# Patient Record
Sex: Male | Born: 1955
Health system: Southern US, Community
[De-identification: ages and names within clinical notes are randomized; demographics above are authoritative.]

## PROBLEM LIST (undated history)

## (undated) DIAGNOSIS — D649 Anemia, unspecified: Secondary | ICD-10-CM

## (undated) DIAGNOSIS — I2510 Atherosclerotic heart disease of native coronary artery without angina pectoris: Secondary | ICD-10-CM

## (undated) DIAGNOSIS — I4901 Ventricular fibrillation: Secondary | ICD-10-CM

## (undated) DIAGNOSIS — H9190 Unspecified hearing loss, unspecified ear: Secondary | ICD-10-CM

## (undated) DIAGNOSIS — I10 Essential (primary) hypertension: Secondary | ICD-10-CM

## (undated) DIAGNOSIS — Z8614 Personal history of Methicillin resistant Staphylococcus aureus infection: Secondary | ICD-10-CM

## (undated) DIAGNOSIS — I319 Disease of pericardium, unspecified: Secondary | ICD-10-CM

## (undated) DIAGNOSIS — E785 Hyperlipidemia, unspecified: Secondary | ICD-10-CM

## (undated) DIAGNOSIS — I255 Ischemic cardiomyopathy: Secondary | ICD-10-CM

## (undated) DIAGNOSIS — E278 Other specified disorders of adrenal gland: Secondary | ICD-10-CM

## (undated) DIAGNOSIS — Z951 Presence of aortocoronary bypass graft: Secondary | ICD-10-CM

## (undated) HISTORY — DX: Ventricular fibrillation: I49.01

## (undated) HISTORY — DX: Ischemic cardiomyopathy: I25.5

## (undated) HISTORY — DX: Personal history of Methicillin resistant Staphylococcus aureus infection: Z86.14

## (undated) HISTORY — DX: Other specified disorders of adrenal gland: E27.8

## (undated) HISTORY — DX: Essential (primary) hypertension: I10

## (undated) HISTORY — DX: Anemia, unspecified: D64.9

## (undated) HISTORY — DX: Atherosclerotic heart disease of native coronary artery without angina pectoris: I25.10

## (undated) HISTORY — PX: ANKLE SURGERY: SHX546

## (undated) HISTORY — DX: Disease of pericardium, unspecified: I31.9

## (undated) HISTORY — DX: Presence of aortocoronary bypass graft: Z95.1

## (undated) HISTORY — DX: Hyperlipidemia, unspecified: E78.5

## (undated) HISTORY — DX: Unspecified hearing loss, unspecified ear: H91.90

---

## 1998-08-14 ENCOUNTER — Encounter: Payer: Self-pay | Admitting: Orthopedic Surgery

## 1998-08-14 ENCOUNTER — Ambulatory Visit (HOSPITAL_COMMUNITY): Admission: RE | Admit: 1998-08-14 | Discharge: 1998-08-14 | Payer: Self-pay | Admitting: Orthopedic Surgery

## 2004-10-19 ENCOUNTER — Ambulatory Visit (HOSPITAL_COMMUNITY): Admission: RE | Admit: 2004-10-19 | Discharge: 2004-10-19 | Payer: Self-pay | Admitting: Family Medicine

## 2004-12-13 ENCOUNTER — Ambulatory Visit: Payer: Self-pay | Admitting: Orthopedic Surgery

## 2005-01-28 ENCOUNTER — Ambulatory Visit: Payer: Self-pay | Admitting: Orthopedic Surgery

## 2005-04-24 ENCOUNTER — Ambulatory Visit: Payer: Self-pay | Admitting: Orthopedic Surgery

## 2006-04-04 ENCOUNTER — Emergency Department (HOSPITAL_COMMUNITY): Admission: EM | Admit: 2006-04-04 | Discharge: 2006-04-04 | Payer: Self-pay | Admitting: Emergency Medicine

## 2008-08-26 ENCOUNTER — Encounter: Payer: Self-pay | Admitting: Emergency Medicine

## 2008-08-26 ENCOUNTER — Inpatient Hospital Stay (HOSPITAL_COMMUNITY): Admission: EM | Admit: 2008-08-26 | Discharge: 2008-08-30 | Payer: Self-pay | Admitting: Cardiology

## 2008-08-26 HISTORY — PX: CARDIAC CATHETERIZATION: SHX172

## 2008-08-29 ENCOUNTER — Encounter: Payer: Self-pay | Admitting: Cardiology

## 2008-09-02 ENCOUNTER — Ambulatory Visit: Payer: Self-pay | Admitting: Internal Medicine

## 2008-09-02 ENCOUNTER — Other Ambulatory Visit: Payer: Self-pay | Admitting: Emergency Medicine

## 2008-09-02 ENCOUNTER — Inpatient Hospital Stay (HOSPITAL_COMMUNITY): Admission: EM | Admit: 2008-09-02 | Discharge: 2008-09-09 | Payer: Self-pay | Admitting: Cardiology

## 2008-09-02 DIAGNOSIS — I4901 Ventricular fibrillation: Secondary | ICD-10-CM

## 2008-09-02 HISTORY — DX: Ventricular fibrillation: I49.01

## 2008-09-15 ENCOUNTER — Encounter: Admission: RE | Admit: 2008-09-15 | Discharge: 2008-09-15 | Payer: Self-pay | Admitting: Cardiology

## 2008-09-26 ENCOUNTER — Encounter (INDEPENDENT_AMBULATORY_CARE_PROVIDER_SITE_OTHER): Payer: Self-pay | Admitting: *Deleted

## 2008-09-26 ENCOUNTER — Ambulatory Visit: Payer: Self-pay

## 2008-12-14 DIAGNOSIS — I472 Ventricular tachycardia: Secondary | ICD-10-CM | POA: Insufficient documentation

## 2008-12-14 DIAGNOSIS — G931 Anoxic brain damage, not elsewhere classified: Secondary | ICD-10-CM | POA: Insufficient documentation

## 2008-12-14 DIAGNOSIS — I469 Cardiac arrest, cause unspecified: Secondary | ICD-10-CM | POA: Insufficient documentation

## 2008-12-14 DIAGNOSIS — E876 Hypokalemia: Secondary | ICD-10-CM

## 2008-12-14 DIAGNOSIS — I10 Essential (primary) hypertension: Secondary | ICD-10-CM | POA: Insufficient documentation

## 2008-12-14 DIAGNOSIS — I2510 Atherosclerotic heart disease of native coronary artery without angina pectoris: Secondary | ICD-10-CM | POA: Insufficient documentation

## 2008-12-14 DIAGNOSIS — E7800 Pure hypercholesterolemia, unspecified: Secondary | ICD-10-CM

## 2008-12-14 DIAGNOSIS — I509 Heart failure, unspecified: Secondary | ICD-10-CM | POA: Insufficient documentation

## 2008-12-14 DIAGNOSIS — I5022 Chronic systolic (congestive) heart failure: Secondary | ICD-10-CM

## 2008-12-14 DIAGNOSIS — F17200 Nicotine dependence, unspecified, uncomplicated: Secondary | ICD-10-CM

## 2008-12-16 ENCOUNTER — Ambulatory Visit: Payer: Self-pay | Admitting: Internal Medicine

## 2008-12-16 ENCOUNTER — Encounter: Payer: Self-pay | Admitting: Internal Medicine

## 2009-01-16 ENCOUNTER — Encounter: Payer: Self-pay | Admitting: Internal Medicine

## 2009-02-19 ENCOUNTER — Emergency Department (HOSPITAL_COMMUNITY): Admission: EM | Admit: 2009-02-19 | Discharge: 2009-02-19 | Payer: Self-pay | Admitting: Emergency Medicine

## 2009-02-20 ENCOUNTER — Inpatient Hospital Stay (HOSPITAL_COMMUNITY): Admission: EM | Admit: 2009-02-20 | Discharge: 2009-03-02 | Payer: Self-pay | Admitting: Emergency Medicine

## 2009-02-21 ENCOUNTER — Ambulatory Visit: Payer: Self-pay | Admitting: Internal Medicine

## 2009-02-21 ENCOUNTER — Ambulatory Visit: Payer: Self-pay | Admitting: Cardiology

## 2009-02-21 ENCOUNTER — Encounter (INDEPENDENT_AMBULATORY_CARE_PROVIDER_SITE_OTHER): Payer: Self-pay | Admitting: Internal Medicine

## 2009-02-22 ENCOUNTER — Encounter: Payer: Self-pay | Admitting: Internal Medicine

## 2009-02-22 ENCOUNTER — Ambulatory Visit: Payer: Self-pay | Admitting: Oncology

## 2009-02-22 ENCOUNTER — Ambulatory Visit: Payer: Self-pay | Admitting: Infectious Disease

## 2009-02-23 HISTORY — PX: OTHER SURGICAL HISTORY: SHX169

## 2009-03-11 ENCOUNTER — Emergency Department (HOSPITAL_COMMUNITY): Admission: EM | Admit: 2009-03-11 | Discharge: 2009-03-11 | Payer: Self-pay | Admitting: Emergency Medicine

## 2009-03-13 ENCOUNTER — Encounter: Payer: Self-pay | Admitting: Infectious Disease

## 2009-03-16 ENCOUNTER — Inpatient Hospital Stay (HOSPITAL_COMMUNITY): Admission: EM | Admit: 2009-03-16 | Discharge: 2009-03-19 | Payer: Self-pay | Admitting: Cardiology

## 2009-03-16 ENCOUNTER — Encounter (INDEPENDENT_AMBULATORY_CARE_PROVIDER_SITE_OTHER): Payer: Self-pay | Admitting: Cardiology

## 2009-03-16 ENCOUNTER — Encounter: Payer: Self-pay | Admitting: Emergency Medicine

## 2009-03-23 ENCOUNTER — Encounter: Payer: Self-pay | Admitting: Internal Medicine

## 2009-03-28 ENCOUNTER — Encounter: Payer: Self-pay | Admitting: Infectious Disease

## 2009-03-29 ENCOUNTER — Telehealth: Payer: Self-pay | Admitting: Infectious Disease

## 2009-04-03 ENCOUNTER — Encounter: Payer: Self-pay | Admitting: Infectious Disease

## 2009-04-04 ENCOUNTER — Telehealth: Payer: Self-pay | Admitting: Infectious Disease

## 2009-04-05 ENCOUNTER — Encounter: Payer: Self-pay | Admitting: Infectious Disease

## 2009-04-05 DIAGNOSIS — R7881 Bacteremia: Secondary | ICD-10-CM

## 2009-04-05 DIAGNOSIS — I309 Acute pericarditis, unspecified: Secondary | ICD-10-CM

## 2009-04-06 ENCOUNTER — Encounter: Payer: Self-pay | Admitting: Infectious Disease

## 2009-04-17 ENCOUNTER — Ambulatory Visit: Payer: Self-pay | Admitting: Internal Medicine

## 2009-04-17 LAB — CONVERTED CEMR LAB
Eosinophils Absolute: 0.2 10*3/uL (ref 0.0–0.7)
Eosinophils Relative: 1.6 % (ref 0.0–5.0)
HCT: 37.6 % — ABNORMAL LOW (ref 39.0–52.0)
Lymphs Abs: 2.1 10*3/uL (ref 0.7–4.0)
MCHC: 33.5 g/dL (ref 30.0–36.0)
MCV: 94.5 fL (ref 78.0–100.0)
Monocytes Absolute: 0.4 10*3/uL (ref 0.1–1.0)
Platelets: 408 10*3/uL — ABNORMAL HIGH (ref 150.0–400.0)
WBC: 10.6 10*3/uL — ABNORMAL HIGH (ref 4.5–10.5)

## 2009-04-18 ENCOUNTER — Encounter: Payer: Self-pay | Admitting: Internal Medicine

## 2009-04-25 ENCOUNTER — Encounter: Payer: Self-pay | Admitting: Internal Medicine

## 2009-04-25 ENCOUNTER — Encounter: Payer: Self-pay | Admitting: Infectious Disease

## 2009-04-27 ENCOUNTER — Ambulatory Visit: Payer: Self-pay | Admitting: Infectious Disease

## 2009-04-27 DIAGNOSIS — A4101 Sepsis due to Methicillin susceptible Staphylococcus aureus: Secondary | ICD-10-CM

## 2009-04-27 DIAGNOSIS — T827XXA Infection and inflammatory reaction due to other cardiac and vascular devices, implants and grafts, initial encounter: Secondary | ICD-10-CM

## 2009-05-05 ENCOUNTER — Ambulatory Visit: Payer: Self-pay | Admitting: Internal Medicine

## 2009-05-05 LAB — CONVERTED CEMR LAB
BUN: 10 mg/dL (ref 6–23)
Calcium: 9.3 mg/dL (ref 8.4–10.5)
GFR calc non Af Amer: 108.09 mL/min (ref >60)
Glucose, Bld: 83 mg/dL (ref 70–99)
HCT: 39.4 % (ref 39.0–52.0)
INR: 1.1 — ABNORMAL HIGH (ref 0.8–1.0)
MCV: 93.3 fL (ref 78.0–100.0)
Platelets: 321 10*3/uL (ref 150.0–400.0)
RDW: 14.4 % (ref 11.5–14.6)
Sodium: 140 meq/L (ref 135–145)
aPTT: 32.2 s — ABNORMAL HIGH (ref 21.7–28.8)

## 2009-05-09 ENCOUNTER — Ambulatory Visit: Payer: Self-pay | Admitting: Internal Medicine

## 2009-05-09 ENCOUNTER — Inpatient Hospital Stay (HOSPITAL_COMMUNITY): Admission: RE | Admit: 2009-05-09 | Discharge: 2009-05-10 | Payer: Self-pay | Admitting: Internal Medicine

## 2009-05-09 ENCOUNTER — Encounter: Payer: Self-pay | Admitting: Infectious Disease

## 2009-05-09 HISTORY — PX: OTHER SURGICAL HISTORY: SHX169

## 2009-05-10 ENCOUNTER — Encounter: Payer: Self-pay | Admitting: Internal Medicine

## 2009-05-24 ENCOUNTER — Encounter: Payer: Self-pay | Admitting: Internal Medicine

## 2009-05-24 ENCOUNTER — Ambulatory Visit: Payer: Self-pay

## 2009-07-27 ENCOUNTER — Encounter: Payer: Self-pay | Admitting: Internal Medicine

## 2009-07-28 ENCOUNTER — Encounter: Payer: Self-pay | Admitting: Internal Medicine

## 2009-08-09 ENCOUNTER — Ambulatory Visit: Payer: Self-pay | Admitting: Internal Medicine

## 2009-11-17 ENCOUNTER — Encounter (INDEPENDENT_AMBULATORY_CARE_PROVIDER_SITE_OTHER): Payer: Self-pay | Admitting: *Deleted

## 2010-02-28 IMAGING — CR DG CHEST 1V PORT
1 series · 1 of 1 positions shown · non-contrast
Comparison: Radiograph 09/02/2008

CLINICAL DATA: Cardiac arrest

PORTABLE CHEST - 1 VIEW

[view not recorded]
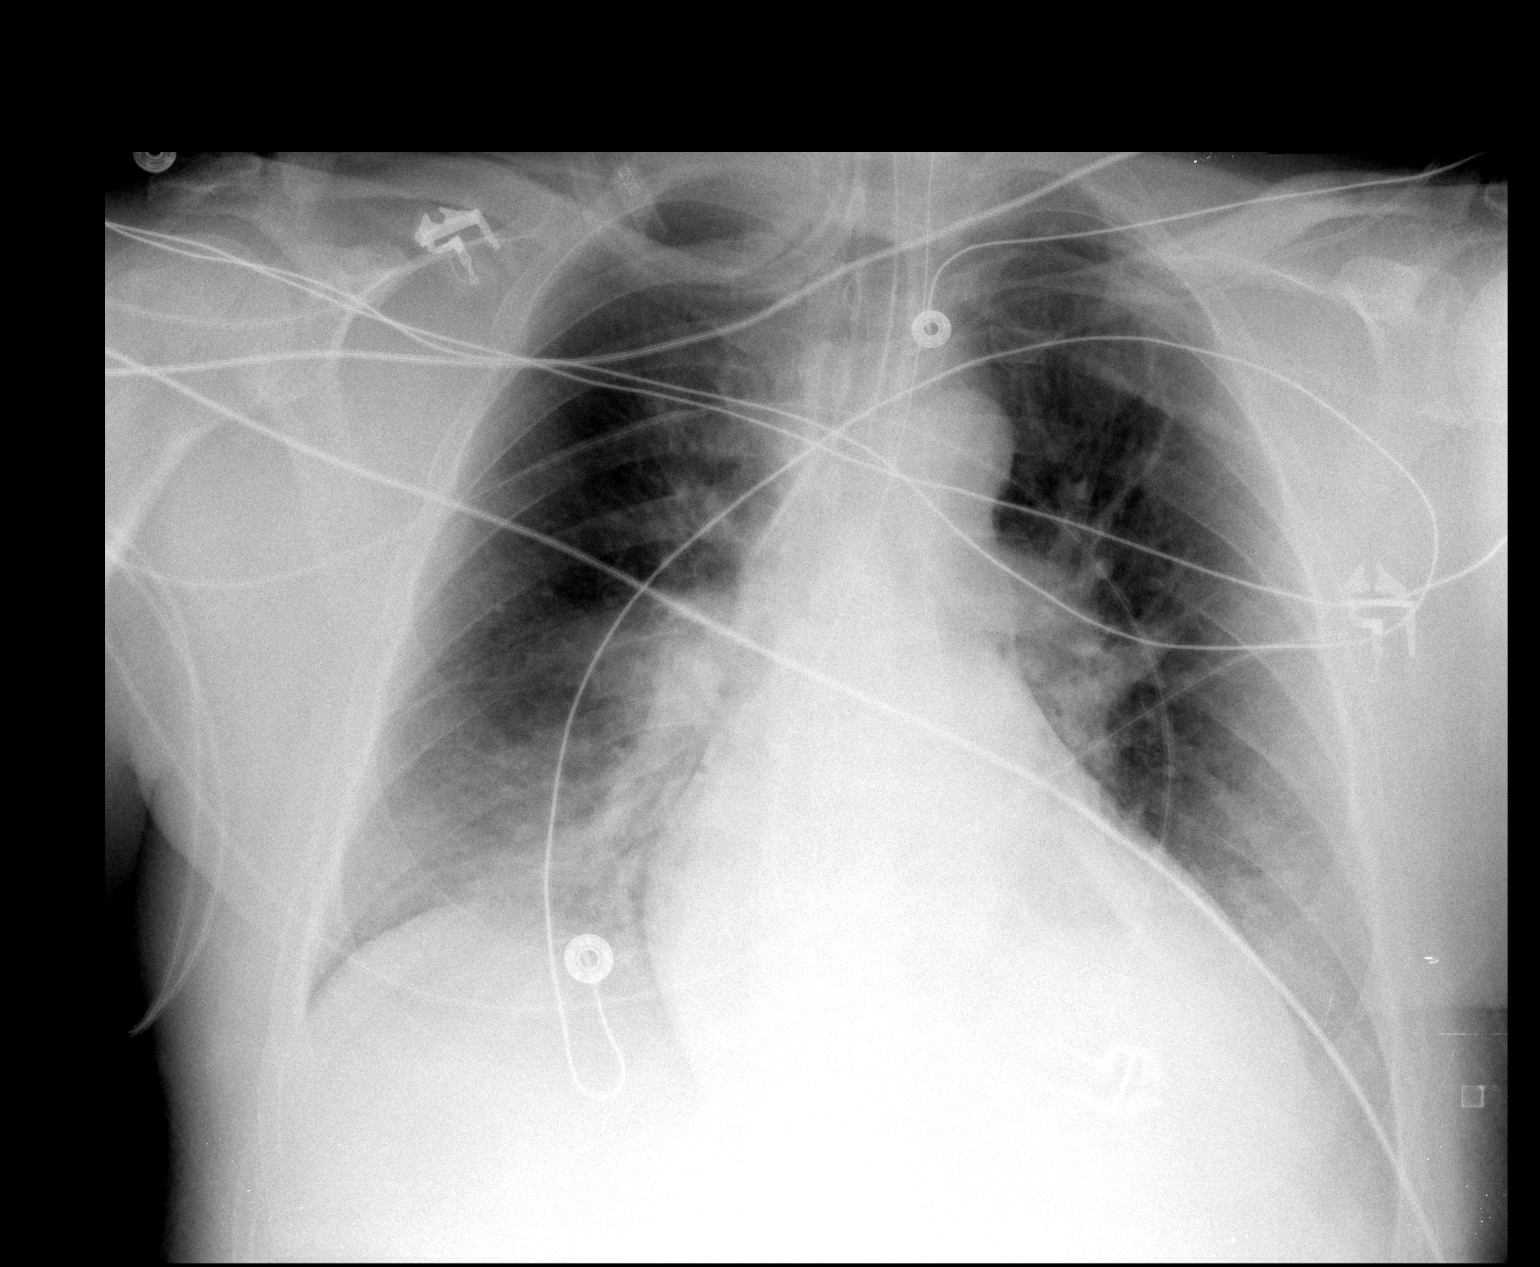

[1 of 1 positions shown; findings below may reference images not displayed]

FINDINGS: Endotracheal tube 2.7 cm from carina in good position.
No change in left central venous line.  Stable mildly enlarged
cardiac silhouette.  There is increase in left lower lobe
atelectasis compared to prior.  There is bilateral lower lobe air
space disease which is increased from prior.  Central venous
congestion appears similar.
IMPRESSION: 1.  Stable support apparatus.
2.  Increase in left lower lobe atelectasis and left effusion.
3.  Bibasilar air space disease likely representing pulmonary edema
has increased.

## 2010-05-25 ENCOUNTER — Encounter (INDEPENDENT_AMBULATORY_CARE_PROVIDER_SITE_OTHER): Payer: Self-pay | Admitting: *Deleted

## 2010-06-14 ENCOUNTER — Encounter: Payer: Self-pay | Admitting: Internal Medicine

## 2010-08-19 IMAGING — CR DG CHEST 2V
2 series · 2 of 2 positions shown · non-contrast
Comparison: Portable chest 02/19/2009 and two-view chest 09/09/2008

CLINICAL DATA: Chest pain/fever

CHEST - 2 VIEW

[w chest pa]
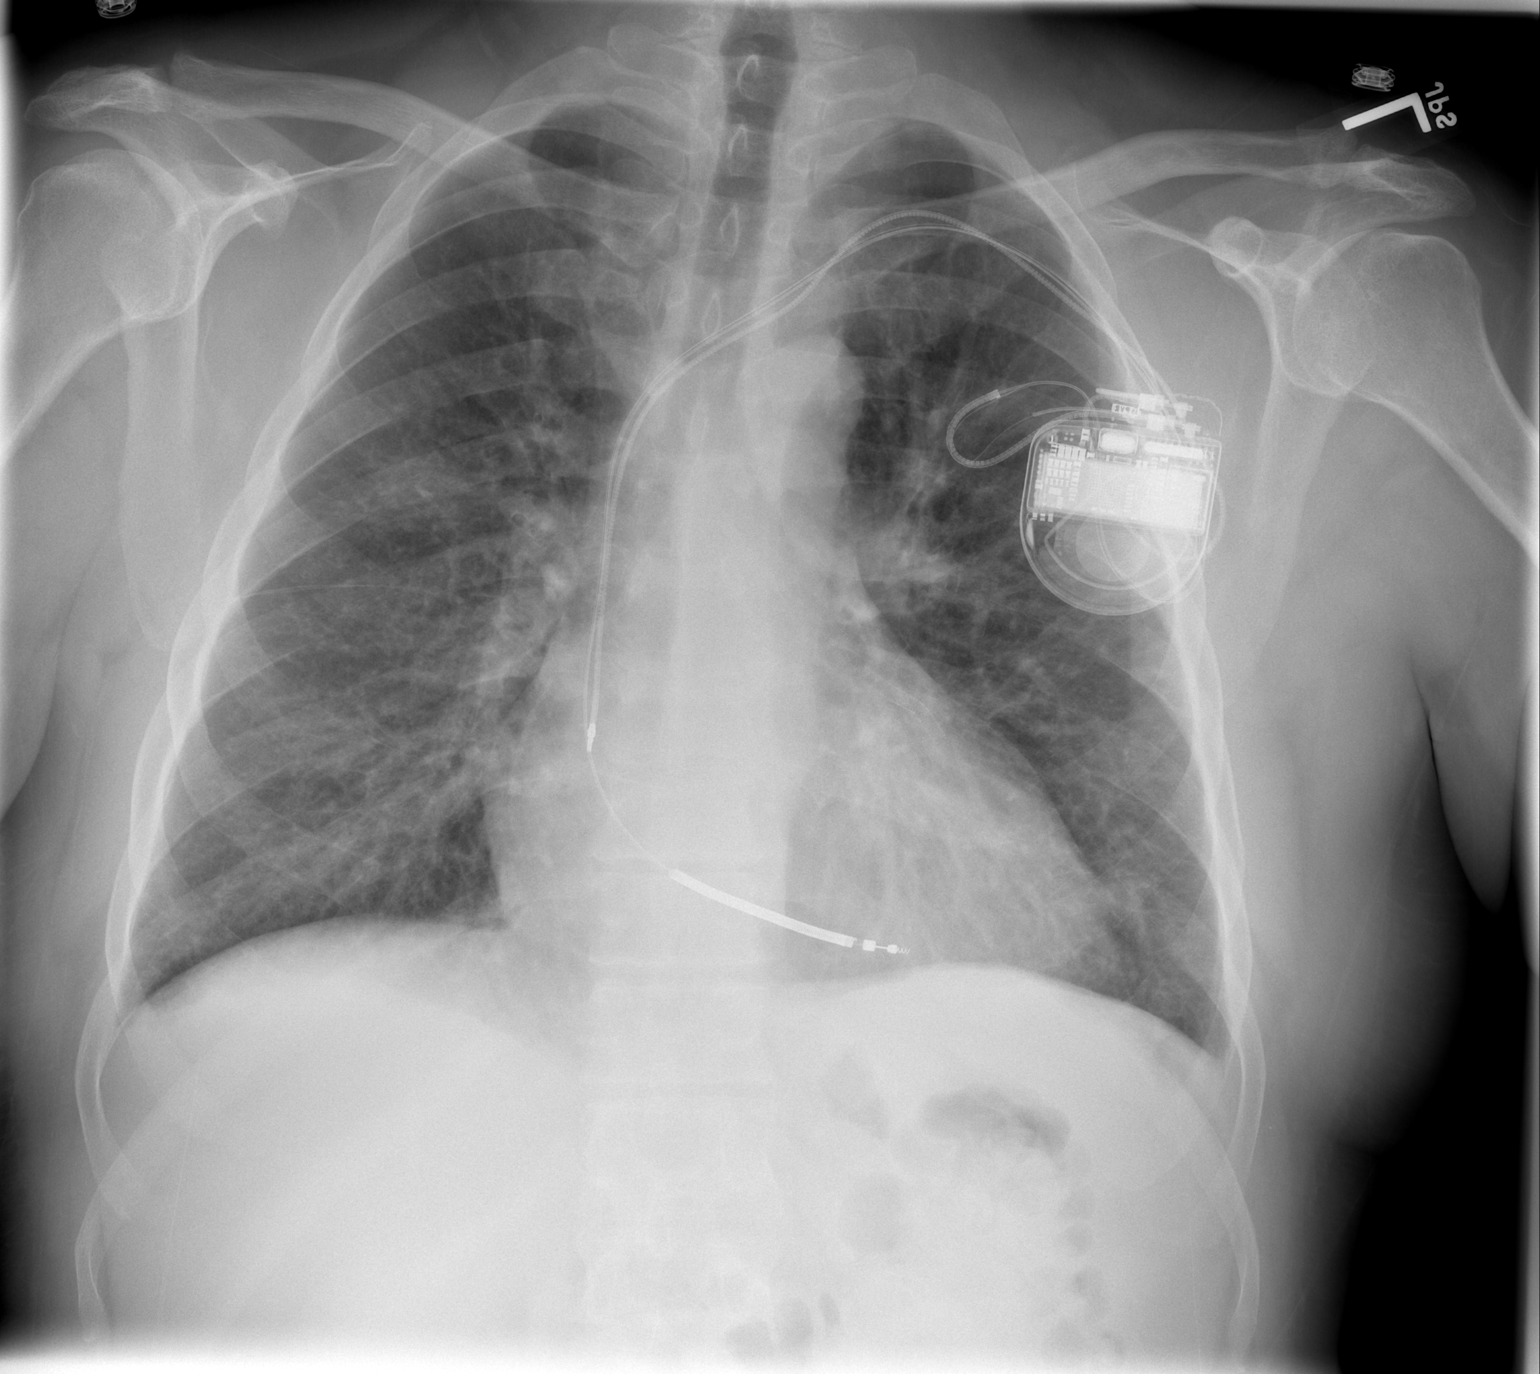

[w chest lat]
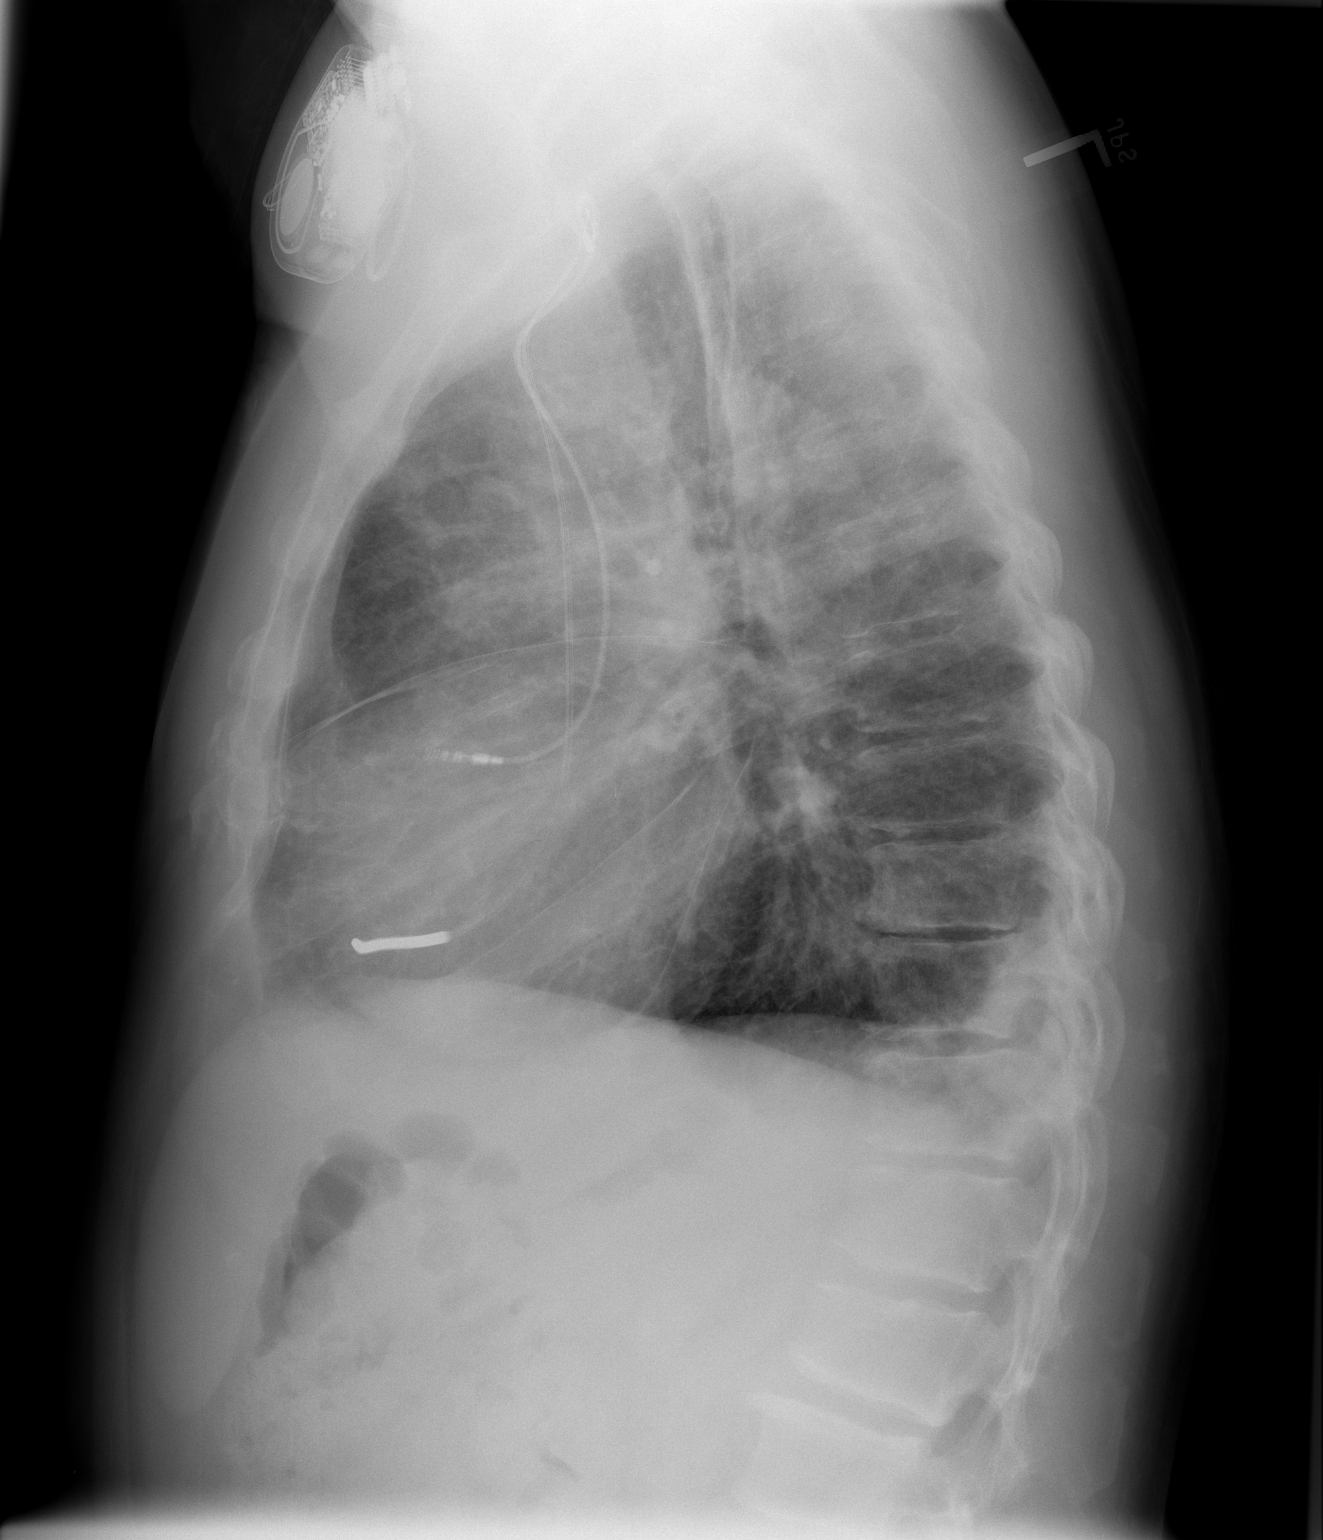

[2 of 2 positions shown; findings below may reference images not displayed]

FINDINGS: Heart mildly enlarged.  Dual lead permanent cardiac pacer
unchanged.  There is no congestive heart failure.  There is
peribronchial thickening.  There is a question of a subtle right
upper lobe density noted on the PA view.  This may be visible in
the anterior aspect of the right upper lobe on the lateral view.
This appears to represent a significant interval change compared to
09/09/2008.  I would regard this as suspicious for early pneumonia.
IMPRESSION: 1.  Mild cardiomegaly and dual lead permanent cardiac pacer.  No
definite heart failure.
2.  Bronchitic changes.
3.  Question of subtle right upper lobe airspace infiltrate - see
above.

## 2010-09-25 NOTE — Letter (Signed)
Summary: Appointment - Missed  [facility], Main Office  [address]   [place], [place] [address]   Phone: [phone]  Fax: [phone]     November 17, 2009 MRN: [id number]   JARIOUS LYON [address] [place], [place]  [address]   Dear Mr. ROSAS,  Our records indicate you missed your appointment on 11/06/09 with [facility]. It is very important that we reach you to reschedule this appointment. We look forward to participating in your health care needs. Please contact us at the number listed above at your earliest convenience to reschedule this appointment.     Sincerely,   Ruel Favors Scheduling Team

## 2010-09-25 NOTE — Letter (Signed)
Summary: Device-Delinquent Check  [facility], Main Office  [address]   [place], [place] [address]   Phone: [phone]  Fax: [phone]     May 25, 2010 MRN: [id number]   MAAN ZARCONE [address] [place], [place]  [address]   Dear Mr. POOLER,  According to our records, you have not had your implanted device checked in the recommended period of time.  We are unable to determine appropriate device function without checking your device on a regular basis.  Please call our office to schedule an appointment  with Dr Johney Frame , as soon as possible.  If you are having your device checked by another physician, please call us so that we may update our records.  Thank you,  Letta Moynahan, EMT  May 25, 2010 3:03 PM  [facility]  certified

## 2010-09-25 NOTE — Consult Note (Signed)
Summary: [facility]  [facility]   Imported By: Marylou Mccoy 09/05/2009 13:45:59  _____________________________________________________________________  External Attachment:    Type:   Image     Comment:   External Document

## 2010-09-25 NOTE — Cardiovascular Report (Signed)
Summary: Certified Letter Signed - Patient (not doing f/u)  Certified Letter Signed - Patient (not doing f/u)   Imported By: Debby Freiberg 06/29/2010 11:58:15  _____________________________________________________________________  External Attachment:    Type:   Image     Comment:   External Document

## 2010-12-02 LAB — BASIC METABOLIC PANEL
BUN: 15 mg/dL (ref 6–23)
BUN: 11 mg/dL (ref 6–23)
BUN: 7 mg/dL (ref 6–23)
CO2: 26 mEq/L (ref 19–32)
CO2: 26 mEq/L (ref 19–32)
Calcium: 8.3 mg/dL — ABNORMAL LOW (ref 8.4–10.5)
Chloride: 104 mEq/L (ref 96–112)
Chloride: 103 mEq/L (ref 96–112)
Chloride: 98 mEq/L (ref 96–112)
Creatinine, Ser: 0.96 mg/dL (ref 0.4–1.5)
Creatinine, Ser: 0.73 mg/dL (ref 0.4–1.5)
GFR calc Af Amer: >60 mL/min (ref >60)
GFR calc Af Amer: >60 mL/min (ref >60)
GFR calc non Af Amer: >60 mL/min (ref >60)
GFR calc non Af Amer: >60 mL/min (ref >60)
Glucose, Bld: 97 mg/dL (ref 70–99)
Glucose, Bld: 105 mg/dL — ABNORMAL HIGH (ref 70–99)
Potassium: 4.3 mEq/L (ref 3.5–5.1)
Potassium: 3.3 mEq/L — ABNORMAL LOW (ref 3.5–5.1)
Potassium: 4.6 mEq/L (ref 3.5–5.1)
Sodium: 136 mEq/L (ref 135–145)
Sodium: 135 mEq/L (ref 135–145)

## 2010-12-02 LAB — POCT CARDIAC MARKERS
CKMB, poc: 1.6 ng/mL (ref 1.0–8.0)
Myoglobin, poc: 18.9 ng/mL (ref 12–200)

## 2010-12-02 LAB — POCT I-STAT, CHEM 8
HCT: 32 % — ABNORMAL LOW (ref 39.0–52.0)
Hemoglobin: 10.9 g/dL — ABNORMAL LOW (ref 13.0–17.0)
Potassium: 4.1 mEq/L (ref 3.5–5.1)
Sodium: 137 mEq/L (ref 135–145)
TCO2: 23 mmol/L (ref 0–100)

## 2010-12-02 LAB — CBC
HCT: 30.8 % — ABNORMAL LOW (ref 39.0–52.0)
HCT: 33.7 % — ABNORMAL LOW (ref 39.0–52.0)
HCT: 31.8 % — ABNORMAL LOW (ref 39.0–52.0)
HCT: 32.3 % — ABNORMAL LOW (ref 39.0–52.0)
HCT: 32.8 % — ABNORMAL LOW (ref 39.0–52.0)
HCT: 34 % — ABNORMAL LOW (ref 39.0–52.0)
HCT: 34.4 % — ABNORMAL LOW (ref 39.0–52.0)
Hemoglobin: 10.9 g/dL — ABNORMAL LOW (ref 13.0–17.0)
Hemoglobin: 11.1 g/dL — ABNORMAL LOW (ref 13.0–17.0)
Hemoglobin: 11.7 g/dL — ABNORMAL LOW (ref 13.0–17.0)
Hemoglobin: 10.4 g/dL — ABNORMAL LOW (ref 13.0–17.0)
Hemoglobin: 11.2 g/dL — ABNORMAL LOW (ref 13.0–17.0)
Hemoglobin: 11.3 g/dL — ABNORMAL LOW (ref 13.0–17.0)
Hemoglobin: 11.9 g/dL — ABNORMAL LOW (ref 13.0–17.0)
MCHC: 34.6 g/dL (ref 30.0–36.0)
MCHC: 34.5 g/dL (ref 30.0–36.0)
MCHC: 34.7 g/dL (ref 30.0–36.0)
MCHC: 34.9 g/dL (ref 30.0–36.0)
MCHC: 35.3 g/dL (ref 30.0–36.0)
MCV: 94.9 fL (ref 78.0–100.0)
MCV: 95.5 fL (ref 78.0–100.0)
MCV: 92.8 fL (ref 78.0–100.0)
MCV: 93.1 fL (ref 78.0–100.0)
MCV: 93.5 fL (ref 78.0–100.0)
MCV: 94.3 fL (ref 78.0–100.0)
MCV: 94.5 fL (ref 78.0–100.0)
Platelets: 359 10*3/uL (ref 150–400)
Platelets: 102 10*3/uL — ABNORMAL LOW (ref 150–400)
Platelets: 19 10*3/uL — CL (ref 150–400)
Platelets: 299 10*3/uL (ref 150–400)
Platelets: 40 10*3/uL — CL (ref 150–400)
Platelets: 401 10*3/uL — ABNORMAL HIGH (ref 150–400)
RBC: 3.25 MIL/uL — ABNORMAL LOW (ref 4.22–5.81)
RBC: 3.43 MIL/uL — ABNORMAL LOW (ref 4.22–5.81)
RBC: 3.53 MIL/uL — ABNORMAL LOW (ref 4.22–5.81)
RBC: 3.18 MIL/uL — ABNORMAL LOW (ref 4.22–5.81)
RBC: 3.26 MIL/uL — ABNORMAL LOW (ref 4.22–5.81)
RBC: 3.37 MIL/uL — ABNORMAL LOW (ref 4.22–5.81)
RBC: 3.49 MIL/uL — ABNORMAL LOW (ref 4.22–5.81)
RBC: 3.64 MIL/uL — ABNORMAL LOW (ref 4.22–5.81)
RDW: 14.8 % (ref 11.5–15.5)
RDW: 14.8 % (ref 11.5–15.5)
RDW: 14.6 % (ref 11.5–15.5)
RDW: 14.9 % (ref 11.5–15.5)
WBC: 11.2 10*3/uL — ABNORMAL HIGH (ref 4.0–10.5)
WBC: 11.8 10*3/uL — ABNORMAL HIGH (ref 4.0–10.5)
WBC: 12 10*3/uL — ABNORMAL HIGH (ref 4.0–10.5)
WBC: 11.1 10*3/uL — ABNORMAL HIGH (ref 4.0–10.5)
WBC: 13.6 10*3/uL — ABNORMAL HIGH (ref 4.0–10.5)
WBC: 13.9 10*3/uL — ABNORMAL HIGH (ref 4.0–10.5)
WBC: 17.8 10*3/uL — ABNORMAL HIGH (ref 4.0–10.5)
WBC: 18.6 10*3/uL — ABNORMAL HIGH (ref 4.0–10.5)

## 2010-12-02 LAB — CARDIAC PANEL(CRET KIN+CKTOT+MB+TROPI)
CK, MB: 0.8 ng/mL (ref 0.3–4.0)
CK, MB: 1 ng/mL (ref 0.3–4.0)
CK, MB: 1.3 ng/mL (ref 0.3–4.0)
Relative Index: INVALID (ref 0.0–2.5)
Total CK: 22 U/L (ref 7–232)
Total CK: 25 U/L (ref 7–232)
Troponin I: 0.02 ng/mL (ref 0.00–0.06)
Troponin I: 0.06 ng/mL (ref 0.00–0.06)

## 2010-12-02 LAB — APTT: aPTT: 44 seconds — ABNORMAL HIGH (ref 24–37)

## 2010-12-02 LAB — COMPREHENSIVE METABOLIC PANEL
ALT: 10 U/L (ref 0–53)
ALT: 18 U/L (ref 0–53)
AST: 20 U/L (ref 0–37)
AST: 50 U/L — ABNORMAL HIGH (ref 0–37)
Albumin: 2 g/dL — ABNORMAL LOW (ref 3.5–5.2)
Albumin: 2.1 g/dL — ABNORMAL LOW (ref 3.5–5.2)
Alkaline Phosphatase: 140 U/L — ABNORMAL HIGH (ref 39–117)
Alkaline Phosphatase: 110 U/L (ref 39–117)
BUN: 10 mg/dL (ref 6–23)
BUN: 9 mg/dL (ref 6–23)
CO2: 22 mEq/L (ref 19–32)
CO2: 26 mEq/L (ref 19–32)
CO2: 29 mEq/L (ref 19–32)
Calcium: 8.5 mg/dL (ref 8.4–10.5)
Calcium: 8.2 mg/dL — ABNORMAL LOW (ref 8.4–10.5)
Chloride: 101 mEq/L (ref 96–112)
Chloride: 101 mEq/L (ref 96–112)
Creatinine, Ser: 0.75 mg/dL (ref 0.4–1.5)
Creatinine, Ser: 0.91 mg/dL (ref 0.4–1.5)
GFR calc Af Amer: >60 mL/min (ref >60)
GFR calc non Af Amer: >60 mL/min (ref >60)
Glucose, Bld: 123 mg/dL — ABNORMAL HIGH (ref 70–99)
Glucose, Bld: 108 mg/dL — ABNORMAL HIGH (ref 70–99)
Glucose, Bld: 127 mg/dL — ABNORMAL HIGH (ref 70–99)
Potassium: 4.5 mEq/L (ref 3.5–5.1)
Potassium: 3.6 mEq/L (ref 3.5–5.1)
Potassium: 3.6 mEq/L (ref 3.5–5.1)
Sodium: 134 mEq/L — ABNORMAL LOW (ref 135–145)
Total Bilirubin: 1.7 mg/dL — ABNORMAL HIGH (ref 0.3–1.2)
Total Bilirubin: 3.7 mg/dL — ABNORMAL HIGH (ref 0.3–1.2)
Total Protein: 7.8 g/dL (ref 6.0–8.3)

## 2010-12-02 LAB — URINE CULTURE: Culture: NO GROWTH

## 2010-12-02 LAB — HEMOGLOBIN A1C
Hgb A1c MFr Bld: 5.2 % (ref 4.6–6.1)
Mean Plasma Glucose: 103 mg/dL

## 2010-12-02 LAB — DIFFERENTIAL
Basophils Absolute: 0.1 10*3/uL (ref 0.0–0.1)
Basophils Absolute: 0 10*3/uL (ref 0.0–0.1)
Basophils Absolute: 0 10*3/uL (ref 0.0–0.1)
Basophils Relative: 0 % (ref 0–1)
Eosinophils Absolute: 0.1 10*3/uL (ref 0.0–0.7)
Eosinophils Absolute: 0.4 10*3/uL (ref 0.0–0.7)
Eosinophils Relative: 1 % (ref 0–5)
Eosinophils Relative: 1 % (ref 0–5)
Eosinophils Relative: 3 % (ref 0–5)
Lymphocytes Relative: 13 % (ref 12–46)
Lymphocytes Relative: 17 % (ref 12–46)
Lymphocytes Relative: 10 % — ABNORMAL LOW (ref 12–46)
Lymphocytes Relative: 10 % — ABNORMAL LOW (ref 12–46)
Lymphs Abs: 2.2 10*3/uL (ref 0.7–4.0)
Lymphs Abs: 1.4 10*3/uL (ref 0.7–4.0)
Lymphs Abs: 1.9 10*3/uL (ref 0.7–4.0)
Monocytes Absolute: 0.9 10*3/uL (ref 0.1–1.0)
Monocytes Absolute: 0.5 10*3/uL (ref 0.1–1.0)
Monocytes Absolute: 0.6 10*3/uL (ref 0.1–1.0)
Monocytes Absolute: 0.9 10*3/uL (ref 0.1–1.0)
Monocytes Absolute: 1.3 10*3/uL — ABNORMAL HIGH (ref 0.1–1.0)
Monocytes Relative: 5 % (ref 3–12)
Monocytes Relative: 5 % (ref 3–12)
Monocytes Relative: 6 % (ref 3–12)
Monocytes Relative: 7 % (ref 3–12)
Neutro Abs: 13.2 10*3/uL — ABNORMAL HIGH (ref 1.7–7.7)
Neutro Abs: 8.6 10*3/uL — ABNORMAL HIGH (ref 1.7–7.7)
Neutro Abs: 15 10*3/uL — ABNORMAL HIGH (ref 1.7–7.7)
Neutro Abs: 9.3 10*3/uL — ABNORMAL HIGH (ref 1.7–7.7)
Neutrophils Relative %: 79 % — ABNORMAL HIGH (ref 43–77)

## 2010-12-02 LAB — WOUND CULTURE

## 2010-12-02 LAB — CULTURE, BLOOD (ROUTINE X 2): Culture: NO GROWTH

## 2010-12-02 LAB — URINALYSIS, MICROSCOPIC ONLY
Glucose, UA: NEGATIVE mg/dL
Hgb urine dipstick: NEGATIVE
Ketones, ur: NEGATIVE mg/dL
Leukocytes, UA: NEGATIVE
pH: 6 (ref 5.0–8.0)

## 2010-12-02 LAB — POCT I-STAT 3, ART BLOOD GAS (G3+)
Acid-base deficit: 1 mmol/L (ref 0.0–2.0)
O2 Saturation: 100 %
pCO2 arterial: 40.5 mmHg (ref 35.0–45.0)
pO2, Arterial: 321 mmHg — ABNORMAL HIGH (ref 80.0–100.0)

## 2010-12-02 LAB — SEDIMENTATION RATE: Sed Rate: 121 mm/hr — ABNORMAL HIGH (ref 0–16)

## 2010-12-02 LAB — URINALYSIS, ROUTINE W REFLEX MICROSCOPIC
Glucose, UA: NEGATIVE mg/dL
Ketones, ur: NEGATIVE mg/dL
pH: 6.5 (ref 5.0–8.0)

## 2010-12-02 LAB — PROTIME-INR: Prothrombin Time: 15.2 seconds (ref 11.6–15.2)

## 2010-12-02 LAB — URINE MICROSCOPIC-ADD ON

## 2010-12-03 LAB — BASIC METABOLIC PANEL
BUN: 16 mg/dL (ref 6–23)
CO2: 22 mEq/L (ref 19–32)
Chloride: 99 mEq/L (ref 96–112)
Chloride: 99 mEq/L (ref 96–112)
Creatinine, Ser: 1.07 mg/dL (ref 0.4–1.5)
Creatinine, Ser: 1.09 mg/dL (ref 0.4–1.5)
GFR calc Af Amer: >60 mL/min (ref >60)
Potassium: 3.3 mEq/L — ABNORMAL LOW (ref 3.5–5.1)
Sodium: 130 mEq/L — ABNORMAL LOW (ref 135–145)

## 2010-12-03 LAB — TYPE AND SCREEN

## 2010-12-03 LAB — DIFFERENTIAL
Basophils Absolute: 0 10*3/uL (ref 0.0–0.1)
Basophils Relative: 0 % (ref 0–1)
Eosinophils Absolute: 0 10*3/uL (ref 0.0–0.7)
Eosinophils Absolute: 0 10*3/uL (ref 0.0–0.7)
Lymphocytes Relative: 3 % — ABNORMAL LOW (ref 12–46)
Lymphs Abs: 0.2 10*3/uL — ABNORMAL LOW (ref 0.7–4.0)
Lymphs Abs: 0.3 10*3/uL — ABNORMAL LOW (ref 0.7–4.0)
Monocytes Relative: 1 % — ABNORMAL LOW (ref 3–12)
Neutro Abs: 7.4 10*3/uL (ref 1.7–7.7)
Neutrophils Relative %: 97 % — ABNORMAL HIGH (ref 43–77)

## 2010-12-03 LAB — COMPREHENSIVE METABOLIC PANEL
ALT: 40 U/L (ref 0–53)
AST: 53 U/L — ABNORMAL HIGH (ref 0–37)
Albumin: 2.3 g/dL — ABNORMAL LOW (ref 3.5–5.2)
Albumin: 2.4 g/dL — ABNORMAL LOW (ref 3.5–5.2)
Alkaline Phosphatase: 125 U/L — ABNORMAL HIGH (ref 39–117)
BUN: 11 mg/dL (ref 6–23)
BUN: 12 mg/dL (ref 6–23)
Calcium: 8.1 mg/dL — ABNORMAL LOW (ref 8.4–10.5)
GFR calc Af Amer: >60 mL/min (ref >60)
Glucose, Bld: 110 mg/dL — ABNORMAL HIGH (ref 70–99)
Potassium: 3.6 mEq/L (ref 3.5–5.1)
Sodium: 129 mEq/L — ABNORMAL LOW (ref 135–145)
Sodium: 133 mEq/L — ABNORMAL LOW (ref 135–145)
Total Protein: 5.6 g/dL — ABNORMAL LOW (ref 6.0–8.3)
Total Protein: 5.9 g/dL — ABNORMAL LOW (ref 6.0–8.3)

## 2010-12-03 LAB — CARDIAC PANEL(CRET KIN+CKTOT+MB+TROPI)
CK, MB: 1.2 ng/mL (ref 0.3–4.0)
CK, MB: 1.6 ng/mL (ref 0.3–4.0)
Relative Index: INVALID (ref 0.0–2.5)
Total CK: 29 U/L (ref 7–232)
Troponin I: 0.06 ng/mL (ref 0.00–0.06)
Troponin I: 0.08 ng/mL — ABNORMAL HIGH (ref 0.00–0.06)

## 2010-12-03 LAB — ABO/RH: ABO/RH(D): A POS

## 2010-12-03 LAB — CBC
Hemoglobin: 12.6 g/dL — ABNORMAL LOW (ref 13.0–17.0)
Hemoglobin: 13.4 g/dL (ref 13.0–17.0)
Hemoglobin: 13.8 g/dL (ref 13.0–17.0)
MCHC: 35.2 g/dL (ref 30.0–36.0)
MCV: 95 fL (ref 78.0–100.0)
Platelets: 22 10*3/uL — CL (ref 150–400)
Platelets: 32 10*3/uL — CL (ref 150–400)
RBC: 3.99 MIL/uL — ABNORMAL LOW (ref 4.22–5.81)
RBC: 4.2 MIL/uL — ABNORMAL LOW (ref 4.22–5.81)
RDW: 13.9 % (ref 11.5–15.5)
RDW: 14.6 % (ref 11.5–15.5)
WBC: 15.4 10*3/uL — ABNORMAL HIGH (ref 4.0–10.5)

## 2010-12-03 LAB — CK TOTAL AND CKMB (NOT AT ARMC)
CK, MB: 1.5 ng/mL (ref 0.3–4.0)
Total CK: 156 U/L (ref 7–232)

## 2010-12-03 LAB — POCT CARDIAC MARKERS: Troponin i, poc: <0.05 ng/mL (ref 0.00–0.09)

## 2010-12-03 LAB — TROPONIN I: Troponin I: 0.04 ng/mL (ref 0.00–0.06)

## 2010-12-03 LAB — HEPATITIS PANEL, ACUTE
Hep A IgM: NEGATIVE
Hep B C IgM: NEGATIVE
Hepatitis B Surface Ag: NEGATIVE

## 2010-12-03 LAB — HEPATIC FUNCTION PANEL
Indirect Bilirubin: 0.9 mg/dL (ref 0.3–0.9)
Total Protein: 6.3 g/dL (ref 6.0–8.3)

## 2010-12-03 LAB — CULTURE, BLOOD (ROUTINE X 2)

## 2010-12-03 LAB — DIC (DISSEMINATED INTRAVASCULAR COAGULATION)PANEL
Smear Review: NONE SEEN
aPTT: 43 seconds — ABNORMAL HIGH (ref 24–37)

## 2010-12-03 LAB — SAVE SMEAR

## 2010-12-03 LAB — PREPARE PLATELETS

## 2010-12-03 LAB — HAPTOGLOBIN: Haptoglobin: 272 mg/dL — ABNORMAL HIGH (ref 16–200)

## 2010-12-03 LAB — LIPASE, BLOOD
Lipase: 12 U/L (ref 11–59)
Lipase: 68 U/L — ABNORMAL HIGH (ref 11–59)

## 2010-12-10 LAB — GLUCOSE, CAPILLARY
Glucose-Capillary: 100 mg/dL — ABNORMAL HIGH (ref 70–99)
Glucose-Capillary: 101 mg/dL — ABNORMAL HIGH (ref 70–99)
Glucose-Capillary: 104 mg/dL — ABNORMAL HIGH (ref 70–99)
Glucose-Capillary: 112 mg/dL — ABNORMAL HIGH (ref 70–99)
Glucose-Capillary: 114 mg/dL — ABNORMAL HIGH (ref 70–99)
Glucose-Capillary: 115 mg/dL — ABNORMAL HIGH (ref 70–99)
Glucose-Capillary: 116 mg/dL — ABNORMAL HIGH (ref 70–99)
Glucose-Capillary: 131 mg/dL — ABNORMAL HIGH (ref 70–99)
Glucose-Capillary: 140 mg/dL — ABNORMAL HIGH (ref 70–99)
Glucose-Capillary: 153 mg/dL — ABNORMAL HIGH (ref 70–99)
Glucose-Capillary: 154 mg/dL — ABNORMAL HIGH (ref 70–99)
Glucose-Capillary: 69 mg/dL — ABNORMAL LOW (ref 70–99)
Glucose-Capillary: 78 mg/dL (ref 70–99)
Glucose-Capillary: 90 mg/dL (ref 70–99)
Glucose-Capillary: 91 mg/dL (ref 70–99)
Glucose-Capillary: 95 mg/dL (ref 70–99)
Glucose-Capillary: 95 mg/dL (ref 70–99)
Glucose-Capillary: 98 mg/dL (ref 70–99)
Glucose-Capillary: 98 mg/dL (ref 70–99)

## 2010-12-10 LAB — BASIC METABOLIC PANEL
BUN: 10 mg/dL (ref 6–23)
BUN: 12 mg/dL (ref 6–23)
BUN: 12 mg/dL (ref 6–23)
BUN: 13 mg/dL (ref 6–23)
BUN: 13 mg/dL (ref 6–23)
BUN: 13 mg/dL (ref 6–23)
BUN: 16 mg/dL (ref 6–23)
BUN: 7 mg/dL (ref 6–23)
BUN: 9 mg/dL (ref 6–23)
CO2: 18 mEq/L — ABNORMAL LOW (ref 19–32)
CO2: 19 mEq/L (ref 19–32)
CO2: 19 mEq/L (ref 19–32)
CO2: 21 mEq/L (ref 19–32)
CO2: 21 mEq/L (ref 19–32)
CO2: 22 mEq/L (ref 19–32)
CO2: 22 mEq/L (ref 19–32)
CO2: 22 mEq/L (ref 19–32)
CO2: 25 mEq/L (ref 19–32)
CO2: 26 mEq/L (ref 19–32)
Calcium: 7.6 mg/dL — ABNORMAL LOW (ref 8.4–10.5)
Calcium: 7.7 mg/dL — ABNORMAL LOW (ref 8.4–10.5)
Calcium: 7.7 mg/dL — ABNORMAL LOW (ref 8.4–10.5)
Calcium: 7.9 mg/dL — ABNORMAL LOW (ref 8.4–10.5)
Calcium: 7.9 mg/dL — ABNORMAL LOW (ref 8.4–10.5)
Calcium: 8.1 mg/dL — ABNORMAL LOW (ref 8.4–10.5)
Calcium: 8.2 mg/dL — ABNORMAL LOW (ref 8.4–10.5)
Calcium: 8.2 mg/dL — ABNORMAL LOW (ref 8.4–10.5)
Calcium: 8.8 mg/dL (ref 8.4–10.5)
Calcium: 9.2 mg/dL (ref 8.4–10.5)
Chloride: 103 mEq/L (ref 96–112)
Chloride: 107 mEq/L (ref 96–112)
Chloride: 108 mEq/L (ref 96–112)
Chloride: 108 mEq/L (ref 96–112)
Chloride: 109 mEq/L (ref 96–112)
Chloride: 109 mEq/L (ref 96–112)
Chloride: 110 mEq/L (ref 96–112)
Chloride: 110 mEq/L (ref 96–112)
Chloride: 111 mEq/L (ref 96–112)
Chloride: 112 mEq/L (ref 96–112)
Creatinine, Ser: 0.6 mg/dL (ref 0.4–1.5)
Creatinine, Ser: 0.6 mg/dL (ref 0.4–1.5)
Creatinine, Ser: 0.6 mg/dL (ref 0.4–1.5)
Creatinine, Ser: 0.69 mg/dL (ref 0.4–1.5)
Creatinine, Ser: 0.72 mg/dL (ref 0.4–1.5)
Creatinine, Ser: 1.03 mg/dL (ref 0.4–1.5)
Creatinine, Ser: 1.03 mg/dL (ref 0.4–1.5)
Creatinine, Ser: 1.06 mg/dL (ref 0.4–1.5)
GFR calc Af Amer: >60 mL/min (ref >60)
GFR calc Af Amer: >60 mL/min (ref >60)
GFR calc Af Amer: >60 mL/min (ref >60)
GFR calc Af Amer: >60 mL/min (ref >60)
GFR calc Af Amer: >60 mL/min (ref >60)
GFR calc Af Amer: >60 mL/min (ref >60)
GFR calc Af Amer: >60 mL/min (ref >60)
GFR calc Af Amer: >60 mL/min (ref >60)
GFR calc Af Amer: >60 mL/min (ref >60)
GFR calc Af Amer: >60 mL/min (ref >60)
GFR calc non Af Amer: >60 mL/min (ref >60)
GFR calc non Af Amer: >60 mL/min (ref >60)
GFR calc non Af Amer: >60 mL/min (ref >60)
GFR calc non Af Amer: >60 mL/min (ref >60)
GFR calc non Af Amer: >60 mL/min (ref >60)
GFR calc non Af Amer: >60 mL/min (ref >60)
GFR calc non Af Amer: >60 mL/min (ref >60)
GFR calc non Af Amer: >60 mL/min (ref >60)
Glucose, Bld: 103 mg/dL — ABNORMAL HIGH (ref 70–99)
Glucose, Bld: 131 mg/dL — ABNORMAL HIGH (ref 70–99)
Glucose, Bld: 149 mg/dL — ABNORMAL HIGH (ref 70–99)
Glucose, Bld: 155 mg/dL — ABNORMAL HIGH (ref 70–99)
Glucose, Bld: 162 mg/dL — ABNORMAL HIGH (ref 70–99)
Glucose, Bld: 86 mg/dL (ref 70–99)
Glucose, Bld: 95 mg/dL (ref 70–99)
Potassium: 3.5 mEq/L (ref 3.5–5.1)
Potassium: 3.8 mEq/L (ref 3.5–5.1)
Potassium: 3.8 mEq/L (ref 3.5–5.1)
Potassium: 4.1 mEq/L (ref 3.5–5.1)
Potassium: 4.2 mEq/L (ref 3.5–5.1)
Potassium: 4.3 mEq/L (ref 3.5–5.1)
Sodium: 134 mEq/L — ABNORMAL LOW (ref 135–145)
Sodium: 134 mEq/L — ABNORMAL LOW (ref 135–145)
Sodium: 135 mEq/L (ref 135–145)
Sodium: 136 mEq/L (ref 135–145)
Sodium: 136 mEq/L (ref 135–145)
Sodium: 137 mEq/L (ref 135–145)
Sodium: 138 mEq/L (ref 135–145)
Sodium: 138 mEq/L (ref 135–145)
Sodium: 139 mEq/L (ref 135–145)

## 2010-12-10 LAB — DIFFERENTIAL
Basophils Absolute: 0 10*3/uL (ref 0.0–0.1)
Basophils Relative: 0 % (ref 0–1)
Basophils Relative: 1 % (ref 0–1)
Eosinophils Absolute: 0.1 10*3/uL (ref 0.0–0.7)
Eosinophils Absolute: 0.2 10*3/uL (ref 0.0–0.7)
Eosinophils Absolute: 0.5 10*3/uL (ref 0.0–0.7)
Eosinophils Relative: 0 % (ref 0–5)
Lymphocytes Relative: 9 % — ABNORMAL LOW (ref 12–46)
Lymphs Abs: 1.6 10*3/uL (ref 0.7–4.0)
Monocytes Absolute: 0.7 10*3/uL (ref 0.1–1.0)
Monocytes Absolute: 1.3 10*3/uL — ABNORMAL HIGH (ref 0.1–1.0)
Monocytes Relative: 2 % — ABNORMAL LOW (ref 3–12)
Monocytes Relative: 6 % (ref 3–12)
Neutro Abs: 6.6 10*3/uL (ref 1.7–7.7)
Neutrophils Relative %: 63 % (ref 43–77)
Neutrophils Relative %: 88 % — ABNORMAL HIGH (ref 43–77)

## 2010-12-10 LAB — POCT I-STAT, CHEM 8
Chloride: 94 mEq/L — ABNORMAL LOW (ref 96–112)
Creatinine, Ser: 0.9 mg/dL (ref 0.4–1.5)
Glucose, Bld: 100 mg/dL — ABNORMAL HIGH (ref 70–99)
Glucose, Bld: 122 mg/dL — ABNORMAL HIGH (ref 70–99)
HCT: 46 % (ref 39.0–52.0)
Hemoglobin: 15.6 g/dL (ref 13.0–17.0)
Hemoglobin: 16.7 g/dL (ref 13.0–17.0)
Potassium: 3 mEq/L — ABNORMAL LOW (ref 3.5–5.1)
Potassium: 4.8 mEq/L (ref 3.5–5.1)
Sodium: 126 mEq/L — ABNORMAL LOW (ref 135–145)
TCO2: 23 mmol/L (ref 0–100)

## 2010-12-10 LAB — CBC
HCT: 44 % (ref 39.0–52.0)
HCT: 45.7 % (ref 39.0–52.0)
Hemoglobin: 13.2 g/dL (ref 13.0–17.0)
Hemoglobin: 14.9 g/dL (ref 13.0–17.0)
Hemoglobin: 15.2 g/dL (ref 13.0–17.0)
Hemoglobin: 16.5 g/dL (ref 13.0–17.0)
MCHC: 33.2 g/dL (ref 30.0–36.0)
MCHC: 33.3 g/dL (ref 30.0–36.0)
MCHC: 33.6 g/dL (ref 30.0–36.0)
MCHC: 33.8 g/dL (ref 30.0–36.0)
MCHC: 34.3 g/dL (ref 30.0–36.0)
MCV: 94 fL (ref 78.0–100.0)
MCV: 95.8 fL (ref 78.0–100.0)
Platelets: 276 10*3/uL (ref 150–400)
Platelets: 286 10*3/uL (ref 150–400)
Platelets: 304 10*3/uL (ref 150–400)
Platelets: 306 10*3/uL (ref 150–400)
Platelets: 320 10*3/uL (ref 150–400)
Platelets: 377 10*3/uL (ref 150–400)
RBC: 4.15 MIL/uL — ABNORMAL LOW (ref 4.22–5.81)
RBC: 4.15 MIL/uL — ABNORMAL LOW (ref 4.22–5.81)
RBC: 4.28 MIL/uL (ref 4.22–5.81)
RBC: 4.68 MIL/uL (ref 4.22–5.81)
RBC: 4.7 MIL/uL (ref 4.22–5.81)
RBC: 4.77 MIL/uL (ref 4.22–5.81)
RBC: 5.2 MIL/uL (ref 4.22–5.81)
RDW: 12.7 % (ref 11.5–15.5)
RDW: 12.9 % (ref 11.5–15.5)
RDW: 13 % (ref 11.5–15.5)
WBC: 11.8 10*3/uL — ABNORMAL HIGH (ref 4.0–10.5)
WBC: 13.4 10*3/uL — ABNORMAL HIGH (ref 4.0–10.5)
WBC: 15.4 10*3/uL — ABNORMAL HIGH (ref 4.0–10.5)
WBC: 15.6 10*3/uL — ABNORMAL HIGH (ref 4.0–10.5)
WBC: 17.2 10*3/uL — ABNORMAL HIGH (ref 4.0–10.5)
WBC: 22.7 10*3/uL — ABNORMAL HIGH (ref 4.0–10.5)

## 2010-12-10 LAB — MAGNESIUM
Magnesium: 1.8 mg/dL (ref 1.5–2.5)
Magnesium: 1.9 mg/dL (ref 1.5–2.5)
Magnesium: 2 mg/dL (ref 1.5–2.5)
Magnesium: 2 mg/dL (ref 1.5–2.5)
Magnesium: 2 mg/dL (ref 1.5–2.5)
Magnesium: 2 mg/dL (ref 1.5–2.5)
Magnesium: 2 mg/dL (ref 1.5–2.5)
Magnesium: 2.1 mg/dL (ref 1.5–2.5)
Magnesium: 2.1 mg/dL (ref 1.5–2.5)

## 2010-12-10 LAB — PROTIME-INR
INR: 1 (ref 0.00–1.49)
INR: 1.8 — ABNORMAL HIGH (ref 0.00–1.49)
Prothrombin Time: 13.6 seconds (ref 11.6–15.2)
Prothrombin Time: 21.5 seconds — ABNORMAL HIGH (ref 11.6–15.2)

## 2010-12-10 LAB — POCT CARDIAC MARKERS
CKMB, poc: 1.1 ng/mL (ref 1.0–8.0)
CKMB, poc: 1.9 ng/mL (ref 1.0–8.0)
Myoglobin, poc: 64.2 ng/mL (ref 12–200)

## 2010-12-10 LAB — BRAIN NATRIURETIC PEPTIDE
Pro B Natriuretic peptide (BNP): 1380 pg/mL — ABNORMAL HIGH (ref 0.0–100.0)
Pro B Natriuretic peptide (BNP): 345 pg/mL — ABNORMAL HIGH (ref 0.0–100.0)
Pro B Natriuretic peptide (BNP): 452 pg/mL — ABNORMAL HIGH (ref 0.0–100.0)
Pro B Natriuretic peptide (BNP): 574 pg/mL — ABNORMAL HIGH (ref 0.0–100.0)

## 2010-12-10 LAB — BLOOD GAS, ARTERIAL
Acid-base deficit: 4.5 mmol/L — ABNORMAL HIGH (ref 0.0–2.0)
Acid-base deficit: 4.8 mmol/L — ABNORMAL HIGH (ref 0.0–2.0)
Acid-base deficit: 5 mmol/L — ABNORMAL HIGH (ref 0.0–2.0)
Acid-base deficit: 5.1 mmol/L — ABNORMAL HIGH (ref 0.0–2.0)
Acid-base deficit: 6.5 mmol/L — ABNORMAL HIGH (ref 0.0–2.0)
Bicarbonate: 19.5 mEq/L — ABNORMAL LOW (ref 20.0–24.0)
Drawn by: 29757
FIO2: 0.5 %
FIO2: 0.8 %
FIO2: 1 %
MECHVT: 1000 mL
MECHVT: 580 mL
MECHVT: 580 mL
MECHVT: 580 mL
O2 Saturation: 91.9 %
O2 Saturation: 99.3 %
O2 Saturation: 99.4 %
O2 Saturation: 99.7 %
PEEP: 5 cmH2O
PEEP: 5 cmH2O
Patient temperature: 91.4
Patient temperature: 91.4
Patient temperature: 98.6
Patient temperature: 98.6
RATE: 16 resp/min
RATE: 16 resp/min
TCO2: 20.2 mmol/L (ref 0–100)
TCO2: 20.4 mmol/L (ref 0–100)
TCO2: 20.6 mmol/L (ref 0–100)
pCO2 arterial: 33.2 mmHg — ABNORMAL LOW (ref 35.0–45.0)
pH, Arterial: 7.274 — ABNORMAL LOW (ref 7.350–7.450)
pH, Arterial: 7.392 (ref 7.350–7.450)
pO2, Arterial: 310 mmHg — ABNORMAL HIGH (ref 80.0–100.0)
pO2, Arterial: 50.6 mmHg — ABNORMAL LOW (ref 80.0–100.0)

## 2010-12-10 LAB — COMPREHENSIVE METABOLIC PANEL
ALT: 17 U/L (ref 0–53)
ALT: 36 U/L (ref 0–53)
AST: 47 U/L — ABNORMAL HIGH (ref 0–37)
Albumin: 3.2 g/dL — ABNORMAL LOW (ref 3.5–5.2)
Alkaline Phosphatase: 103 U/L (ref 39–117)
Alkaline Phosphatase: 105 U/L (ref 39–117)
BUN: 7 mg/dL (ref 6–23)
CO2: 21 mEq/L (ref 19–32)
CO2: 24 mEq/L (ref 19–32)
Calcium: 8.5 mg/dL (ref 8.4–10.5)
Chloride: 102 mEq/L (ref 96–112)
Chloride: 103 mEq/L (ref 96–112)
Creatinine, Ser: 0.85 mg/dL (ref 0.4–1.5)
GFR calc non Af Amer: >60 mL/min (ref >60)
GFR calc non Af Amer: >60 mL/min (ref >60)
Glucose, Bld: 118 mg/dL — ABNORMAL HIGH (ref 70–99)
Glucose, Bld: 143 mg/dL — ABNORMAL HIGH (ref 70–99)
Potassium: 3.3 mEq/L — ABNORMAL LOW (ref 3.5–5.1)
Potassium: 4.8 mEq/L (ref 3.5–5.1)
Sodium: 135 mEq/L (ref 135–145)
Sodium: 141 mEq/L (ref 135–145)
Total Bilirubin: 0.9 mg/dL (ref 0.3–1.2)
Total Protein: 6.2 g/dL (ref 6.0–8.3)

## 2010-12-10 LAB — CARDIAC PANEL(CRET KIN+CKTOT+MB+TROPI)
CK, MB: 107.1 ng/mL — ABNORMAL HIGH (ref 0.3–4.0)
CK, MB: 2.2 ng/mL (ref 0.3–4.0)
CK, MB: 21 ng/mL — ABNORMAL HIGH (ref 0.3–4.0)
CK, MB: 4.7 ng/mL — ABNORMAL HIGH (ref 0.3–4.0)
CK, MB: 581.2 ng/mL — ABNORMAL HIGH (ref 0.3–4.0)
Relative Index: 0.8 (ref 0.0–2.5)
Relative Index: 3.3 — ABNORMAL HIGH (ref 0.0–2.5)
Relative Index: 4.4 — ABNORMAL HIGH (ref 0.0–2.5)
Relative Index: INVALID (ref 0.0–2.5)
Total CK: 2320 U/L — ABNORMAL HIGH (ref 7–232)
Total CK: 2506 U/L — ABNORMAL HIGH (ref 7–232)
Total CK: 2746 U/L — ABNORMAL HIGH (ref 7–232)
Total CK: 5025 U/L — ABNORMAL HIGH (ref 7–232)
Total CK: 95 U/L (ref 7–232)
Troponin I: 2.14 ng/mL (ref 0.00–0.06)
Troponin I: 3.02 ng/mL (ref 0.00–0.06)
Troponin I: >100 ng/mL (ref 0.00–0.06)

## 2010-12-10 LAB — CULTURE, BLOOD (ROUTINE X 2): Culture: NO GROWTH

## 2010-12-10 LAB — LIPID PANEL
Cholesterol: 178 mg/dL (ref 0–200)
HDL: 24 mg/dL — ABNORMAL LOW (ref >39)

## 2010-12-10 LAB — URINE DRUGS OF ABUSE SCREEN W ALC, ROUTINE (REF LAB)
Barbiturate Quant, Ur: NEGATIVE
Benzodiazepines.: POSITIVE — AB
Creatinine,U: 85.8 mg/dL
Methadone: NEGATIVE

## 2010-12-10 LAB — PHOSPHORUS
Phosphorus: 2.1 mg/dL — ABNORMAL LOW (ref 2.3–4.6)
Phosphorus: 2.3 mg/dL (ref 2.3–4.6)
Phosphorus: 2.5 mg/dL (ref 2.3–4.6)
Phosphorus: 2.5 mg/dL (ref 2.3–4.6)
Phosphorus: 2.5 mg/dL (ref 2.3–4.6)
Phosphorus: 2.8 mg/dL (ref 2.3–4.6)

## 2010-12-10 LAB — CULTURE, BAL-QUANTITATIVE W GRAM STAIN: Colony Count: >=100000

## 2010-12-10 LAB — APTT
aPTT: 126 seconds — ABNORMAL HIGH (ref 24–37)
aPTT: 29 seconds (ref 24–37)

## 2010-12-10 LAB — HEMOGLOBIN A1C
Hgb A1c MFr Bld: 5.7 % (ref 4.6–6.1)
Mean Plasma Glucose: 117 mg/dL

## 2010-12-10 LAB — CHOLESTEROL, TOTAL: Cholesterol: 162 mg/dL (ref 0–200)

## 2011-01-08 NOTE — H&P (Signed)
NAME:  [name], [name] NO.:  [id number]   MEDICAL RECORD NO.:  [id number]          PATIENT TYPE:  INP   LOCATION:  2902                         FACILITY:  [facility]   PHYSICIAN:  [name], [name].  DATE OF BIRTH:  Jul 15, 1956   DATE OF ADMISSION:  09/02/2008  DATE OF DISCHARGE:                              HISTORY & PHYSICAL   HISTORY OF PRESENT ILLNESS:  [name] is a 55 year old white male who  presents today after a witnessed cardiac arrest at home.  The patient  had recently been admitted on August 26, 2008, for an acute anterior ST-  elevation myocardial infarction.  At that time, he had an occluded mid  LAD.  He had moderate disease in the left circumflex and PDA  distribution.  He underwent emergent stenting of the entire mid LAD at  that time.  His post MI course was complicated by some episodes of  nonsustained ventricular tachycardia early in his hospital course that  subsequently resolved.  He had no evidence of congestive heart failure  or recurrent angina.  He was discharged home on August 30, 2008.  His  wife reports he was doing very well and he was taking his medicines, and  he quit smoking.  She states early this morning at 2 a.m., she had  talked to him and noted that it was snowing outside.  At 4:00 a.m., she  noticed that he was breathing very hard and loud and was not responsive.  She called her daughter and then CPR was initiated.  EMS was called.  The first responders were the fire department who placed an AED and the  patient was delivered a shock.  When EMS arrived, the patient was found  to be in sinus rhythm with some nonsustained ventricular tachycardia.  He remained unresponsive, was intubated.  He was transferred initially  to [facility] and subsequently referred here for further  evaluation.  The patient apparently did not receive any vasoactive drugs  during this time.  The entire length of his unresponsiveness prior to  EMS, was unclear.  He did arrive at [facility] approximately at  6:00 a.m. and was transferred subsequently here.  The patient does have  a history of hypertension and hypercholesterolemia.  Prior to his recent  heart attack, he had not had any regular medical followup.  It is  noteworthy that his ECG after his heart attack still showed persistent  ST elevation across the anterior precordium and even in the inferior  leads with Q waves diffusely in the anterior precordial leads.   PAST MEDICAL HISTORY:  1. Recent anterior ST-elevation myocardial infarction.  2. Hypercholesterolemia.  3. Hypertension.  4. History of tobacco abuse.   CURRENT MEDICATIONS:  1. Enteric-coated aspirin 325 mg per day.  2. Plavix 75 mg daily.  3. Pravastatin 40 mg daily.  4. Ramipril 5 mg daily.  5. Metoprolol 50 mg twice a day.  6. Nitroglycerin 0.4 mg p.r.n.   SOCIAL HISTORY AND FAMILY HISTORY:  Reviewed in his recent H&P and are  unchanged.   REVIEW OF SYSTEMS:  Unobtainable since he is unresponsive on the  ventilator.  No family was initially available for a review.   PHYSICAL EXAMINATION:  GENERAL:  The patient is a middle-aged white  male, unresponsive, on the ventilator.  VITAL SIGNS:  His blood pressure was 190/110.  His pulse is 115 and  regular, in sinus rhythm.  He was ventilated.  His sats were 100%.  HEENT:  Normocephalic, atraumatic.  His pupils were equal, round, and  reactive.  His oropharynx was orally intubated.  NECK:  Without JVD, adenopathy, thyromegaly, or bruits.  LUNGS:  Clear anteriorly.  CARDIAC:  Regular rate and rhythm without gallop, murmur, rub, or click.  ABDOMEN:  Soft, nontender, and nondistended.  Bowel sounds are positive.  EXTREMITIES:  No edema or cyanosis.  His pedal pulses are excellent.  His right groin has healed well with minimal bruising from prior cardiac  catheterization.  NEUROLOGIC:  He is unresponsive.  He has decerebrate posturing with his   upper extremities.  He is shivering.  His pupils are midline and  reactive.   LABORATORY DATA:  His ECG again shows Q waves across the anterior  precordium with persistent ST elevation in V2 through V6.  This is  really unchanged from his pre discharge EKG.   IMPRESSION:  1. Acute cardiac arrest:  Based on his recent history, I feel this is      most likely due to a rhythm disturbance.  I need to rule out      subacute stent thrombosis.  2. Recent acute anterior ST-elevation myocardial infarction.  3. Hypertension.  4. Hypercholesterolemia.  5. Tobacco abuse.   PLAN:  The patient is taken to the cardiac catheterization laboratory  for acute cardiac catheterization.  He will need aggressive supportive  measures including ventilator support, central line, and will initiate  cooling protocol with the [facility].  Critical Care Medicine will be  consulted.  He will need aggressive blood pressure management as well.           ______________________________  [name], [name].     PMJ/MEDQ  D:  09/02/2008  T:  09/03/2008  Job:  [id number]

## 2011-01-08 NOTE — Consult Note (Signed)
NAME:  [name], [name] NO.:  [id number]   MEDICAL RECORD NO.:  [id number]          PATIENT TYPE:  INP   LOCATION:  3704                         FACILITY:  [facility]   PHYSICIAN:  Hillis Range, MD       DATE OF BIRTH:  [birth year]   DATE OF CONSULTATION:  DATE OF DISCHARGE:                                 CONSULTATION   REQUESTING PHYSICIAN:  Acey Lav, MD   REASON FOR CONSULTATION:  Staph aureus bacteremia.   HISTORY OF PRESENT ILLNESS:  Mr. Juenger is a very pleasant 55 year old  gentleman with a history of coronary artery disease, status post prior  myocardial infarction on August 26, 2008, at which time, he presented  with an occlusion of his mid LAD, requiring emergent PCI.  He did well  for several days and discharged.  He subsequently returned with a  ventricular fibrillation arrest.  Cath at that time revealed that he has  50% narrowing within his stent, but this was felt to not be the cause  for his ventricular fibrillation arrest.  He therefore underwent ICD  implantation for secondary prevention of sudden cardiac death by me on  09/11/08 after a prolonged intubation and cooling within the  ICU.  He has done very well since that time without any further  arrhythmias.  He denies any recent episodes of chest pain, shortness of  breath, palpitations, fevers, or chills.  Most recently on February 18, 2009, he did develop significant nausea and vomiting and chills.  He  presented to [facility] and was initiated on oral antibiotics.  He continued to have worsening fevers and chills as well as a left  pleuritic chest pain.  He was felt to have bronchitis and was initiated  on antibiotics.  When his fever persisted, he returned to [facility] where he was found to have staph aureus bacteremia.  He has had  multiple positive blood cultures.  He has also had a significant  leukocytosis as well as progressive thrombocytopenia.  No obvious  source  for his infection has been determined.  The patient reports chronic and  frequent skin nicking and breakdown, but denies any significant  infections of the skin.  He has chronic pain within his right ankle and  notes that his right ankle has been more warm and painful recently with  very significant difficulties in walking.  He has had no pain over his  defibrillator site and has noted no erythema, drainage or fluctuance.  He has been hospitalized and initiated on intravenous antibiotics, but  continues to be intermittently febrile and ill.  He has had significant  decline in his platelet count and evaluation by Hematology suggest  infection and sepsis as the cause.  The patient has also been evaluated  by Infectious Disease who recommends defibrillator extraction.  A  transthoracic echocardiogram was performed on September 23, 2008 and this  revealed an ejection fraction of 45% as well as a small mural thrombus  without mobility; however, no significant evidence of vegetations was  observed.   PAST MEDICAL  HISTORY:  1. Coronary artery disease, status post acute anterior infarction,      August 26, 2008 with PCI of the mid LAD at that time.  2. Ventricular fibrillation arrest, September 02, 1998, requiring      prolonged intubation and cooling.  3. Status post implantation of a Medtronic Virtuoso DR dual-chamber      ICD system, September 08, 2008, had inducible ventricular tachycardia      while on amiodarone therapy at time of implants.  4. Ischemic cardiomyopathy (ejection fraction 45%).  5. Prior right ankle surgery.  6. History of tobacco use.   CURRENT MEDICATIONS:  1. Pantoprazole 40 mg IV.  2. Lopressor 5 mg IV q.6 h.  3. Vancomycin 1 g IV q.12 h.  4. Normal saline.  5. Levofloxacin 500 mg IV daily.  6. Zofran IV p.r.n.   ALLERGIES:  No known drug allergies.   HOME MEDICINES:  1. Ramipril 5 mg daily.  2. Plavix 75 mg daily.  3. Aspirin 325 mg daily.  4. Crestor 20  mg daily.  5. Metoprolol 75 mg b.i.d.  6. Magnesium oxide 400 mg b.i.d.   SOCIAL HISTORY:  The patient lives in [place] with his family.  He  continues to smoke occasionally and denies alcohol or drug use.   FAMILY HISTORY:  Notable for coronary artery disease, stroke, and  hypertension.   REVIEW OF SYSTEMS:  All systems are reviewed and negative except as  outlined above.   PHYSICAL EXAMINATION:  Telemetry reveals sinus rhythm.  VITAL SIGNS:  Blood pressure 162/80, heart rate 90, respirations 20,  sats 94% on room air, temperature 100.4, T-max 104.  GENERAL:  The patient is an ill-appearing gentleman in no acute  distress.  He is alert and oriented x3.  HEENT:  Normocephalic and atraumatic.  Sclerae clear.  Conjunctivae  pink.  Oropharynx clear.  NECK:  Supple.  No thyromegaly, JVD, lymphadenopathy, or bruits.  LUNGS:  Clear with scattered rales.  HEART:  Regular rate and rhythm.  No murmurs, rubs, or gallops.  GI:  Soft, nontender, nondistended.  Positive bowel sounds.  EXTREMITIES:  No clubbing, cyanosis, or edema.  NEUROLOGIC:  Cranial nerves II through XII are intact.  Strength and  sensation are intact.  SKIN:  The patient is diffusely warm.  There appears to be slightly more  warmth within the right ankle.  MUSCULOSKELETAL:  No deformity or atrophy.  PSYCHIATRY:  Euthymic mood.  Full affect.  CHEST:  The patient's ICD pocket is well healed with no evidence of  active infection.  There was no drainage, fluctuance, erythema,  tenderness and only minimal warmth above the remainder of his body.   LABS:  White blood cell count 10.4, hematocrit 38, platelets 22.  LDH  264, creatinine 0.9.  sodium 133, potassium 3.6, cardiac markers  negative.  Lactic acid 2.5.  blood cultures from two site are growing  Staphylococcus aureus.   Chest x-ray:  Reviewed the patient's chest x-ray which reveals perhaps  mild infiltration of the right mid lung, otherwise unremarkable and   abdominal ultrasound reveals mild intrahepatic biliary dilatation,  otherwise unremarkable.   EKG reveals sinus rhythm at 80 beats per minute with a QT interval of  366 milliseconds, otherwise unremarkable.   IMPRESSION:  Mr. Chiarelli is a very pleasant 55 year old gentleman with  an ischemic cardiomyopathy and prior ventricular fibrillation arrest,  status post implantation of a defibrillator for secondary prevention of  ventricular fibrillation.  He now presents with Staphylococcus  aureus  bacteremia and sepsis of an unclear cause.  I am concerned that his  defibrillator has become infected and will need to be extracted before  his bacteremia can be adequately cleared.  The patient has been  evaluated by Infectious Disease who also suggest extraction of his  defibrillator.  The patient has been evaluated by Hematology for his  acute thrombocytopenia and is felt to be infectious in etiology.  I have  discussed the patient's thrombocytopenia with Dr. Darrold Span this evening  who recommends transfusion of platelets immediately prior to extraction  of his device.  We will therefore plan to extract the patient's  defibrillator in the morning or certainly at the next available time.  I  think that extraction is expedient due to the patient's illness and I am  concerned that his infection will not clear otherwise.  The patient will  continue antibiotics in the interim.  Once his system has been  extracted, we will consider placement of a LifeVest for 6 weeks while he  receives further antibiotics.  We would defer reimplantation of his ICD  until all evidence of infection has been resolved.  I had a long  discussion with the patient and his spouse regarding the risks of  extraction of his device.  They understand that the risk include  bleeding, cardiac perforation, vascular damage with perforation,  tamponade, renal failure, emboli to the lung or other vessels, stroke,  MI, and death.  They  understand that there is a risk requiring emergent  surgical repair should bleeding occur.  They recognized that his risk  for bleeding has increased with thrombocytopenia.  The patient wishes to  proceed.      Hillis Range, MD  Electronically Signed     JA/MEDQ  D:  02/22/2009  T:  02/22/2009  Job:  [id number]   cc:   Acey Lav, MD  Hospitalist Team E  [name], [name].

## 2011-01-08 NOTE — Op Note (Signed)
NAME:  [name], [name] NO.:  [id number]   MEDICAL RECORD NO.:  [id number]          PATIENT TYPE:  INP   LOCATION:  2020                         FACILITY:  [facility]   PHYSICIAN:  Hillis Range, MD       DATE OF BIRTH:  1956/07/01   DATE OF PROCEDURE:  09/08/2008  DATE OF DISCHARGE:  09/09/2008                               OPERATIVE REPORT   PREPROCEDURE DIAGNOSES:  1. Ventricular fibrillation arrest.  2. Ischemic cardiomyopathy.  3. Coronary artery disease.   POSTPROCEDURE DIAGNOSES:  1. Ventricular fibrillation arrest.  2. Ischemic cardiomyopathy.  3. Coronary artery disease.   PROCEDURES:  1. Implantable cardioverter-defibrillator implantation.  2. Defibrillation threshold testing.  3. Programmed extrastimulus testing from the right ventricular apex.  4. Left upper extremity venogram.   INTRODUCTION:  [name] is a pleasant 55 year old gentleman who was  successfully resuscitated following a ventricular fibrillation arrest  who presents for an ICD implantation.  The patient presented to [facility] on September 02, 2008, with incessant ventricular  fibrillation requiring multiple ICD shocks.  He has a known ischemic  cardiomyopathy with coronary artery disease.  A left heart  catheterization was performed which revealed no lesions amenable to  further revascularization.  He therefore presents for ICD implantation  for secondary prevention of sudden cardiac death.   DESCRIPTION OF PROCEDURE:  Informed written consent was obtained, and  the patient was brought to the electrophysiology lab in a fasting state.  He was adequately sedated with intravenous medications as outlined in  the nursing report.  The patient's left chest was prepped and draped in  the usual sterile fashion by the EP lab staff.  The skin overlying the  left deltopectoral region was infiltrated with lidocaine for local  analgesia.  A 5-cm incision was then made over the left  deltopectoral  region.  A subcutaneous ICD pocket was fashioned using a combination of  sharp and blunt dissection.  A venogram of the left upper extremity was  performed which revealed a patent left axillary and left subclavian  venous system.  The left axillary vein was accessed and through this  vein, a Medtronic model [id number] (serial [id number]) right atrial lead  and a Medtronic Sprint Quattro Secure S model [id number] (serial  [id number]) right ventricular lead were advanced with fluoroscopic  visualization into the right atrial appendage and right ventricular apex  positions respectively.  Initial lead measurements revealed an atrial  lead P-wave of 2.8 mV with an impedance of 736 ohms and a threshold of 1  volt at 0.5 milliseconds.  The right ventricular lead R-wave measured  14.4 mV with an impedance of 725 ohms and a threshold of 0.7 volts at  0.5 milliseconds.  The leads were then actively fixed to the myocardium  and secured to the pectoralis fascia using #2 silk suture over the  suture sleeves.  Electrocautery was used to assure hemostasis.  The ICD  pocket was irrigated with copious gentamicin solution.  The leads were  then connected to a Medtronic Virtuoso DR model [id number] (serial  #[id number]) dual-chamber ICD.  The  device was placed into the pocket,  and the ICD pocket was closed in 2 layers with 2.0 Vicryl suture for the  subcutaneous and subcuticular layers.  Steri-Strips and a sterile  bandage were then applied.  Ventricular fibrillation was then induced  with a T shock and adequate sensing of ventricular fibrillation was  observed with a programmed sensitivity of 1.2 mV.  The patient was  successfully defibrillated with a 15-joule shock delivered through the  device with a duration of 6 seconds and an impedance of 55 ohms.  Programmed extrastimulus testing was performed from the right  ventricular apex through the device with a basic cycle length of 500  milliseconds  with S1, S2, S3, S4 down to refractoriness with no  inducible ventricular tachycardias.  The procedure was therefore  considered completed.  There were no early apparent complications.   CONCLUSIONS:  1. Successful implantation of a Medtronic Virtuoso DR dual-chamber ICD      as described above.  2. Defibrillation threshold less than or equal to 15 joules.  3. No inducible ventricular tachycardia while on amiodarone therapy.  4. No early apparent complications.      Hillis Range, MD  Electronically Signed     JA/MEDQ  D:  10/10/2008  T:  10/10/2008  Job:  [id number]   cc:   [name], [name].

## 2011-01-08 NOTE — Consult Note (Signed)
NAME:  [name], [name] NO.:  [id number]   MEDICAL RECORD NO.:  [id number]          PATIENT TYPE:  INP   LOCATION:  2020                         FACILITY:  [facility]   PHYSICIAN:  Hillis Range, MD       DATE OF BIRTH:  07-03-1956   DATE OF CONSULTATION:  DATE OF DISCHARGE:                                 CONSULTATION   REASON FOR CONSULTATION:  Polymorphic ventricular tachycardia.   HISTORY OF PRESENT ILLNESS:  [name] is a pleasant 55 year old  gentleman with coronary artery disease, ischemic cardiomyopathy, and  recent sudden cardiac arrest, who was admitted on September 02, 2008,  following cardiac arrest.  He initially presented with acute ST  elevation and was found to have an anterior myocardial infarction on  August 26, 2008.  He subsequently underwent Xience stent to the left  anterior descending artery.  He was noted to have significant  nonsustained ventricular tachycardia during his [name] stay.  He was  discharged with plans for medical therapy for his ischemic  cardiomyopathy.  Unfortunately, subsequently, he had a cardiac arrest on  September 02, 2008.  He was observed at home to collapse with agonal  respiration and unresponsiveness.  CPR was initiated.  He subsequently  received defibrillation from an AED by EMS.  He was brought to [name] for further evaluation.  Repeat left heart catheterization  revealed a 50% narrowing within the mid LAD stent, which was  successfully angioplastied.  No other new ischemic lesions were found  and this was felt to not be the cause for his cardiac arrest.  The  patient was noted to have severe left ventricular dysfunction.  During  the patient's [name] stay thereafter, he was intubated and received  the cooling protocol.  He subsequently had significant improvement in  mental status and has been extubated.  The patient's ejection fraction  is now felt to be 30-35% by left ventriculogram.  He had  transient QT  prolongation while intubated and receiving the cooling protocol with  polymorphic ventricular tachycardia observed, though he did not have a  short-long-short coupling sequence with his polymorphic ventricular  tachycardia.  He has also had several episodes of nonsustained  monomorphic ventricular tachycardia during his [name] stay.  The  polymorphic ventricular tachycardia did require external cardioversion.  The patient was initiated on amiodarone intravenously which has  subsequently been transitioned to oral amiodarone.  Presently, he is  doing well without complaint.   PAST MEDICAL HISTORY:  1. Coronary artery disease (as above).  2. Ischemic cardiomyopathy (as above).  3. Polymorphic ventricular tachycardia (as above).  4. Ejection fraction 30-35%.  5. Prior ankle surgery.  6. History of tobacco use.   ALLERGIES:  No known drug allergies.   CURRENT MEDICATIONS:  1. Simvastatin 40 mg nightly.  2. Heparin 5000 units subcu q.8 h.  3. Pyridium 200 mg t.i.d.  4. Insulin.  5. Protonix 40 mg daily.  6. Aspirin 325 mg daily.  7. Plavix 75 mg daily.  8. Metoprolol 50 mg b.i.d.  9. Ramipril 10 mg daily.  10.Lasix 40 mg  daily.  11.Intravenous Zosyn 3.375 mg IV q.8 h.   SOCIAL HISTORY:  The patient lives in [place] and works as a  Curator.  He has a longstanding history of tobacco.  He also smokes  marijuana.  He denies alcohol use or other drug use.   FAMILY HISTORY:  The patient is unaware of any significant family  history.   REVIEW OF SYSTEMS:  All systems are reviewed and negative except as  outlined in the HPI above.   PHYSICAL EXAMINATION:  VITAL SIGNS:  Blood pressure 147/89, heart rate  66, respirations 18, sats 96% on 2 liters, and afebrile.  GENERAL:  The patient is alert and oriented x3.  HEENT:  Normocephalic and atraumatic.  Sclerae are clear.  Conjunctivae  are pink.  Oropharynx is clear.  NECK:  Supple.  No JVD, lymphadenopathy,  thyromegaly, or bruits.  LUNGS:  Clear to auscultation bilaterally.  HEART:  Regular rate and rhythm.  No murmurs, rubs, or gallops.  GI:  Soft, nontender, and nondistended.  Positive bowel sounds.  EXTREMITIES:  No clubbing, cyanosis, or edema.  NEUROLOGIC:  Strength and sensation are intact.  SKIN:  No ecchymoses or lacerations.  The patient has a left-sided CVL  over the subclavian region  PSYCH:  Flat affect, euthymic mood.   LABORATORY DATA:  White blood cell count 15.6, hematocrit 35, and  platelets 304.  Sodium 134, potassium 3, creatinine 0.87, and magnesium  1.9.   EKG reveals sinus rhythm at 70 beats per minute with an inferior  infarction pattern and an anterior injury pattern.   IMPRESSION:  [name] is a pleasant 55 year old gentleman with a  recent myocardial infarction and ischemic cardiomyopathy, who was  admitted with a ventricular tachycardia/ventricular fibrillation arrest.  He has been resuscitated and now has significant return in his mental  status.  Throughout the patient's [name] stay, he has been documented  to have polymorphic ventricular tachycardia which has required  cardioversion as well as nonsustained ventricular tachycardia.  The  patient should receive an ICD for secondary prevention of sudden cardiac  death following his ventricular tachycardia/ventricular fibrillation  arrest.  As he continues to have ventricular arrhythmias, I agree that  we should continue him on his present dose of amiodarone.  He did have  transient QT prolongation during his [name] stay of unclear etiology.  Reviewing his medicines, it does not appear that he has been taking  medicines, which would likely prolong his QT interval except for  amiodarone.  I think that it would be unlikely for amiodarone to  transiently prolong his QT interval.  I would therefore prefer to  continue amiodarone.   PLAN:  Risks, benefits, and alternatives to ICD implantation were   discussed at length with the patient and his daughter.  The risks  include but are not limited to infection, bleeding, vascular damage,  pericardial effusion, perforation, pneumothorax, renal failure, stroke,  and death.  The patient understands these risks and wishes to proceed.  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  The  patient  understands that this will be an adjustment to his lifestyle, but does  agree to proceed with ICD implantation.  We will therefore schedule the  patient for ICD at the next available time.  I have made no medication  changes at this time.  Hillis Range, MD  Electronically Signed     JA/MEDQ  D:  09/06/2008  T:  09/07/2008  Job:  [id number]   cc:   [name], [name].

## 2011-01-08 NOTE — Op Note (Signed)
[name], [name] NO.:  [id number]   MEDICAL RECORD NO.:  [id number]          PATIENT TYPE:  INP   LOCATION:  2915                         FACILITY:  [facility]   PHYSICIAN:  Doylene Canning. Ladona Ridgel, MD    DATE OF BIRTH:  1956/05/28   DATE OF PROCEDURE:  02/23/2009  DATE OF DISCHARGE:                               OPERATIVE REPORT   PROCEDURE PERFORMED:  Removal by way of transvenous extraction of a dual-  chamber implantable cardioverter-defibrillator.   INDICATIONS:  Staph bacteremia.   INTRODUCTION:  The patient is a 55 year old man with a history of VF  arrest and myocardial infarction, who underwent ICD implantation back in  January.  The patient subsequently had been stable, but then developed  fevers and chills, was admitted to the hospital where he was found to  have staph bacteremia.  He remained febrile despite initiation of  antibiotics, and Infectious Disease consultants were notified and  recommended removal of his defibrillator system.   PROCEDURE:  After informed consent was obtained, the patient was taken  to the diagnostic EP lab in fasting state.  After usual preparation and  draping, intravenous fentanyl and midazolam was given for sedation.  Lidocaine 30 mL was infiltrated in the left infraclavicular region over  the old ICD insertion site.  A 7-cm incision was carried out over this  region.  Electrocautery was utilized to dissect down the fascial plane  and enter the pocket.  The generator was removed from the pocket.  The  electrocautery was then utilized to dissect down and free up the  defibrillator and atrial pacing leads.  With the fibrous adhesions of  these leads freed up, a 52-cm stylet was advanced into the right atrial  lead, and the turning tool was utilized to retract the helix back into  the body of the lead itself.  Utilizing counterclockwise torque and  gentle traction, the atrial lead was removed in total.  Next, attention  was then  turned to the defibrillation lead.  A 60-cm stylet was advanced  into the defibrillator lead, and the helix was again retracted back into  the body of the lead utilizing counterclockwise action on the turning  tool.  At this point, the lead was counterclockwise rotated and gentle  traction was applied to the lead.  The defibrillator had more fibrous  adhesions and was initially more difficult to remove.  However, after a  period of constant traction on the lead, the adhesions were bypassed,  and the lead was removed in total.  A pressure was then held.  Electrocautery was utilized to assure hemostasis, and the pocket was  irrigated with gentamicin irrigation, and the incision was closed with 2-  0 Vicryl and 3-0 Vicryl.  Benzoin was painted on the skin as pressure  dressing was placed, and the patient was returned to his room in  satisfactory condition.  It should be noted that there were no  hemodynamic instability associated with removal of the lead.   COMPLICATIONS:  There were no immediate procedure complications.   RESULTS:  Demonstrate successful ICD lead and atrial  lead and generator  removal/extraction in the patient with Staph bacteremia, who is 6 months  out from his defibrillator implant.      Doylene Canning. Ladona Ridgel, MD  Electronically Signed     GWT/MEDQ  D:  02/23/2009  T:  02/23/2009  Job:  [id number]   cc:   [name], [name].

## 2011-01-08 NOTE — Discharge Summary (Signed)
NAME:  ENRIGUE, HASHIMI NO.:  [id number]   MEDICAL RECORD NO.:  [id number]          PATIENT TYPE:  INP   LOCATION:  3711                         FACILITY:  [facility]   PHYSICIAN:  [name], [name].  DATE OF BIRTH:  [birth year]   DATE OF ADMISSION:  08/26/2008  DATE OF DISCHARGE:  08/30/2008                               DISCHARGE SUMMARY   HISTORY OF PRESENT ILLNESS:  Mr. [name] is a 55 year old white male who  presented with an acute onset of severe substernal chest pain while  chopping wood.  Pain radiating into his left arm associated with  shortness of breath.  He was taken to [facility] where ECG  showed marked hyperacute ST elevation of 4-5 mm in leads V3-V5 and 2 mm  of ST-segment elevation in II, III and aVF consistent with acute  anterior ST-elevation myocardial infarction.  He is started on IV  nitroglycerin, heparin, and aspirin without relief and was transferred  to our facility for emergent cardiac catheterization.  The patient did  have an episode of similar chest pain 2 nights prior that last  approximately 45 minutes and then abated that he felt was just  heartburn.  The patient has not seen medical doctor regularly and has  been on no medications.  He does have a history of chronic tobacco use.  For details of his past medical history, social history, family history,  and physical exam please see admission history and physical.   LABORATORY DATA:  Chest X-ray showed no active disease.  ECG was as  noted.  White count is 10,400, hemoglobin 16.5, and hematocrit 49.3.  Sodium 135, potassium 3.3, chloride 102, CO2 of 21, glucose 143, BUN 9,  and creatinine 1.01.  LFTs were all normal.  Protime was 21.5 with an  INR of 1.8 and PTT was greater than 200.  This was after having received  heparin and Angiomax.  Initial point-of-care cardiac enzyme markers were  negative with a CPK-MB of 1.9 and troponin of less than 0.05.  Subsequent CPK was 95  with 2.2 MB, then 5025 with 581.2 MB, then reduced  to 4235 with 482 MB.  Troponins went from less than 0.05 to 0.05 to  greater than 100 x2.  Magnesium level is 2.0.  Total cholesterol is 178  with triglycerides of 33, HDL 24 and LDL of 147.  BNP level was 345.  A1c was 5.7%.   HOSPITAL COURSE:  The patient was taken emergently to the cardiac  catheterization laboratory.  This demonstrated the LAD as a large vessel  that was occluded in the midvessel.  There is TIMI grade 0 flow.  The  left circumflex coronary had a 70% proximal stenosis followed by 70%  stenosis in the mid OM1, which is a small caliber vessel at this point.  There is diffuse distal disease in the circumflex up to 30%.  Right  coronary artery had 20-30% disease in the proximal mid vessel, and the  PDA was diffusely diseased up to 60-70%.  Left ventricular angiography  during the procedure demonstrated mid-to-distal anterior and distal  inferior akinesia with apical dyskinesia and overall ejection fraction  approximately 40-45%.  The patient underwent emergent stenting of the  mid LAD using 3 sequential stents 3.5 x 28 Xience stent followed by 3.5  x 12 Xience stent and a 3.5 x 15 Xience stent, these were all  postdilated up to 3.75 mm with excellent angiographic result 0%  residuals stenosis and TIMI grade 3 flow.  With restoration of flow, the  patient's chest pain resolved.  He had evolving changes of anterior  myocardial infarction on ECG with still persistent significant ST  elevation in the anterolateral and inferior leads that persisted for  several days.  He had Q-waves developed in anterior precordial leads as  well.  Cardiac enzymes increased as noted.  The patient had no  subsequent chest pain during his hospital stay.  He had no evidence of  congestive heart failure.  With reperfusion, he had lot of reperfusion  arrhythmias including nonsustained ventricular tachycardia and  accelerated lidoventricular  rhythms.  Within the first 24 hours, he had  repeated runs of nonsustained ventricular tachycardia one longest  episode lasting 59 beats.  These were treated with beta-blockade,  repletion of potassium and magnesium.  As his hospital course  progressed, he had some mild episodes of nonsustained ventricular  tachycardia lasting 8-9 beats, but in the 24 hours prior to discharge,  he had no arrhythmias.  An echocardiogram was obtained.  This  demonstrated akinesia of the mid and distal anterior septal wall,  akinesis of the apex, and distal inferior wall, left ventricle wall  thickness has mildly increased.  Ejection fraction was estimated at 40%.  There is no left ventricular thrombus noted.  The aortic valve was  mildly thickening calcified without significant stenosis.  Otherwise,  valvular appearance and structure were normal.  There was a very small  pericardial effusion anteriorly.  The patient was counseled on smoking  cessation.  He was progressively ambulated with cardiac rehab and did  very well.  It was felt that he was stable for discharge on August 30, 2008.  He was started on statin therapy for hypercholesterolemia.  He  was treated with beta-blocker therapy, ACE inhibitor, aspirin, and  Plavix.   DISCHARGE DIAGNOSES:  1. Acute anterior lateral ST-elevation myocardial infarction.  2. Nonsustained ventricular tachycardia.  3. Hypertension now controlled.  4. Hypercholesterolemia.  5. Tobacco abuse.   DISCHARGE MEDICATIONS:  1. Coated aspirin 325 mg per day.  2. Plavix 75 mg daily.  3. Pravastatin 40 mg daily.  4. Ramipril 5 mg daily.  5. Metoprolol 50 mg twice a day.  6. Nitroglycerin 0.4 mg sublingual p.r.n.   The patient is instructed on low-sodium heart-healthy diet and  instructed not to return to work for 2 weeks.  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  He is given smoking cessation counseling.  He will advance activity as  instructed by cardiac rehab.  The patient will  follow up with Dr. Swaziland  in 2 weeks.  Discharge status is improved.           ______________________________  [name], [name].     PMJ/MEDQ  D:  08/30/2008  T:  08/30/2008  Job:  [id number]

## 2011-01-08 NOTE — Discharge Summary (Signed)
NAME:  LEDGER, HEINDL NO.:  [id number]   MEDICAL RECORD NO.:  [id number]          PATIENT TYPE:  INP   LOCATION:  4702                         FACILITY:  [facility]   PHYSICIAN:  [name], [name].  DATE OF BIRTH:  04-Jun-1956   DATE OF ADMISSION:  03/16/2009  DATE OF DISCHARGE:  03/19/2009                               DISCHARGE SUMMARY   HISTORY OF PRESENT ILLNESS:  Mr. [name] is a 55 year old white male  with a complex history this year.  He suffered an acute anterior  myocardial infarction in January 2010, treated with direct stenting of  the LAD.  He did well following this and returned after discharge with  an out of hospital cardiac arrest.  He subsequently had a defibrillator  placed and was on amiodarone for a period of several months and then  this was discontinued.  In June, he was admitted with acute febrile  illness and had staph bacteremia.  His ICD was explanted.  He had a PICC  line placed and he has been on IV Ancef since that time with a plan to  complete 6-week course of antibiotics.  On the day of admission, he  awoke with acute substernal chest pain with a pleuritic component.  This  was associated with shortness of breath and diaphoresis.  ECG initially  at [facility] showed approximately 1 mm of ST elevation  inferiorly and biphasic anterior T-waves.  He was transferred emergently  as a code STEMI.  For details of his past medical history, social  history, family history, and physical exam, please see admission history  and physical.   LABORATORY DATA:  His chest x-ray showed cardiomegaly without CHF.  There was mild left basilar atelectasis.  His right-sided PICC line  terminated in the right atrium.  White count was elevated at 16,800,  hemoglobin 11.1, hematocrit 33.7, and platelets 480,000.  Cardiac  enzymes were negative x4.  Sodium is 136, potassium 4.0, chloride 102,  CO2 29, BUN 15, creatinine 0.96, glucose of 116, and  calcium was 9.3.  Liver function studies showed alkaline phosphatase of 140, albumin was  2.8, and all other studies were normal.  TSH was normal at 2.567.  A1c  was 5.2.  Sed rate was elevated at 123.   HOSPITAL COURSE:  The patient was taken directly to the Cardiac  Catheterization Laboratory by Dr. Amil Amen.  This demonstrated no  significant disease in the left main coronary.  There was a long stent  in the mid-LAD with only approximately 40% stenosis in the distal margin  of the stent.  This diagonal showed fairly extensive diffuse disease but  no high-grade stenoses.  There was a 50% stenosis in the mid circumflex.  Right coronary demonstrated diffuse luminal irregularities.  Left  ventricular angiography showed anterior and apical akinesia with an  overall ejection fraction estimated at 30%.  There was no culprit lesion  noted on this study.  He continued to have severe pleuritic-type  anterior chest pain.  He had an echocardiogram which demonstrated a mild-  to-moderate circumferential pericardial effusion.  There was  no definite  evidence of tamponade.  The left ventricle showed mid-to-distal anterior  akinesia with severe hypokinesia of the apex with overall ejection  fraction of 40-45%.  The left atrium was mildly dilated.  There were no  significant valvular abnormalities or vegetations.  Compared to a prior  study dated February 21, 2009, the pericardial effusion was new.  The  patient also had a CT of the chest.  This was performed with contrast.  There was no evidence of pulmonary embolus.  Again, a moderate  pericardial effusion was noted with a small left pleural effusion.  There was incidental finding of a 4.1 cm mass in the right adrenal  gland.  Based on these findings, his IV heparin was discontinued.  He  was started on nonsteroidal anti-inflammatory drugs with ibuprofen 400  mg t.i.d.  He had a very prompt response with early resolution of his  chest pain and shortness  of breath.  He developed no rub on exam.  His  white count declined to 11,200.  He had blood cultures x2 and urine  culture that were negative.  He was seen in consultation by Infectious  Disease who recommended continuing on his IV Ancef for a complete 6  weeks as previously outlined.  The patient was progressively ambulated.  As regards to his adrenal mass given his multiple comorbidities at this  time, the evaluation of this would be best postponed until after his  infection had completely been treated and cleared and his other cardiac  issues had resolved.  The patient was discharged home on March 19, 2009  in improved condition.   DISCHARGE DIAGNOSES:  1. Acute pericarditis.  2. Coronary disease status post anterior myocardial infarction in      January 2010.  3. History of out of hospital cardiac arrest, January 2010.  4. Acute staph bacteremia status post explantation of implantable      cardioverter defibrillator.  5. Hyperlipidemia.  6. Hypertension.  7. Ischemic cardiomyopathy.   DISCHARGE MEDICATIONS:  1. Amlodipine 10 mg per day.  2. Altace 10 mg daily.  3. Metoprolol 75 mg twice a day.  4. Crestor 20 mg daily.  5. Plavix 75 mg daily.  6. Aspirin 325 mg daily.  7. Skelaxin 800 mg t.i.d. as needed.  8. Prilosec 20 mg per day.  9. Ibuprofen 400 mg 3 times a day with food.  10.Ancef 2 grams IV every 8 hours.   FOLLOWUP:  The patient will follow up with Dr. Swaziland in his office this  week and we will repeat a CBC and sed rate.  We will also repeat a  limited echo exam to followup on his pericardial effusion.   DISCHARGE STATUS:  Improved.           ______________________________  [name], [name].     PMJ/MEDQ  D:  03/19/2009  T:  03/19/2009  Job:  [id number]   cc:   Hillis Range, MD  Fransisco Hertz, [name].

## 2011-01-08 NOTE — Cardiovascular Report (Signed)
[name], [name] NO.:  [id number]   MEDICAL RECORD NO.:  [id number]          PATIENT TYPE:  INP   LOCATION:  2901                         FACILITY:  [facility]   PHYSICIAN:  [name], [name].  DATE OF BIRTH:  [birth year]   DATE OF PROCEDURE:  03/16/2009  DATE OF DISCHARGE:                            CARDIAC CATHETERIZATION   PROCEDURES PERFORMED:  1. Left heart catheterization.  2. Coronary angiography.  3. Left ventriculogram.   INDICATION:  A 55 year old man with known CAD, status post LAD stenting,  January 2010, presents with acute chest pain and minimal ST-segment  elevation inferiorly.  Brought to the catheterization laboratory  emergently for coronary angiography and possible PCI.   PROCEDURE NOTE:  The patient was brought to the Cardiac Catheterization  Laboratory directly from [facility]ensive [facility].  He was placed  supine on the catheterization table and the right groin was prepped and  draped in the usual sterile fashion.  Local anesthesia was obtained with  infiltration of 1% lidocaine.  A 6-French catheter sheath was inserted  percutaneously into the right femoral artery utilizing an anterior  approach over guiding J-wire.  Left heart catheterization and coronary  angiography then proceed in a standard fashion using 6-French #4 left  and right Judkins catheters and a 110-cm pigtail catheter.  A 30-degree  RAO cine left ventriculogram was performed utilizing a power injector.  A 36 mL were injected at 13 mL per second.  At the completion of the  procedure, the catheter and catheter sheaths were removed.  Hemostasis  was achieved by direct pressure.  The patient was transported to the  recovery area in stable condition with an intact distal pulse.   HEMODYNAMICS:  Systemic arterial pressure was 110/70 with a mean of 72  mmHg.  There was no systolic gradient across the aortic valve.  The left  ventricular end-diastolic pressure was 18  mmHg, pre-ventriculogram.   Angiography:  Left ventriculogram demonstrated normal chamber size and  diminished left ventricular systolic function.  There was anterior  akinesis, apical, and inferoapical dyskinesis.  A visual estimated of  the ejection fraction was approximately 30%.  There was no mitral  regurgitation.  The aortic valve is trileaflet and opens normally during  systole.   There is a right-dominant coronary system present.  The main left  coronary artery shows no significant obstruction.   The left anterior descending artery and its branches are highly  diseased; there was an extensive stented segment within the midportion  covering approximately 35-40 mm of the artery.  In the more distal  segment of this, there is a focal in-stent restenosis of approximately  40-50%.  There was a large bifurcating proximal diagonal branch that is  diffusely diseased.  The inferior subbranch contains an 80% proximal  stenosis and a 90% midvessel stenosis.  It is overall small vessel.  The  ongoing anterior descending reaches and traverses the apex.  At the  apex, there is a tubular 75% stenosis.   The left circumflex coronary artery and its branches are highly  diseased; again the vessel was diffusely  diseased.  There are luminal  irregularities in the proximal segment.  In the midportion, there is a  tubular 50-60% narrowing.  The first large marginal then arises and is  diffusely diseased itself with multiple 70-80% stenosis.  The second  marginal branch is relatively small.  The third marginal which is on the  true obtuse margin is without significant obstruction.   The right coronary artery and its branches have diffuse disease and  luminal irregularities throughout, but no significant or fixed  significant stenosis.  It does give rise to moderate posterior  descending artery and a small posterolateral segment with two small left  ventricular branches.  In the proximal segment,  there is a completely  occluded RV branch that fills by late right-to-right collateral filling.   FINAL IMPRESSION:  1. Atherosclerotic coronary vascular disease, three-vessel.  2. Moderate-to-severe ischemic cardiomyopathy.  3. No clear evidence for an acute culprit lesion that would explain      the patient's chest pain.   PLAN:  1. The patient would be transported to the coronary care unit where      further evaluation for his symptoms will be undertaken by Dr.      Swaziland.  2. We will continue IV nitroglycerin, discontinue heparin, and      administer home medications as appropriate for his current blood      pressure.      [name], [name].  Electronically Signed     JHE/MEDQ  D:  03/16/2009  T:  03/16/2009  Job:  [id number]   cc:   [name], [name].

## 2011-01-08 NOTE — Consult Note (Signed)
NAME:  [name], [name] NO.:  [id number]   MEDICAL RECORD NO.:  [id number]          PATIENT TYPE:  INP   LOCATION:  3731                         FACILITY:  [facility]   PHYSICIAN:  Acey Lav, MD  DATE OF BIRTH:  05/05/1956   DATE OF CONSULTATION:  02/22/2009  DATE OF DISCHARGE:                                 CONSULTATION   REASON FOR INFECTIOUS DISEASE CONSULTATION:  Patient with Staphylococcus  aureus bacteremia and a defibrillator.   DISCUSSION:  For details, please see my resident's, Dr. Ozella Almond, note.  I have examined the patient, reviewed the medical record and agree with  the  assessment and plan as outlined in his note.  Briefly, this is a 55-year-  old Caucasian gentleman with a past medical history significant for  coronary artery disease status post percutaneous stent placement on  Plavix who had a ventricular fibrillation arrest and had an implantable  cardioverter-defibrillator implanted in January of 2010.  The patient  developed nausea, vomiting, fevers, rigors and chills this past Saturday  which persisted.  He was seen for evaluation in the ER on the 17th, and  had also by this time developed dyspnea and chest discomfort.  His labs  in the emergency department at that point in time were notable for white  blood cell count 15,000 and platelets of 129.  He was treated  presumptively for bronchitis with azithromycin and a bronchodilator.  The patient worsened, continued to have fevers, was readmitted to the  hospital on the following day on the 28th.  But this time, he was  continuing to have fevers.  His admission labs were notable for  worsening thrombocytopenia.  He had blood cultures drawn and was started  on levofloxacin, as well as vancomycin.  Since then, his blood cultures  have come back positive for Staphylococcus aureus with sensitivities  pending.   On exam, he is febrile, and his T maximum is 104 overnight.  His  pertinent exam  findings include a quite warm area over his ICD site.  To  my feeling, it is much hotter on this side compared to the other.  He  also has some tenderness in the axillary area just adjacent to this.  He  has had a 2/6 systolic murmur, as well.  His lungs have some crackles  posteriorly.  He has multiple excoriations on his fingers and hands, and  he has tattoos on his back.  The remainder of his exam was essentially  benign.   His labs are in Dr. Ozella Almond note and reviewed and pertinent now today  for a white count of 10.4, hemoglobin of 12.9 and platelets of 22, LDH  of 264.  Comprehensive metabolic panel showing creatinine of 0.191.  Slightly elevated transaminase, AST 53, alkaline phosphatase 125, lactic  acid 2.5.  He had a transthoracic echocardiogram which failed to show  any evidence of vegetations.   SUMMARY:  This is a 55 year old gentleman who has staph aureus  bacteremia with high suspicion in my mind for infected defibrillator,  given the warmth about the pocket and tenderness just adjacent to  this.  The patient is currently on vancomycin, and we are adding cefazolin.  We  are placing him on contact precautions until the sensitivities can be  obtained on the Staphylococcus aureus.  Certainly, I agree with getting  a transesophageal echo to better evaluate this, but given his  presentation and exam, I am very suspicious that he has an infection of  this pacer site.  I strongly recommend removal of the defibrillator  followed by 6 weeks of parenteral antibiotics, at which point in time  the defibrillator could be reimplanted.  As far as the patient's  thrombocytopenia, this antedates his  antibiotics, so I do not think the antibiotics are implicated in causing  this.  It is more likely due to his overall severe infection.   Thank you for this infectious disease consultation.  Will follow along  closely.      Acey Lav, MD  Electronically Signed     CV/MEDQ   D:  02/22/2009  T:  02/22/2009  Job:  [id number]   cc:   [name], [name].  Dr. Johney Frame, Electrophysiology

## 2011-01-08 NOTE — H&P (Signed)
NAMELENNOX, DOLBERRY NO.:  [id number]   MEDICAL RECORD NO.:  [id number]          PATIENT TYPE:  INP   LOCATION:  2901                         FACILITY:  [facility]   PHYSICIAN:  [name], [name].  DATE OF BIRTH:  25-Mar-1956   DATE OF ADMISSION:  03/16/2009  DATE OF DISCHARGE:                              HISTORY & PHYSICAL   REASON FOR ADMISSION:  Chest pain.   HISTORY OF PRESENT ILLNESS:  Mr. Rentz is a 55 year old man with an  extensive cardiac history dating to January 2010 when he experienced an  acute anterior wall myocardial infarction treated by direct stenting by  Dr. Swaziland.  He did reasonably well and was discharged within 1 week but  then returned 10 days later after out of hospital and observed at rest.  He was resuscitated and brought to [facility] where coronary  angiography revealed an early re-stenosis within the stented segment of  about 50-60%.  He received balloon angioplasty for this and subsequently  recovered fully.  I believe he had systemic cooling.  Then in June 2010,  he returned to the hospital with fever and chills and was found to have  staph bacteremia.  An ICD that had been placed in late January 2010 was  explanted and a PICC line placed.  He was sent home on IV antibiotics.   About 0500 this morning, he awoke with acute substernal anterior chest  pain that was crushing in nature and without radiation.  It was  associated with significant shortness of breath and diaphoresis.  He was  taken to [facility] Emergency Room where an electrocardiogram showed  perhaps 1-mm ST-segment elevation inferiorly and new biphasic anterior T-  waves.  Acute STEMI was activated and the patient was brought to the  Cardiac Catheterization Laboratory at [facility].  At the time of  arrival, his blood pressure was stable.  He was complaining of 8/10  chest pain and difficulty breathing, especially if he lays down.   PAST MEDICAL HISTORY:  As  outlined above with acute anterior MI in  January 2010, acute stenting, subsequent out of hospital arrest, and  PTCA of the LAD.  ICD placement with subsequent explant in June 2010 due  to staph bacteremia.  He also has hyperlipidemia, hypertension, and  known severe LV dysfunction/severe ischemic cardiomyopathy.   CURRENT MEDICATIONS:  1. Norvasc 10 mg p.o. daily.  2. Altace 10 mg p.o. daily.  3. Metoprolol 75 mg p.o. b.i.d.  4. Crestor 20 mg p.o. daily.  5. Plavix 75 mg p.o. daily.  6. Aspirin 325 mg p.o. daily.  7. Skelaxin 800 mg p.o. t.i.d.  8. Tramadol and acetaminophen 50/325 one p.o. b.i.d.   DRUG ALLERGIES:  None known.   FAMILY HISTORY:  There is a strong family history of coronary artery  disease at a young age.   SOCIAL HISTORY:  He works as a Curator.  He continues to smoke.  He is  married for the past 35 years.   REVIEW OF SYSTEMS:  Not obtainable.  The patient has been heavily  sedated.  PHYSICAL EXAMINATION:  VITAL SIGNS:  Blood pressure 130/70, pulse 78 and  regular, respiratory rate 18, and temperature afebrile.  GENERAL:  This is an alert, oriented 55 year old man, supine on the  catheterization table complaining of chest pain.  HEENT:  Unremarkable.  NECK:  Supple without thyromegaly or masses.  The carotid upstrokes are  normal without bruit.  CHEST:  Clear with adequate excursion.  HEART:  Regular rhythm.  Normal S1-S2 is heard.  No murmur, click, or  rub.  ABDOMEN:  Soft and nontender.  No midline pulsatile mass.  EXTERNAL GENITALIA:  Without lesions.  RECTAL:  Not performed.  EXTREMITIES:  Full range of motion.  No edema.  Intact distal pulses.  NEUROLOGIC:  Nonfocal.  SKIN:  Cool, dry, and without rash.   LABORATORY DATA:  Electrocardiogram as described above.  Only laboratory  available is CBC and is normal.  I-STAT creatinine is 0.9.   ASSESSMENT:  1. Acute chest pain, seems likely ischemic with suspicious but not      diagnostic acute  electrocardiographic changes.  2. Multiple risk factors for coronary disease.  3. Persistent tobacco abuse.  4. Ischemic cardiomyopathy.  5. Status post implantable cardioverter defibrillator explant.  6. History of staph bacteremia, on IV antibiotics, ? methicillin-      resistant Staphylococcus aureus.   PLAN:  The patient is to undergo urgent coronary angiography, possible  PCI.  Goals and risks were reviewed briefly.  Consent is emergent and  implied.  We will continue heparin, aspirin, Plavix, and outpatient meds  as appropriate.      [name], [name].  Electronically Signed     JHE/MEDQ  D:  03/16/2009  T:  03/16/2009  Job:  [id number]

## 2011-01-08 NOTE — Group Therapy Note (Signed)
[name], [name]              ACCOUNT NO.:  [id number]   MEDICAL RECORD NO.:  [id number]          PATIENT TYPE:  INP   LOCATION:  IC12                          FACILITY:  [facility]   PHYSICIAN:  Melissa L. Ladona Ridgel, MD  DATE OF BIRTH:  1956-06-23   DATE OF PROCEDURE:  DATE OF DISCHARGE:                                 PROGRESS NOTE   SUBJECTIVE:  The patient was admitted to the hospital with nausea,  vomiting, and fever up to 102.  Continued that vomiting throughout the  weekend.  Was seen in the emergency room, and diagnosed with bronchitis  and given a Z-pack and an inhaler, Vicodin.  The patient returned to the  hospital with left-sided pleuritic chest pain worsening with cough and  bouts of shortness of breath as well as continued fever.  The patient  has been found to have positive blood cultures for gram-positive cocci,  and recently had an AICD placed and then status post a home ventricular  tachycardia arrest.  The patient also found to be thrombocytopenic  suggesting an infection and/or a Plavix-induced TTP.  The patient was  admitted to the ICU on telemetry status.  He was started on vancomycin  and Levaquin.  On the day of transfer to [facility], he  remained febrile. Temperature of 101.5 this morning, blood pressure  127/65, pulse 91, respirations 24, saturations 97%.  He has been  discontinued on his aspirin at this time as well as his Plavix, but  these should be reevaluated in light of thrombocytopenia is possible  because of his recent drug-eluting stent placement.  At this time the  patient is awake, alert.  His only complaint is his fever, and he denies  nausea or abdominal pain or  shortness of breath at this moment.  Again  vital signs this morning 101.5, blood pressure 127/65, pulse 91,  respirations 24, saturation 97%.  Telemetry monitor reveals normal sinus  rhythm, rates in the 90s for most of the evening, no arrhythmias noted.   GENERAL:  This is a  well-developed, moderately obese white male  currently in mild distress just because of fever.  He is normocephalic, atraumatic.  Pupils equal, round, reactive to  light.  Extraocular muscles appear to be intact.  Mucous membranes are  moist.  He is awake, alert, and oriented.  NECK:  Supple.  I did not appreciate any JVD sitting on the bedside.  CHEST:  Decreased, but clear to auscultation.  There are no rhonchi,  rales or wheezes.  CARDIOVASCULAR:  Regular rate and rhythm, positive S1, S2, no S3, S4.  I  do not appreciate a murmur, rub or gallop.  He is quite distant with his  heart sounds, however.  ABDOMEN:  Soft.  I do not appreciate any tenderness.  No guarding.  EXTREMITIES:  There is no clubbing, cyanosis or edema.  NEUROLOGIC:  Again awake, alert, oriented.  Cranial nerves appear to be  intact.   PERTINENT LABORATORIES SINCE ADMISSION:  Blood cultures are growing gram-  positive cocci in clusters in both bottles.  It has been identified as  staph aureus and still being speciated.  The patient's lipase is 12  within normal limits.  His sodium is 129, potassium 3.8, chloride 100,  CO2 25, BUN 12, creatinine 0.8, glucose is 110.  Bilirubin 1.8, alk phos  108.  SGOT 56.  ALT 51.  Total protein is 5.6.  His albumin is 24.  His  calcium is 8.9.  His CBC shows a white count of 8.1 with a hemoglobin of  12.6, hematocrit 35.7, and platelet count of 32.  This is decreasing  since admission when platlet levels were 45.   At this time an EKG does not appear to be available to me, and will ask  for an EKG to be done when he arrives at [facility].  I have reviewed his  ultrasound for this morning which is really relatively suboptimal for  telling us anything about the pain he was having in the right upper  quadrant.  It does say that the gallbladder is normally distended  without stones, no wall thickening, poor visualization of the common  bile duct gives you no information.  Intrahepatic  ducts appeared to be  mildly dilated, and there is a question of mild hepatic enlargement.  There is no focal hepatic masses or nodularity.   Please note this patient had a 2D echo completed this morning.  The  reading of that is not done yet, and his chest x-ray from the 27th when  he was in the emergency room prior to this admission showed no active  cardiopulmonary disease.   ASSESSMENT AND PLAN:  Patient is a 55 year old Caucasian man who  evidently had an acute anterior ST elevation myocardial infarction on  January 1.  He had an occluded mid left anterior descending and moderate  disease of the left circumflex and peripheral arterial disease.  Underwent emergent stenting of the mid left anterior descending at the  time with a drug-eluting stent.  He has been on aspirin and Plavix since  then.  The patient's course was complicated by nonsustained ventricular  tachycardia, no evidence of congestive heart failure.  The patient went  home and on the morning of readmission in January the patient presented  after cardiopulmonary resuscitation was initiated at home.  It was felt  that he probably had a ventricular tachycardia arrest.  The patient  evidently had an automatic implantable cardioverter-defibrillator  placement on January 14, and was discharged to home on January 15.  The  patient presented to [facility] emergency room with complaints of  shortness of breath, fever, nausea and vomiting which has persisted, and  he is growing gram-positive cocci in his blood at this time.  Because of  the positive blood cultures and thrombocytopenia, it is felt that the  patient should be transferred to [facility] to be closer to  cardiology who may need to intervene with regard to his recent automatic  implantable cardioverter-defibrillator placement.  I would recommend  that  infectious disease be involved if appropriate.  Cardiology was  spoken with last evening, and I will call  them again today to alert them  of the transfer.  At this time the patient currently is on vancomycin  day #2 and Levaquin day #2.   1. Nausea, vomiting of unclear significance possibly secondary to      bacteremia.  Symptoms are currently controlled.  2. Gram-positive cocci bacteremia currently on vancomycin and      Levaquin.  The patient will need further workup for possible  endocarditis or vegetations on his automatic implantable      cardioverter-defibrillator.  3. Ischemic cardiomyopathy with a history of ventricular fibrillation      and arrest.  Currently vital signs are stable.  Will continue his      medications which consist of metoprolol 5 mg intravenous q.6.  At      this time he may need additional medications for blood pressure      control.  4. Thrombocytopenia.  May be secondary to aspirin and Plavix, and at      this time they have been discontinued.  Reevaluation for      continuation of those medications is going to  be imperative, as he      recently has Taxus stent.  5. Abnormal liver function tests again may be related to the aspirin      and Plavix.  At this time the ultrasound is not showing any active      gallbladder disease, but further evaluation may be necessary.   At this time I am preparing the patient to transfer to [facility], and have alerted the [facility] call pager and will  call Dr. Swaziland and alert him of the patient's transfer.  Total time on  this case was 35 minutes.      Melissa L. Ladona Ridgel, MD  Electronically Signed     MLT/MEDQ  D:  02/21/2009  T:  02/21/2009  Job:  [id number]

## 2011-01-08 NOTE — Discharge Summary (Signed)
NAME:  [name], [name] NO.:  [id number]   MEDICAL RECORD NO.:  [id number]          PATIENT TYPE:  INP   LOCATION:  2020                         FACILITY:  [facility]   PHYSICIAN:  [name], [name].  DATE OF BIRTH:  Mar 15, 1956   DATE OF ADMISSION:  09/02/2008  DATE OF DISCHARGE:  09/09/2008                               DISCHARGE SUMMARY   HISTORY OF PRESENT ILLNESS:  Mr. [name] is a 55 year old white male  recently admitted from August 26, 2008 until August 30, 2008 after  suffering acute anterior ST elevation myocardial infarction.  He had  emergent cardiac catheterization at that time showed an occlusion of the  mid LAD.  He had modest disease in the left circumflex and PDA  distribution.  He underwent stenting extensively of the mid LAD at that  time.  His course was only remarkable for 4 episodes of nonsustained  ventricular tachycardia, which were asymptomatic.  He did well and was  discharged on August 30, 2008.  The patient had no evidence of  congestive heart failure or recurrent angina.  The patient did well  initially at the home.  However, on the morning of readmission on  September 02, 2008, wife noticed that the patient had labored respirations  and was unresponsive, daughter began CPR and on subsequent EMS arrival,  the patient was shocked with an AED.  Initially noted by EMS was sinus  rhythm with nonsustained ventricular tachycardia.  The patient was  intubated and initially presented to [facility], ECG at that  time showed persistent ST elevation, which had been noted even prior to  his discharge.  He had persistent ST elevation following anterior  myocardial infarction.  He was transferred here emergently for further  intensive therapy.  The patient did not require any vasoactive drugs at  that time.  There was no preceding chest pain or symptoms of dyspnea.  For details of his past medical history, social history, family history,  and  physical exam, please see admission history and physical.   LABORATORY DATA:  Initial chest x-ray showed ET tube in appropriate  position.  There was no diagnostic pneumothorax.  He had a left  subclavian central line placed.  There was no edema noted at that time.  ECG was remarkable for significant anterior ST elevation with Q-waves  from V1 through V5.  There was also still persistent ST elevation in  inferior leads, which was similar to ECG prior to discharge from his  previous hospital stay.  Additional laboratory data initial arterial  blood gas showed a pH of 7.27, pCO2 of 42, pO2 of 287, and bicarb of 19  on 100% FiO2.  Hemoglobin was 15.3, hematocrit 45.9, white count was  20,300, and platelet was 320,000.  Sodium 141, potassium 4.8, chloride  112, CO2 of 22, BUN 17, creatinine 1.08, glucose of 118.  Coags were  normal.  Liver function studies were normal.  Albumin was 3.2.  Initial  cardiac enzymes showed a troponin of 2.14.  CK was 108 with 4.7 MB.  Subsequent troponins increased to 2.57, 2.79, 3.02,  and 3.64.  CPK  increased to 2320 with 57.1 MB, 2540 with 83.5 MB, 2506 with 107.1 MB,  and then 2746 with 21 MB.  Calcium was 8.3, magnesium 2.0.  Drug screen  was positive for marijuana metabolites and benzodiazepines.  BNP level  was 971.   HOSPITAL COURSE:  Given the concern of his cardiac arrest with recent  stenting of the LAD and persistent ST elevation, the patient was taken  emergently to the cardiac catheterization laboratory.  This demonstrated  the LAD was still widely patent.  There was a focal area in the mid LAD  within a long extended segment that appeared to have an underexpanded  stent with about a 50% stenosis, the left circumflex coronary continued  to show 50% to 70% stenosis proximally with a focal 50% to 70% stenosis  in the mid OM1.  The PDA had a 50% to 60% stenosis.  Left ventricular  function was severely reduced with ejection fraction of 30% to 35%  with  anterior, lateral, and distal inferior wall akinesia and apical  dyskinesia.  While it was felt that the underexpanded stent was not the  cause of his cardiac arrest, we did go ahead and dilate this to assure  optimal stent expansion using a 3.75 mm x 12 mm [place] Voyager balloon up to  16 atmospheres yielding an excellent result and good stent expansion and  0% residual stenosis.   At this point, Critical Care was also involved in the patient care.  His  ET tube was changed out.  He had a central line placed.  He was  continued with ventilator support.  He was started on cooling protocol  with iced saline and arctic sun cooling for his cardiac arrest.  Initially, the patient was severely hypertensive, but blood pressure  subsequently returned to normal levels.  He did not require any pressor  support.  He had an NG placed.  His serial cardiac enzymes were as  noted, which were felt to be consistent with his CPR.  Serial EKGs did  show gradual improvement of his ST elevation, but with persistent Q-  waves across the anterior precordium.  The patient was sedated and  paralyzed per Critical Care protocol.  He maintained an excellent urine  output.  His renal function remained normal.  He was effectively cooled.  On subsequent evaluation on September 02, 2008, the patient did have  recurrent nonsustained ventricular tachycardia and was started on IV  amiodarone.  On September 03, 2008, the patient had a prolonged episode of  polymorphic ventricular tachycardia requiring emergent cardioversion x4.  His QT was prolonged at this time.  QTC was 550 seconds post arrest.  He  was given magnesium as his magnesium level is 1.8.  He aggressively  repleted potassium.  He was continued on IV amiodarone.  Subsequent QTc  returned to normal.  It was unclear at this time whether this  represented ischemic event or possibly related to rewarming.  The  patient's rhythm subsequently remained stable throughout  his hospital  stay.  He still had episodes of nonsustained ventricular tachycardia  less than 8 beats in length and was asymptomatic.  He had no further  polymorphic ventricular tachycardia.  He was extubated on September 04, 2008.  He did exhibit features of anoxic encephalopathy post extubation  with persistent confusion and disorientation.  He had no focal  neurologic findings.  His chest x-ray did demonstrate some mild  pulmonary edema and he was  effectively diuresed.  Prior to discharge,  his chest x-ray showed no pulmonary edema and his BNP level had declined  to 574.  The patient was continued on oral beta blocker and ACE  inhibitor therapy.  He was continued on aspirin and Plavix.  We  increased his metoprolol to a maximum dose of 75 mg b.i.d.  There was  some concern that he had some aspiration and bronchoalveolar lavage  prior to extubation that was positive for staph aureus, he was therefore  treated with Zosyn for a period of approximately 5 days.  He had no  fever and his white count returned to normal.  His chest x-ray findings  as noted improved.  The patient was progressively ambulated with cardiac  rehab therapy.  He was seen in consultation by Dr. Hillis Range for  electrophysiologic evaluation.  It was recommended the patient undergo a  placement of implantable cardiac defibrillator.  He was switched from IV  to oral amiodarone.  He was transferred to telemetry floor and continued  to make good progress.  He still remained confused up until the last day  of his hospital stay, at which time he was oriented to all except  appropriate hospital.  He knew the date, the physician's name and his  surroundings.  On September 08, 2008, the patient did undergo placement of  ICD.  He had defibrillation thresholds less than 15 joules.  He had no  inducible VT on amiodarone.  Following this, he did have some mild  swelling in his ICD site, but it was soft and nontender.  There was no   drainage or evidence of infection.  The patient had made excellent  progress with cardiac rehab.  He was evaluated by speech, occupational  and physical therapy.  Occupational therapy did not see any additional  needs.  Physical therapy recommended outpatient physical therapy for  high level balance training post discharge.  Speech therapy also  recommended outpatient evaluation.  At this point, it was felt that the  patient had reached the maximum benefit for hospital stay and he was  discharged home in stable condition on September 09, 2008.   DISCHARGE DIAGNOSES:  1. Out of hospital cardiac arrest secondary to arrhythmia.  2. Polymorphic and monomorphic ventricular tachycardia.  3. Status post recent anterior myocardial infarction treated with      extensive stenting of the mid LAD and subsequent reangioplasty of      the focal area within the stent.  4. Severe left ventricular dysfunction with congestive heart failure.  5. Acute on chronic congestive heart failure due to systolic      dysfunction.  6. Hypertension.  7. Anoxic encephalopathy, improving  8. Hypokalemia.  9. Hypomagnesemia.  10.Prior tobacco use.   DISCHARGE MEDICATIONS:  1. Coated aspirin 325 mg per day.  2. Plavix 75 mg per day.  3. Ramipril 10 mg per day.  4. Pravastatin 40 mg per day.  5. Amiodarone 200 mg twice a day for 2 weeks and then once a day.  6. Metoprolol 50 mg 1-1/2 tablet twice a day.  7. Lasix 40 mg per day.  8. Potassium 20 mEq 2 tablets daily.  9. Magnesium oxide 400 mg per day.  10.Nitroglycerin 0.4 mg sublingual p.r.n.  11.Darvocet-N 100 q.4-6 h. p.r.n.   DISCHARGE INSTRUCTIONS:  The patient was instructed for cardiac rehab  with gradual increase activity.  Recommend follow up phase II cardiac  rehab program.  Dr. Johney Frame gave him instructions on  his ICD site to keep  dry for the next 7 days and then sponge bath until September 15, 2008.   FOLLOWUP:  We will arrange followup in the [facility] on Monday,  September 26, 2008, at 9 a.m., with followup to see Dr. Johney Frame on December 16, 2008 at 9:10 a.m.  We will arrange followup with Dr. Swaziland in 2  weeks.   DIET:  He is on a low-sodium heart-healthy diet.   ACTIVITY:  It was noted that the patient will not be able to drive a  motor vehicle for the next 6 months at least per Dr. Johney Frame.  It was  recommended that he not be exposed to constantly running motors, which  had been his prior job working as a Curator for foreign vehicles.  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  We encouraged the wife to  apply for disability.  Arrangements were made for his physical and  speech therapy followup postdischarge.   DISCHARGE STATUS:  Improved.           ______________________________  [name], [name].     PMJ/MEDQ  D:  09/09/2008  T:  09/10/2008  Job:  [id number]   cc:   Hillis Range, MD  Charlcie Cradle. Delford Field, MD, FCCP

## 2011-01-08 NOTE — Consult Note (Signed)
[name], [name]              ACCOUNT NO.:  [id number]   MEDICAL RECORD NO.:  [id number]          PATIENT TYPE:  INP   LOCATION:  3704                         FACILITY:  [facility]   PHYSICIAN:  Lennis P. Darrold Span, [name].DATE OF BIRTH:  March 16, 1956   DATE OF CONSULTATION:  02/22/2009  DATE OF DISCHARGE:                                 CONSULTATION   The patient is a 55 year old white male with a complicated medical  course since January 2010, seen in consultation at the request of the  hospitalist service for thrombocytopenia and anemia in the setting of an  acute febrile illness with positive blood cultures.   History is significant for an acute MI on August 26, 2008, which was  treated with stent to the LAD and medication including aspirin and  Plavix.  CBC at presentation with the MI on August 26, 2008, white count  10.4, hemoglobin 16.5, and platelets 328.  He was discharged home on  August 30, 2008.  He had an acute cardiac arrest secondary to arrhythmia  on September 02, 2008 at home and was hospitalized through September 09, 2008.  He had an ICD placed; had initial anoxic encephalopathy, which  improved; and had bronchoalveolar lavage prior to extubation with staph  aureus, which was treated with Zosyn.  Plavix was continued with his  other medications at discharge.  CBC on September 08, 2008, white count of  11.8, hemoglobin 13.8, and platelets 377.  Per the patient and his wife,  he had been doing well at home until the evening of February 17, 2009 when  he had sudden onset of nausea, vomiting, and fever as high as 102  degrees.  He was seen at the [facility] Department on February 19, 2009 with aches, fever, cough, and headache.  CBC then had white count  of 15.4, hemoglobin 13.4, and platelets 129 with chest x-ray clear and  one blood culture done, which is now growing staph aureus.  He was begun  on a Z-PAK.  He was seen back and admitted to [facility] on February 20, 2009 with  ongoing chills, aches, and pleuritic left chest pain as well  as temperature of 100.9.  He was subsequently transferred to [facility].  He was begun on Levaquin and vancomycin, and is presently on cefazolin  and vancomycin.  One blood culture from June 28, also is growing staph  aureus.  The nausea and vomiting have resolved, and he is taking some  clear liquids.  Chest x-ray today has a new subtle right upper lobe  density and his cough continues productive of thick, green and brown  sputum.  Temp recorded at [facility] has been from 98.5 to 101.5.  Abdominal  ultrasound, June 29, shows mild intrahepatic biliary diltation with poor  visualization of the common bile duct.  Echocardiogram, June 29, had  ejection fraction of 45%, a small mural thrombus in the left ventricle,  valves apparently not remarkable.  TEE is planned by Dr. Swaziland.  ID  consult is in process.   The patient did have heparin with his acute  cardiac events in January  2010, but does not appear to have had heparin exposure this admission.  He has had no overt bleeding.  Aspirin and Plavix are held.  Labs from  June 27, white count 15.4, hemoglobin 13.4, platelets 129.  On June 28,  white count 7.8, ANC 7.4, hemoglobin 13.8, platelets 45,000, bands  greater than 20%, and atypical monocyte.  From June 29, white count 8.1,  hemoglobin 12.6, and platelets 32,000.  From June 30, white count 10.4,  hemoglobin a little improved at 12.9, and platelets 22,000.  Chemistries  from June 30, sodium 133, potassium 3.6, chloride 102, CO2 of 28,  glucose 120, BUN 11, creatinine 0.9, total bili 3, alk phos 125, GOT 53,  GPT 40, total protein 5.9, albumin 2.3, calcium 7.9, and LDH 264.  PT  15.3, INR 1.2, PTT 43, fibrinogen 702, and D-dimer is 8.  Smear reviewed  by tech called no schistocytes; however, on my review now, schistocytes  are present, no immature white cells, decreased platelets without giant  platelets seen.  Acute hepatitis panel  is negative.  Haptoglobin and  hemoccult cards are pending.   PAST MEDICAL HISTORY:  MORPHINE causes itching.  He had a remote ankle  fracture, and otherwise cardiac history as above.   FAMILY HISTORY:  CVA and coronary artery disease in his parents.   SOCIAL HISTORY:  He is married, lives with his family in [place].  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  Cigarettes, one pack daily for 30 years.  Denies alcohol.  Drug screen  in January positive for marijuana.   REVIEW OF SYSTEM:  Headache present since the illness began.  No other  nausea.  Normal bowel movements now.  Upper back and bilateral shoulders  are uncomfortable.  No bleeding or bruising.   PHYSICAL EXAMINATION:  VITAL SIGNS:  Last vitals in the EMR from 0600,  temperature 100.4, blood pressure 162/80, heart rate 90, respirations  20, and 94% saturated on room air.  In the EMR that I can find with IV  fluid, normal saline, and 20 of potassium at 100 an hour.  GENERAL:  He is awake, alert, cooperative, and oriented, but appears  ill, lying supine in bed.  His wife is present.  HEENT:  Pupils are equal and reactive.  Sclerae not clearly icteric.  Oral mucosa moist, pink, and clear.  NECK:  Supple.  LUNGS:  Breath sounds bilaterally without wheeze.  Somewhat diminished  air movement.  HEART:  Regular rate and rhythm.  ABDOMEN:  Soft, quiet, and nontender.  No appreciable organomegaly.  EXTREMITIES:  Lower extremities:  No edema, chords, or tenderness.  No  bleeding at peripheral IV sites.  No petechiae or ecchymosis.  He moves  all extremities easily.  Cranial nerves intact.  Speech fluent.   IMPRESSION AND RECOMMENDATION:  1. New thrombocytopenia, which most likely is secondary to infection      with positive blood cultures and evidence of some DIC.  Evaluation      and treatment of the febrile illness is in process.  We would      follow CBC, transfuse platelets if bleeding, hold the  aspirin and      Plavix.  With infectious process, this is much more likely then      TTP.  I doubt he had has no documentation of any heparin since      January, and again we have reasons, otherwise, with  the infectious      illness.  2. Drop in hemoglobin without overt bleeding, not clearly hemolyzing      though haptoglobin is pending, LDH minimally up, and slight      increase in bilirubin with some biliary ductal dilatation on the      ultrasound.  Drop may be with rehydration.  Would follow.   I will see him with you.  Please call prior to my visit if needed.      Lennis P. Darrold Span, [name].  Electronically Signed     LPL/MEDQ  D:  02/22/2009  T:  02/23/2009  Job:  [id number]   cc:   [name], [name].

## 2011-01-08 NOTE — H&P (Signed)
NAME:  [name], [name] NO.:  [id number]   MEDICAL RECORD NO.:  [id number]          PATIENT TYPE:  EMS   LOCATION:  ED                            FACILITY:  [facility]   PHYSICIAN:  Billy Shipper, MD     DATE OF BIRTH:  10/04/1955   DATE OF ADMISSION:  02/20/2009  DATE OF DISCHARGE:  LH                              HISTORY & PHYSICAL   PRIMARY CARE PHYSICIAN:  The patient does not have a PMD.  He is  followed by Dr. Peter [name] who is a cardiologist at [facility].   ADMISSION DIAGNOSES:  1. Fever, etiology unclear.  2. Positive blood cultures.  3. Thrombocytopenia, acute.  4. History of coronary artery disease.   CHIEF COMPLAINT:  Fever, nausea, vomiting.   HISTORY OF PRESENT ILLNESS:  The patient is a 55 year old Caucasian male  who has a past medical history of coronary artery disease including an  MI earlier this year followed by cardiac arrest and status post AICD.  All this was accomplished in January of this year.  He had three stents  placed to his mid LAD at that time.   He said he was in his usual state of health until Friday evening when he  had dinner at [employer].  The following morning, Saturday morning, he  developed nausea, vomiting, but denied any diarrhea.  He denies any  abdominal pain.  He had fever up to 102 degrees Fahrenheit.  He continue  to vomit and have dry heaves all Saturday and decided to come in to the  hospital yesterday.  He was diagnosed with bronchitis and respiratory  tract infection and was given a prescription for Z-pack and an inhaler  and Vicodin.  The patient continued to have chills and body aches, did  not feel any better, and so decided to come back in to the hospital  today.  He is complaining of left-sided pleuritic chest pain, worse with  cough, radiating from the left chest to the left flank area.  He has  been coughing up brown-green-yellowish expectoration with no blood.  Denies any leg swelling.  Please note that  he states that he initially  felt some discomfort in the lower abdomen which resolved.  He also has  been having shortness of breath worse than before.  He denies any sick  contacts.  Denies any fainting episodes or dizziness.  Denies any recent  travel outside this area.  Denies any urinary complaints, any joint  swelling, skin rashes.   MEDICATIONS AT HOME:  He is on the following:  1. Ramipril 15 mg daily.  2. Plavix 75 mg daily.  3. Aspirin 325 mg daily.  4. Crestor 20 mg at bedtime.  5. Metoprolol 75 mg b.i.d.  6. Magnesium oxide 400 mg.  I think he takes it twice a day.   ALLERGIES:  No known drug allergies.   PAST MEDICAL HISTORY:  1. Positive for coronary artery disease, status post acute ST      elevation MI, status post PTCI.  2. He is also status post cardiac arrest with possible prolonged QT  interval.  He is status post AICD placement.  3. During that admission he was also found to have polymorphic and      monomorphic ventricular tachycardia.  4. He has a history of hypertension.  5. Anoxic encephalopathy at that time for which he was intubated.  6. He also had hypokalemia and hypomagnesemia at that time.  7. History of tobacco abuse is also present.  8. He has also had a fracture of his right ankle in the past.   SOCIAL HISTORY:  He smokes a few cigarettes on a daily basis.  Denies  any alcohol.  Denies any illicit drug use.  Lives in [place] with his  family.  He is currently on disability.  He used to be a Curator.   FAMILY HISTORY:  Positive for coronary artery disease, stroke, and  hypertension.   REVIEW OF SYSTEMS:  GENERAL:  Positive for weakness.  HEENT:  Unremarkable.  CARDIOVASCULAR:  As in HPI.  RESPIRATORY:  As in HPI.  GASTROINTESTINAL:  As in HPI.  GENITOURINARY:  Unremarkable.  NEUROLOGIC:  Unremarkable.  PSYCHIATRIC:  Unremarkable.  DERMATOLOGIC:  Unremarkable.  Other systems unremarkable.   PHYSICAL EXAMINATION:  VITAL SIGNS:   Temperature here was 100.9, blood  pressure 132/75, recheck 120s over 60s, heart rate 82, respiratory rate  22, saturation 100% on room air.  GENERAL:  This is a well-developed, well-nourished, white male in no  distress.  HEENT:  There is no pallor, no icterus.  Oral mucosal membrane is  slightly dry.  No oral lesions are noted.  Head is normocephalic,  atraumatic.  NECK:  Soft and supple.  No thyromegaly is appreciated.  LUNGS:  Reveal few bibasilar crackles which reduce with deep breathing.  No wheezing is appreciated.  No other fine crackles are noted.  No  dullness to percussion.  CARDIOVASCULAR:  S1/S2 is normal, regular.  Systolic murmur appreciated  over the aortic area.  No S3/S4.  No rubs, no bruits, no JVD.  No pedal  edema is noted.  No diastolic murmur is appreciated.  ABDOMEN:  Soft.  There is some vague tenderness in the right upper  quadrant but no definite Murphy's sign.  No other tenderness present.  No masses or organomegaly is appreciated.  MUSCULOSKELETAL:  Exam unremarkable.  GENITOURINARY:  Exam unremarkable.  NEUROLOGIC:  She is alert, oriented x3.  No focal neurological deficits  are present.   LABORATORY DATA:  His white count is 7.8 today, was 15.4 yesterday;  however, peripheral smear shows greater than 20% bands.  Hemoglobin  13.8, MCV is 95, platelet count is 45,000.  It was 129,000 yesterday and  was normal earlier this year.  No schistocytes have been reported in  this peripheral smear.  Sodium is 130, potassium 3.7, chloride 99,  bicarb 22, glucose 153, BUN is 16, creatinine 1.07.  Total bilirubin is  1.9, direct 1, AST 78, ALT 63, alkaline phosphatase is 114.  Cardiac  enzymes negative today and negative yesterday.  BNP actually has not  been done.  Blood cultures grew Gram positive cocci in clusters, only  one tube was drawn yesterday.  They have been repeated today.   Imaging studies include a chest x-ray which shows no acute  cardiopulmonary  process.   EKG was done yesterday which shows a sinus rhythm at a rate of 84.  Intervals appear to be in the normal range.  Axis is normal.  He  probably has evidence for Q-waves in leads V3-V4.  No  old EKGs available  at this time to compare.   ASSESSMENT:  The patient is a 55 year old Caucasian male who presents  with nausea, vomiting, fever, chills, has positive blood cultures, has  thrombocytopenia, and has also a recent history of coronary artery  disease complicated by cardiac arrest.  Differential diagnosis at this  time is quite broad.  Regarding his fever and thrombocytopenia and  positive blood cultures this could be endocarditis. Infected AICD is  also a possibility. Abdominal tenderness along with nausea and vomiting  could be due to gastritis or a biliary process. He also has abnormal  LFTs which complicate issues further, however sepsis can cause abnormal  LFT's. He could have bronchitis considering other symptoms.  Thrombocytopenia could be due to sepsis, could be due to Plavix-induced  TTP, though that is less likely.  So in all this is a complicated  patient.   PLAN:  1. Fever with positive blood cultures.  Etiology unclear.  He has been      given vancomycin.  I will also give him Levaquin for now.  Blood      cultures have been repeated and they will be followed up on.  He      will be hydrated.  2. Abnormal LFTs.  He needs imaging studies of his abdomen to rule out      cholecystitis.  I do not know if there is capability to do an      ultrasound at night at [facility].  If not this will need to be      pursued further either in the morning or if there is acute need,      CAT scan can be done tonight.  Hepatitis profile will be also      ordered.  Sepsis could also be the reason for abnormal LFT's.  3. Thrombocytopenia, etiology unclear, possibly because of sepsis.      Could be secondary to Plavix-induced TTP, but I think sepsis is a      more likely reason  here.  We will obviously have to hold off on his      aspirin and Plavix.  We will not give him any medication that can      worsen his thrombocytopenia at this time.  He may require input      from hematology.  4. History of coronary artery disease, status post myocardial      infarction, status post stent, status post AICD. He does have drug-      eluding stents in his LAD. Appears to be stable at this time.  His      cardiac enzymes are negative.  I have spoken with Dr. Tawanna [name] who is      covering for Dr. Peter [name] and I have apprised him of this      patient, although he sounded like they would not have really      anything to add at this time.  However, if we are considering      endocarditis, I think this is something that they will have to be      involved in.  5. Possible bronchitis.  Treat with Levaquin.  No wheezing at this      time.  I will hold off on breathing treatments for now.   Because of a significant history of recent heart disease along with his  current presentation this patient needs a higher level of care than can  be provided at [facility]. I discussed this case with Dr. Mort [name] and she has accepted him in transfer.   Depending on results of the ultrasound, surgical consultation may be  required.  This patient may also require hematology input and even maybe  an ID input depending on how he does in the hospital.  I would  definitely involve  the cardiologist to follow this patient while he is there as patient may  require a TEE.  DVT prophylaxis with SCDs for now because of  history of  severe thrombocytopenia.   ADDENDUM: Patient could not be transferred that night due to  unavailability of beds at [facility].      Billy Shipper, MD  Electronically Signed     GK/MEDQ  D:  02/20/2009  T:  02/21/2009  Job:  [id number]   cc:   Billy M. [name], [name].  Fax: [phone]

## 2011-01-08 NOTE — Letter (Signed)
February 28, 2009     RE:  AUGUSTIN, BUN  MRN:  [id number]  /  DOB:  1955-11-02   To Whom It May Concern:   I am writing this letter to request assistance with payment for a Life  Vest defibrillator jacket for Mr. Merilynn Finland.  Mr. Vandevoorde is a very  pleasant 55 year old gentleman with a history of ventricular  fibrillation arrest, status post resuscitation, and implantation of an  ICD on September 08, 2008, for secondary prevention of sudden cardiac  arrest.  He has done well with his defibrillator, but recently developed  Staphylococcus aureus bacteremia and sepsis.  This necessitated urgent  removal of his defibrillator.  I therefore extracted his implantable  cardioverter-defibrillator on February 23, 2009.  He has been observed in the  hospital since that time and has had multiple episodes of nonsustained  ventricular tachycardia documented on telemetry.  He will require at  least 6 weeks of intravenous antibiotics and will likely have his  defibrillator reimplanted in 3 months.  In the interim, the patient will  require a Life Vest defibrillator vest for secondary prevention  measures.  Initial payment for the patient's LifeVest has been declined.  I would appreciate reconsideration of at least a 16-month lease for the  patient's Life Vest.  Hopefully at that time, we will be able to re-  implant an implantable cardioverter-defibrillator for secondary  prevention of sudden cardiac arrest.   Thank you for your assistance in this measure.    Sincerely,      Hillis Range, MD  Electronically Signed    JA/MedQ  DD: 02/28/2009  DT: 03/01/2009  Job #: [id number]

## 2011-01-08 NOTE — Discharge Summary (Signed)
[name], [name] NO.:  [id number]   MEDICAL RECORD NO.:  [id number]          PATIENT TYPE:  INP   LOCATION:  2002                         FACILITY:  [facility]   PHYSICIAN:  Charlestine Massed, MDDATE OF BIRTH:  01-16-1956   DATE OF ADMISSION:  02/21/2009  DATE OF DISCHARGE:  02/28/2009                               DISCHARGE SUMMARY   DISCHARGE DIAGNOSES:  1. Staphylococcus aureus - methicillin-sensitive Staphylococcus aureus      (MSSA) bacteremia.  2. Sepsis secondary to Staph aureus bacteremia.  3. Implantable cardioverter defibrillator (ICD) site infection with      Staphylococcus aureus leading to bacteremia - status post      implantable cardioverter defibrillator explantation.  4. A prior history of cardiopulmonary arrest with an implantable      cardioverter defibrillator, currently implantable cardioverter      defibrillator explanted  5. Previous ST-elevation myocardial infarction (MI) - ST-segment      elevation myocardial infarction (STEMI) status post percutaneous      coronary intervention (PCI) in January 2010.  6. Intramural left ventricular thrombus with apical dyskinesia.  7. Hypertension, currently stable.  8. Dyslipidemia.   PRIMARY CARE PHYSICIAN:  He does not have a primary care physician, and  he is followed by Dr. Peter Swaziland, Cardiologist, in [place].  1. Cardiology, Dr. Peter Swaziland.  2. Electrophysiology - Cardiology, Dr. Hillis Range, of [facility].  3. Infectious Diseases, Dr. Lina Sayre.   DISCHARGE MEDICATIONS:  1. Ancef 2 gm IV piggyback q.8 hourly until April 06, 2009.  2. Aspirin 325 mg p.o. daily.  3. Plavix 75 mg p.o. daily.  4. Crestor 20 mg p.o. daily.  5. Altace 10 mg p.o. daily.  6. Norvasc 10 mg p.o. daily.  7. Metoprolol 75 mg p.o. b.i.d.   HOSPITAL COURSE BY PROBLEM-WISE:  1. Staphylococcus aureus bacteremia status post ICD explantation.      Patient was admitted on February 21, 2009, for  complaints of a fever,      positive blood cultures, and thrombocytopenia.  The patient was      admitted at [facility] and was transferred over to [facility] for further Cardiology evaluation.  The patient was      found to have Staphylococcus aureus bacteremia and was on started      on Ancef for methicillin-sensitive Staph aureus.  An Infectious      Disease followup was been done and they have suggested a total of 6      weeks of IV antibiotics.  Currently, the patient had an      echocardiogram done on August 29th, which showed no evidence of any      valvular vegetations at this time.  However, there was an      intramural thrombus in the left ventricle with an apical akinesis,      which possibly happened after the MI.  Ejection fraction was 45%      with a mildly reduced systolic function - CHF.   Patient had a PICC  line placed after the second set of blood cultures  were negative after treatment was started so the patient is currently  getting IV antibiotics he will get until April 06, 2009, after which  Infectious Disease has suggested a TEE at that time and further ID  consult required if at that time to be called by Cardiology.   The patient had his ICD explanted as the ICD site also grew MSSA and the  infection spread from the external wound to the internal blood stream.  So since the patient had an ICD placed in view of the fact that he had a  cardiopulmonary arrest, he needs to get a LifeVest before he goes, and  after 12 weeks and no symptoms and completion of antibiotics, he will  have an ICD placed again by Cardiology at that time.  Currently, he is  in need of a LifeVest for that and awaiting LifeVest.  Will have first  set up by Cardiology.   1. Status post ICD explantation.   The patient had an ICD placement status post cardiopulmonary arrest.  He  need a life vest over the next 12 weeks before he can get the ICD placed  in again.   Cardiology is working on with the [employer] and thus  insurance is not paying for.  So in view of that fact, he is still  staying in the hospital because of awaiting LifeVest.   1. Status post ST-elevation MI and status post PCI - coronary artery      disease:   The patient had a cardiac cath done in January following a STEMI.  He  will be continued on Lopressor and aspirin and Plavix for that and will  continue followup with cardiologist   1. Intermedullary thrombus - Cardiology has initially stated about      verifying the echocardiogram with regards to anticoagulation.  So      far, Cardiology to recommend any further anticoagulation needed or      not.  If so, the patient can follow up with the cardiologist's      office for a further checkup with regards to that.   1. Hypertension, currently stable, and he has been started on Norvasc.      His blood pressure is stable.  We will continue on Norvasc and      metoprolol and Altace.  2. Dyslipidemia.  Continue statins, that is, Zocor.   DISPOSITION:  He will be discharged back home with Home Health care for  further IV antibiotics up to April 06, 2009.  There will be 3 times a  day off of IV antibiotics.   FOLLOWUP:  1. Follow up with Dr. Swaziland, Cardiology.  2. Follow up with Dr. Johney Frame 8 to 12 weeks after completion of all      antibiotics.   A total of 40 minutes spent on today's discharge.      Charlestine Massed, MD  Electronically Signed     UT/MEDQ  D:  02/28/2009  T:  02/28/2009  Job:  [id number]   cc:   [name], [name].  Hillis Range, MD  Fransisco Hertz, [name].

## 2011-01-08 NOTE — Cardiovascular Report (Signed)
NAME:  [name], [name] NO.:  [id number]   MEDICAL RECORD NO.:  [id number]          PATIENT TYPE:  INP   LOCATION:  2902                         FACILITY:  [facility]   PHYSICIAN:  [name], [name].  DATE OF BIRTH:  26-Jun-1956   DATE OF PROCEDURE:  DATE OF DISCHARGE:                            CARDIAC CATHETERIZATION   INDICATIONS FOR PROCEDURE:  A 55 year old white male who is studied  emergently post cardiac arrest.  The patient had presented 1 week prior  with an acute ST elevation myocardial infarction anteriorly.  He had  extensive stenting of the mid LAD at that time.  His post MI course was  complicated by some episodes of nonsustained-ventricular tachycardia  that subsequently resolved.  The patient had a witnessed cardiac arrest  at home today.  He was noted to be struggling and his respiration was  unresponsive.  CPR was initiated.  He was successfully defibrillated  with an AED and subsequently intubated and transferred to our facility.  His ECG still shows persistent ST elevation and the anterior precordial  leads with Q waves, this is similar to his ECG at the time of discharge.  It was unclear at this point whether this represented a subacute  thrombosis of his stent versus an arrhythmogenic arrest.  Access was via  the right femoral artery with the standard Seldinger technique.  The  patient was intubated.  He was unresponsive with decerebrate posturing.   EQUIPMENT:  A 6-French 4 cm right and left Judkins catheter, 6-French  pigtail catheter, 6-French arterial sheath, 6-French left Judkins 4  guide and 0.014 high-torque floppy wire, a 3.75 x 12 [place] Voyager balloon.   MEDICATIONS:  The patient received 5 mg of IV Lopressor x2.  He was  given Angiomax bolus at 0.75 mg/kg.  He was not started on the infusion.  The patient was hypertensive throughout its cath lab stay and had normal  rhythm throughout.  Contrast dose was 125 mL of Omnipaque.   HEMODYNAMIC DATA:  Aortic pressure was 203/121 with mean of 155, left  ventricle pressure was 197 with EDP of 36 mmHg.   ANGIOGRAPHIC DATA:  The left coronary artery arises and distributes  normally.  The left main coronary artery is short without significant  disease.  The left anterior descending artery is patent with TIMI grade  III flow distally.  There is a focal 50% stenosis in the mid segment of  the stent.  There is no thrombus noted.  The distal LAD has somewhat  diffuse 40-50% stenosis at the apex.   Left circumflex coronary has a 30% narrowing proximally followed by 50-  70% stenosis before the takeoff in the first obtuse marginal vessel.  First obtuse marginal vessel has 50-70% stenosis diffusely in the  midvessel.   The right coronary artery is a dominant vessel.  It has minor  irregularities throughout its course less than 10-20%.  In the PDA  midvessel, there is 50-60% stenosis.   Left ventricular angiography performed in the RAO view demonstrates  diffuse anterolateral and distal inferior wall akinesia with dyskinesia  of the apex.  Overall, ejection fraction was estimated at 30-35%.   We proceeded with angioplasty of the mid LAD stent.  While this was not  felt to be the cause of his arrest given the fact that he has TIMI grade  III flow.  There was a 50% focal stenosis within the stented segment  suggesting an area that was inadequately opposed.  There was an area of  calcification in this region.  We went ahead and angioplastied of this  with the 3.75 x 12-mm [place] Voyager balloon post dilating this up to 16  atm.  This yielded an excellent angiographic result with 0% residual  stenosis and again TIMI grade III flow.   FINAL ASSESSMENT:  1. Atherosclerotic coronary artery disease.  The patient has continued      patency of the stent in the mid LAD although a focal 50% narrowing      was successfully angioplastied.  There was moderate disease noted      in the left  circumflex and posterior descending arteries of the      right coronary artery.  2. Severe left ventricular dysfunction.   PLAN:  Based on these findings, I feel that the patient's cardiac arrest  was arrhythmogenic.  He will be continued on full hemodynamic and  ventilator support, and transferred to our coronary intensive care unit.  He will be considered for cooling protocol post arrest.           ______________________________  [name], [name].     PMJ/MEDQ  D:  09/02/2008  T:  09/02/2008  Job:  [id number]

## 2011-01-08 NOTE — Consult Note (Signed)
NAME:  [name], LOS NO.:  [id number]   MEDICAL RECORD NO.:  [id number]          PATIENT TYPE:  INP   LOCATION:  3704                         FACILITY:  [facility]   PHYSICIAN:  [name], [name].  DATE OF BIRTH:  1955/12/19   DATE OF CONSULTATION:  DATE OF DISCHARGE:                                 CONSULTATION   HISTORY OF PRESENT ILLNESS:  [name] is a 55 year old white male,  well known to me.  He has a history of coronary artery disease and  presented in January of this year with an acute anterior myocardial  infarction.  He underwent emergent stenting of the LAD at that time.  He  also had 50-70% stenosis in the proximal circumflex and obtuse marginal  vessels.  He had a 50-60% stenosis in the mid PDA.  The patient  initially did well post heart attack, but after being discharged home,  he returned with out of hospital cardiac arrest.  He was subsequently  loaded on amiodarone.  He had an ICD placed at that time.  He had  congestive heart failure which was treated.  Ejection fraction was 40%.  He has been on chronic aspirin and Plavix.  He has done very well from a  cardiac standpoint and actually in followup with [name] was noted  not to have any recurrent ventricular arrhythmias, and his amiodarone  was discontinued.  He was noted to have some mild subclinical  hypothyroidism on amiodarone.  The patient presents at this time with  acute febrile illness.  This began on Saturday morning with acute nausea  and vomiting.  He had fever up to 103.  He was seen in the emergency  department at [name] on Sunday and placed on Zithromax and  discharged to home.  He continued to do very poorly and was presented  back to the hospital on Monday and was readmitted.  Subsequent blood  cultures have been positive for staph aureus bacteremia.  The patient  was transferred to our facility for further evaluation.  He has  currently been having some left-sided  pleuritic type chest pain.  He has  had no anginal symptoms.  He has a cough.  His appetite has been poor.   PAST MEDICAL HISTORY:  1. Status post anterior myocardial infarction in January 2010.      Subsequent stenting of the mid LAD.  2. History of congestive heart failure with moderate left ventricular      dysfunction.  3. Hypertension.  4. Status post ICD implant for out of hospital cardiac arrest.  5. History of ventricular tachycardia, resolved.   CURRENT MEDICATIONS:  P.r.n. Tylenol.  He previously received Levaquin  and vancomycin for antibiotic coverage.  Metoprolol 5 mg q.6 h. IV,  Protonix 40 mg IV daily.  All other medications are on hold.   PRIOR MEDICATIONS:  1. Aspirin 325 mg per day.  2. Plavix 75 mg daily.  3. Pravastatin 40 mg daily.  4. Ramipril 10 mg daily.  5. Metoprolol 75 mg b.i.d.  6. Nitroglycerin p.r.n.  7. He is also on  furosemide once a day.   He has no known allergies.   SOCIAL HISTORY:  The patient is married.  He is currently disabled.  He  states he has not been smoking but other history demonstrates, he has  been smoking a few cigarettes a day, denies alcohol use.   FAMILY HISTORY:  Positive for coronary disease and stroke.   REVIEW OF SYSTEMS:  As noted in HPI.  All other systems were reviewed  and are negative.   PHYSICAL EXAMINATION:  GENERAL:  This is a middle-aged white male, who  appears ill.  VITAL SIGNS:  His temperature is 100.4, pulse is 90 and regular.  His  blood pressure is 162/80, sats 94% on room air.  HEENT:  He appears flushed.  His oropharynx is clear.  NECK:  Supple without JVD, adenopathy, thyromegaly, or bruits.  LUNGS:  Diffuse scattered rhonchi and wheezes.  CARDIAC:  Regular rate and rhythm without gallop, murmur, rub, or click.  His ICD site is healed well.  There is no evidence of erythema.  ABDOMEN:  Soft and nontender.  He has no masses or organomegaly.  EXTREMITIES:  Femoral and pedal pulses 2+ and symmetric.   He has no  edema.  NEUROLOGIC:  He is alert, oriented x3.  Cranial nerves II through XII  are intact.  His motor exam is grossly normal.   LABORATORY DATA:  Chest x-ray shows a subtle right upper lobe density.  Otherwise, no acute change.  Abdominal ultrasound showed dilated  intrahepatic ducts but was otherwise unremarkable.  ECG shows normal  sinus rhythm with evidence of old anterior infarction.  There were no  acute changes.  Blood cultures were positive x2 for staph aureus.  Lipase is 12.  Sodium 129, potassium 3.8, chloride 100, CO2 25, BUN 12,  creatinine 0.89, glucose 110.  Total bili is 1.8, SGOT is 56, albumin is  2.4.  White count is 8100, hemoglobin 12.6, hematocrit 35.7, platelets  dropped to 32,000.  They were 129,000 on admission.  CPK-MB and  troponins were negative.   IMPRESSION:  1. Acute febrile illness with staphylococcal bacteremia.  Need to rule      out endocarditis.  2. Marked thrombocytopenia.  Given acute drop in platelets, I think it      is very unlikely this is related to his Plavix or aspirin.  It may      be related to medication effects or effects of his acute infection.  3. Coronary artery disease status post prior anterior myocardial      infarction.  Status post stenting of the left anterior descending.  4. History of out of hospital cardiac arrest status post implantable      cardioverter-defibrillator implant.  5. Hypertension.   PLAN:  When the patient is able to resume p.o. medications, we would  resume his Altace and metoprolol.  We will hold his aspirin and Plavix  at this time until his platelet count is recovering and at which point  then I would resume it.  I would recommend a transesophageal echo to  rule out endocarditis, particularly given his ICD implant.  We will  tentatively schedule this for this coming Friday.  We will also follow  up on thyroid function studies during this admission.  We will follow  with you.            ______________________________  [name], [name].     PMJ/MEDQ  D:  02/22/2009  T:  02/23/2009  Job:  [id number]   cc:   Billy Fantasia, MD  Billy Range, MD

## 2011-01-08 NOTE — H&P (Signed)
NAME:  [name], [name] NO.:  [id number]   MEDICAL RECORD NO.:  [id number]          PATIENT TYPE:  INP   LOCATION:  2903                         FACILITY:  [facility]   PHYSICIAN:  [name], [name].  DATE OF BIRTH:  [birth year]   DATE OF ADMISSION:  08/26/2008  DATE OF DISCHARGE:                              HISTORY & PHYSICAL   HISTORY OF PRESENT ILLNESS:  Mr. Nichols is a 55 year old white male  without prior cardiac history, who at 1:30 today was chopping wood.  He  developed acute severe substernal chest pain radiating into his left  arm.  There was no relief with rest.  EMS was called and he was taken to  [facility].  ECG showed 4-5-mm ST elevation in leads V3-V5 with  2-mm ST elevation in leads II, III, aVF, and V6.  He was given IV  nitroglycerin, heparin, and oral aspirin without relief.  He was  transferred to our facility for emergent cardiac catheterization.  The  patient has no prior medical history, but has not seen a doctor in over  2 years.  He does have a history of tobacco abuse.  He describes having  a similar chest pain 2 nights ago that awoke him from sleep and lasted  approximately 45 minutes, but he felt that that was heartburn.   PAST MEDICAL HISTORY:  He has previous surgery on his ankle.  He has no  known history of diabetes, hypertension, or hypercholesterolemia.   ALLERGIES:  He has no known allergies.   MEDICATIONS:  He is on no medications.   SOCIAL HISTORY:  He is married with children.  He smokes 1 pack per day.  He has been smoking for 35 years.  He denies alcohol use.  He works as a  Curator.   FAMILY HISTORY:  Father has a history of coronary artery disease.  Mother died in her 24s with a stroke.  He has 2 sisters who are alive  and well.   REVIEW OF SYSTEMS:  He denies any history of stroke or TIA.  He has no  bleeding disorder.  He has had no recent edema, orthopnea, or PND.  He  denies any palpitations, dizziness,  or syncope.  He has had no recent  change in bowel or bladder habits.  No recent orthopedic issues.  All  other systems are reviewed and are negative.   PHYSICAL EXAMINATION:  GENERAL:  The patient is a middle age white male  in acute distress.  VITAL SIGNS:  His blood pressure is 190/110; pulse is 100 and irregular,  and respirations.  HEENT:  He is mildly flushed.  He is normocephalic and atraumatic.  His  pupils are equal, round, and reactive to light and accommodation.  Extraocular movements are full.  Oropharynx is clear.  NECK:  Without JVD, adenopathy, thyromegaly, or bruits.  LUNGS:  Clear.  CARDIAC:  A regular rate and rhythm without gallop, murmur, rub, or  click.  ABDOMEN:  Soft and nontender.  He has no masses or hepatosplenomegaly.  Femoral pedal pulses are 2+ and symmetric.  EXTREMITIES:  He has no edema or cyanosis.  NEUROLOGIC:  He is alert and oriented x4.  His cranial nerves II through  XII are intact.  He has no focal findings.   LABORATORY DATA:  ECG shows persistent severe ST elevation in anterior  leads with milder ST elevation in the inferior leads.   IMPRESSION:  1. Acute anterior ST elevation myocardial infarction.  2. Severe hypertension.  3. Tobacco abuse.   PLAN:  We will proceed with emergent cardiac catheterization and  potential intervention.  We will counsel the patient on smoking  cessation.  He will be treated aggressively for blood pressure control  including IV nitroglycerin and IV Lopressor.  Further recommendations  pending the results of his cardiac catheterization.           ______________________________  [name], [name].     PMJ/MEDQ  D:  08/26/2008  T:  08/26/2008  Job:  [id number]

## 2011-01-08 NOTE — Cardiovascular Report (Signed)
NAMEDORION, PETILLO NO.:  [id number]   MEDICAL RECORD NO.:  [id number]          PATIENT TYPE:  INP   LOCATION:  2903                         FACILITY:  [facility]   PHYSICIAN:  [name], [name].  DATE OF BIRTH:  December 06, 1955   DATE OF PROCEDURE:  08/26/2008  DATE OF DISCHARGE:                            CARDIAC CATHETERIZATION   PROCEDURES:  1. Left heart catheterization, coronary and left ventricular      angiography.  2. Intracoronary stenting of the mid-left anterior descending.   INDICATION FOR PROCEDURE:  This is a 55 year old white male with history  of tobacco abuse who presented to [facility] with acute onset  and sudden crushing chest pain while chopping wood.  ECG showed marked  ST elevation in the anterior leads with mild ST elevation in the  inferior leads.  The patient was transferred to our facility for acute  cardiac catheterization.  Access via the right femoral artery using  standard Seldinger technique.   EQUIPMENTS USED:  A 6-French 4-cm right and left Judkins catheter, 6-  French pigtail catheter, 6-French JL-4 guide and 0.014 Prowater wire, a  3.0- x 20-mm Apex balloon; a 3.5- x 28-mm Xience stent, a 3.5- x 12-mm  Xience stent, and a 3.5- x 15-mm Xience stent; a 3.75- x 20-mm Voyager  [place] balloon.   MEDICATIONS:  1. Lopressor 5 mg IV x3.  2. Fentanyl 25 mcg IV x2.  3. Nitroglycerin drip titrated up to 30 mcg per minute for blood      pressure and chest pain.  4. Angiomax bolus at 0.75 mg/kg IV followed by continuous fusion at      1.75 mg/kg/hr.  5. Plavix 600 mg p.o.  6. Pepcid 20 mg IV.  7. Potassium 20 mEq p.o.   ACT was 379 seconds.   CONTRAST:  225 mL of Omnipaque.   HEMODYNAMIC DATA:  At the time of ventriculography, his left ventricular  pressure was 138 with EDP of 15 mmHg, aortic pressure is 137/94 with a  mean of 113.   ANGIOGRAPHIC DATA:  The left coronary artery arises and distributes  normally.  The left  main coronary is normal with minimal wall  irregularities, less than 10%.   Left anterior descending artery was a large vessel.  It gave rise to a  moderate-sized diagonal branch and that was occluded in the midvessel.  There was TIMI grade 0 flow.   The left circumflex coronary artery was moderate-sized vessel.  Has a  70% segmental stenosis in the proximal vessel.  The first marginal  vessel has a 70% stenosis in the mid vessel, which is very small in  caliber at this point.  The second marginal vessel and distal circumflex  have diffuse irregularities less than 20%.   The right coronary artery has diffuse irregularities up to 20-30% in the  proximal mid vessel.  The PDA is diffusely diseased with more focal 60-  70% stenosis in the midvessel.  This is a small branch.   At this point, we proceeded with emergent intervention of the LAD.  Using the above equipment  and anticoagulation, we were able to cross the  lesion easily with the wire.  We pre-dilated the lesion using the 3.0- x  20-mm Apex dilating to 6 and 8 atmospheres.  There was a long segment of  disease.  We next stented the more distal segment using a 3.5- x 28-mm  Xience stent, deployed this at 9 atmospheres and then 12 atmospheres  with the stent balloon.  There appeared to be a segment more proximally  that was not covered and we attempted to deploy a second stent in this  region using a 3.5- x 12-mm Xience stent.  We covered this area on  subsequent review.  We placed this within the previous stent at least  partially and did not adequately cover the proximal portion of the  stent.  We therefore placed a third stent, a 3.5- x 15-mm Xience stent  to cover the proximal portion of the lesion.  This stent was also  deployed at 9 atmospheres with a stent balloon.  The entire stented  segment was post-dilated with a 3.7- x 20-mm Voyager [place] balloon dilating  up to 14 atmospheres in the more distal segment and 16 atmospheres  more  proximally.  This yielded an excellent angiographic result with 0%  residual stenosis and TIMI grade 3 flow.  At this point, the patient was  pain free.  His blood pressure had improved significantly.  He was  severely hypertensive on initial presentation.  His blood pressure  returned to normal.  He did have reperfusion arrhythmias, included  accelerating ventricular rhythms, PVCs, and some nonsustained  ventricular tachycardia, but he was asymptomatic.   At this point, we performed left ventricular angiography.  This  demonstrated akinesia of the distal anterior and inferior wall segments.  The apex was dyskinetic.  Overall, ejection fraction was estimated at  45%.   FINAL INTERPRETATION:  1. Three-vessel atherosclerotic coronary artery disease.  The patient      has occlusion of the mid LAD, which is his culprit lesion.  He has      moderate disease in the proximal circumflex and PDA.  2. Moderate left ventricular dysfunction.  3. Successful intracoronary stenting of the mid-LAD.   PLAN:  Continue aggressive medical therapy with aspirin and Plavix,  statin therapy, beta-blocker, and ACE inhibitor.  Need smoking cessation  counseling.           ______________________________  [name], [name].     PMJ/MEDQ  D:  08/26/2008  T:  08/26/2008  Job:  [id number]

## 2011-03-01 ENCOUNTER — Telehealth: Payer: Self-pay | Admitting: Cardiology

## 2011-03-01 NOTE — Telephone Encounter (Signed)
Recvd call from patient wife.  Wanted to schedule an appointment with Dr. Swaziland, but is also seeing Dr. Johney Frame.  Advised we are in the same practice.  Does patient need to see Dr. Swaziland or continue to see Dr. Johney Frame?  Advised call back on Monday.

## 2011-03-04 NOTE — Telephone Encounter (Signed)
Wife called wanting to know if he still needed to see Dr. Swaziland. Has an app w/ Dr. Johney Frame. Advised that Dr. Johney Frame only checked ICD and we were primary cardiologist. Made her an app for August.

## 2011-03-21 ENCOUNTER — Encounter: Payer: Self-pay | Admitting: Internal Medicine

## 2011-03-25 ENCOUNTER — Encounter: Payer: Self-pay | Admitting: Internal Medicine

## 2011-03-25 ENCOUNTER — Ambulatory Visit (INDEPENDENT_AMBULATORY_CARE_PROVIDER_SITE_OTHER): Payer: Medicare Other | Admitting: Internal Medicine

## 2011-03-25 DIAGNOSIS — E7800 Pure hypercholesterolemia, unspecified: Secondary | ICD-10-CM

## 2011-03-25 DIAGNOSIS — I10 Essential (primary) hypertension: Secondary | ICD-10-CM

## 2011-03-25 DIAGNOSIS — I469 Cardiac arrest, cause unspecified: Secondary | ICD-10-CM

## 2011-03-25 DIAGNOSIS — I472 Ventricular tachycardia: Secondary | ICD-10-CM

## 2011-03-25 DIAGNOSIS — F17200 Nicotine dependence, unspecified, uncomplicated: Secondary | ICD-10-CM

## 2011-03-25 DIAGNOSIS — I2510 Atherosclerotic heart disease of native coronary artery without angina pectoris: Secondary | ICD-10-CM

## 2011-03-25 MED ORDER — METOPROLOL TARTRATE 50 MG PO TABS: ORAL_TABLET | ORAL | Status: DC

## 2011-03-25 MED ORDER — CLOPIDOGREL BISULFATE 75 MG PO TABS: 75.0000 mg | ORAL_TABLET | Freq: Every day | ORAL | Status: DC

## 2011-03-25 MED ORDER — RAMIPRIL 10 MG PO CAPS: 10.0000 mg | ORAL_CAPSULE | Freq: Every day | ORAL | Status: DC

## 2011-03-25 MED ORDER — ROSUVASTATIN CALCIUM 20 MG PO TABS: 20.0000 mg | ORAL_TABLET | Freq: Every day | ORAL | Status: DC

## 2011-03-25 NOTE — Assessment & Plan Note (Signed)
Complaint with medicine is advised We will restart home medicines today He will need BMET upon follow-up with [name]

## 2011-03-25 NOTE — Assessment & Plan Note (Signed)
He has been noncompliant with medicines We will restart ASA, plavix, beta blocker, ace inhibitor and crestor today He will follow-up with [name] next week.

## 2011-03-25 NOTE — Assessment & Plan Note (Signed)
Restart crestor Fasting lipids and lfts per [name]

## 2011-03-25 NOTE — Assessment & Plan Note (Signed)
Cessation advised. 

## 2011-03-25 NOTE — Progress Notes (Signed)
The patient presents today for routine electrophysiology followup.  Since last being seen in our clinic, the patient reports doing very well.  He remains active without ischemic difficulty.  Unfortunately, he has been noncompliant with device checks, medications, or office follow-up.  He has also gained more than 10 pounds.  He continues to smoke. Today, he denies symptoms of palpitations, chest pain, shortness of breath, orthopnea, PND, lower extremity edema, dizziness, presyncope, syncope, or neurologic sequela.  The patient feels that he is tolerating medications without difficulties and is otherwise without complaint today.   Past Medical History  Diagnosis Date  . CAD (coronary artery disease)   . Ischemic cardiomyopathy   . Polymorphic ventricular tachycardia     with VF arrest, s/p subsequent ICD  . Ejection fraction < 50%     30-35%  . HL (hearing loss)   . CHF NYHA class II   . Adrenal mass     currently being evaluated   Past Surgical History  Procedure Date  . Ankle surgery   . Icd implant 7./10    with subsequent extraction for MSSA bacteremia  . Icd reimplantation 05/09/09    Current Outpatient Prescriptions  Medication Sig Dispense Refill  . aspirin EC 325 MG tablet Take 325 mg by mouth daily.        . clopidogrel (PLAVIX) 75 MG tablet Take 75 mg by mouth daily.        . metoprolol (LOPRESSOR) 50 MG tablet Take by mouth 2 (two) times daily. 1.5 tabs       . Niacin, Antihyperlipidemic, (NIASPAN PO) Take by mouth daily. Pt. Is taking samples       . ramipril (ALTACE) 10 MG capsule Take 10 mg by mouth daily.        . rosuvastatin (CRESTOR) 20 MG tablet Take 20 mg by mouth daily.          No Known Allergies  History   Social History  . Marital Status: Married    Spouse Name: N/A    Number of Children: N/A  . Years of Education: N/A   Occupational History  . Not on file.   Social History Main Topics  . Smoking status: Current Some Day Smoker  . Smokeless  tobacco: Not on file   Comment: 1 pack per week. Has been smoking for 35 years.   . Alcohol Use: No  . Drug Use: No  . Sexually Active: Not on file   Other Topics Concern  . Not on file   Social History Narrative   Works as a Curator.      Family History  Problem Relation Age of Onset  . Stroke Mother   . Coronary artery disease Other     ROS-  All systems are reviewed and are negative except as outlined in the HPI above  Physical Exam: Filed Vitals:   03/25/11 0945  BP: 152/90  Pulse: 79  Height: 5\' 7"  (1.702 m)  Weight: 197 lb 12.8 oz (89.721 kg)    GEN- The patient is well appearing, alert and oriented x 3 today.   Head- normocephalic, atraumatic Eyes-  Sclera clear, conjunctiva pink Ears- hearing intact Oropharynx- clear Neck- supple, no JVP Lymph- no cervical lymphadenopathy Lungs- Clear to ausculation bilaterally, normal work of breathing Heart- Regular rate and rhythm, no murmurs, rubs or gallops, PMI not laterally displaced GI- soft, NT, ND, + BS Extremities- no clubbing, cyanosis, or edema MS- no significant deformity or atrophy Skin- no rash or  lesion Psych- euthymic mood, full affect Neuro- strength and sensation are intact ICD pocket is well healed  Assessment and Plan:

## 2011-03-25 NOTE — Patient Instructions (Signed)
Your physician recommends that you schedule a follow-up appointment in: 3 months in the device clinic and 12 months with Dr. Allred  

## 2011-03-25 NOTE — Assessment & Plan Note (Signed)
S/p VF arrest with subsequent ICD implant  Normal ICD function See Arita Miss Art report

## 2011-04-01 ENCOUNTER — Encounter: Payer: Self-pay | Admitting: *Deleted

## 2011-04-04 ENCOUNTER — Ambulatory Visit (INDEPENDENT_AMBULATORY_CARE_PROVIDER_SITE_OTHER): Payer: Medicare Other | Admitting: Cardiology

## 2011-04-04 ENCOUNTER — Encounter: Payer: Self-pay | Admitting: Cardiology

## 2011-04-04 VITALS — BP 128/82 | HR 62 | Ht 67.0 in | Wt 197.0 lb

## 2011-04-04 DIAGNOSIS — I469 Cardiac arrest, cause unspecified: Secondary | ICD-10-CM

## 2011-04-04 DIAGNOSIS — E279 Disorder of adrenal gland, unspecified: Secondary | ICD-10-CM

## 2011-04-04 DIAGNOSIS — I509 Heart failure, unspecified: Secondary | ICD-10-CM

## 2011-04-04 DIAGNOSIS — E278 Other specified disorders of adrenal gland: Secondary | ICD-10-CM | POA: Insufficient documentation

## 2011-04-04 DIAGNOSIS — E7800 Pure hypercholesterolemia, unspecified: Secondary | ICD-10-CM

## 2011-04-04 DIAGNOSIS — I2510 Atherosclerotic heart disease of native coronary artery without angina pectoris: Secondary | ICD-10-CM

## 2011-04-04 DIAGNOSIS — F17200 Nicotine dependence, unspecified, uncomplicated: Secondary | ICD-10-CM

## 2011-04-04 LAB — CBC WITH DIFFERENTIAL/PLATELET
Basophils Absolute: 0.1 10*3/uL (ref 0.0–0.1)
HCT: 48.1 % (ref 39.0–52.0)
Hemoglobin: 16.2 g/dL (ref 13.0–17.0)
Lymphs Abs: 2.7 10*3/uL (ref 0.7–4.0)
MCHC: 33.6 g/dL (ref 30.0–36.0)
MCV: 97.1 fl (ref 78.0–100.0)
Monocytes Absolute: 0.9 10*3/uL (ref 0.1–1.0)
Neutro Abs: 6 10*3/uL (ref 1.4–7.7)
Platelets: 308 10*3/uL (ref 150.0–400.0)
RDW: 13.5 % (ref 11.5–14.6)

## 2011-04-04 LAB — HEPATIC FUNCTION PANEL
AST: 19 U/L (ref 0–37)
Albumin: 4.3 g/dL (ref 3.5–5.2)
Alkaline Phosphatase: 113 U/L (ref 39–117)
Bilirubin, Direct: 0 mg/dL (ref 0.0–0.3)
Total Bilirubin: 0.6 mg/dL (ref 0.3–1.2)

## 2011-04-04 LAB — LIPID PANEL
HDL: 34.1 mg/dL — ABNORMAL LOW (ref >39.00)
Total CHOL/HDL Ratio: 7

## 2011-04-04 LAB — BASIC METABOLIC PANEL
Calcium: 9.2 mg/dL (ref 8.4–10.5)
GFR: 85.9 mL/min (ref >60.00)
Glucose, Bld: 90 mg/dL (ref 70–99)
Potassium: 5.1 mEq/L (ref 3.5–5.1)
Sodium: 138 mEq/L (ref 135–145)

## 2011-04-04 LAB — LDL CHOLESTEROL, DIRECT: Direct LDL: 217.3 mg/dL

## 2011-04-04 MED ORDER — ATORVASTATIN CALCIUM 40 MG PO TABS: 40.0000 mg | ORAL_TABLET | Freq: Every day | ORAL | Status: DC

## 2011-04-04 NOTE — Assessment & Plan Note (Addendum)
Status post anterior myocardial infarction in January 2010 and treated with extensive stenting of the LAD. He is asymptomatic at this point. Since he is more than one year out from his infarct and has been off of Plavix for the past 5 months I recommended continuing with aspirin only. We will schedule him for a stress Myoview study.

## 2011-04-04 NOTE — Assessment & Plan Note (Signed)
Out-of-hospital cardiac arrest with ventricular tachycardia/fibrillation one-week status post anterior myocardial infarction. Now status post ICD implant.

## 2011-04-04 NOTE — Progress Notes (Signed)
[name] Date of Birth: 1955/11/22   History of Present Illness: [name] is seen today for followup. He was last seen in [name] of 2010. He has a complex cardiac history. He presented with an acute anterior myocardial infarction in January 2010 and underwent emergent stenting of the LAD. This was stented with a 3.5 x 28 mm, a 3.5 x 15 mm, and a 3.5 x 12 mm Xience stents. One week later he had an out of hospital cardiac arrest and was successfully defibrillated. Repeat cardiac catheterization demonstrated a 50% stenosis in the mid stented segment. He underwent angioplasty of that lesion and subsequently had an ICD placed. In July of 2010 he had an episode of pericarditis and staph bacteremia. His ICD was removed and he had it replaced in September of 2010. During that time he was found to have a 4.1 cm right adrenal mass as an incidental finding on CT scan. Largely because of insurance issues he has been lost to followup. On return today he reports that he is feeling well. He denies any chest pain. He stays active with his work and walks a lot. He did have his ICD checked recently and it was functioning well. He continues to smoke one half pack per day. He is taking aspirin, metoprolol, and an ACE inhibitor. He is no longer taking a statin. He is now on Medicare.  Current Outpatient Prescriptions on File Prior to Visit  Medication Sig Dispense Refill  . metoprolol (LOPRESSOR) 50 MG tablet 1.5 tabs twice daily  90 tablet  3  . ramipril (ALTACE) 10 MG capsule Take 1 capsule (10 mg total) by mouth daily.  30 capsule  3  . aspirin EC 325 MG tablet Take 325 mg by mouth daily.          No Known Allergies  Past Medical History  Diagnosis Date  . CAD (coronary artery disease)     Est. EF of 45% -- Successful intracoronary stenting of the mid-LAD -- Three-vessel atherosclerotic coronary artery disease.  The patient has occlusion of the mid LAD, which is his culprit lesion.  He has  moderate  disease in the proximal circumflex and PDA -- Moderate left ventricular dysfunction -- [name], [name]  . Ischemic cardiomyopathy     severe ischemic cardiomyopathy -- ejection fraction 30-35%  . Polymorphic ventricular tachycardia     with VF arrest, s/p subsequent ICD  . Ejection fraction < 50%     30-35%  . HL (hearing loss)   . CHF NYHA class II      New York Heart Association class II/III heart failure  . Adrenal mass     currently being evaluated -- 4.1 cm right adrenal mass  . Acute anterior myocardial infarction 09/02/2008    post emergent stenting of the LAD in January 2010 -- anterior ST-elevation myocardial infarction  . Hyperlipidemia   . Hypertension   . LV dysfunction     known severe LV dysfunction/severe ischemic cardiomyopathy  . Cardiac arrest - ventricular fibrillation 09/02/2008    Out of hospital cardiac arrest secondary to arrhythmia  . History of methicillin resistant staphylococcus aureus (MRSA)     History of methicillin-sensitive Staphylococcus aureus bacteremia  with implantable cardioverter-defibrillator explant, February 23, 2009  . CHF (congestive heart failure)   . Pericarditis     history of acute pericarditis in July 2010 now resolved  . Anemia     Past Surgical History  Procedure Date  . Ankle  surgery   . Icd implant 02/23/2009     with subsequent extraction for MSSA bacteremia  . Icd reimplantation 05/09/2009    status post implant of a Medtronic Maximo II DR dual- chamber cardioverter defibrillator by Dr. Hillis Range  . Cardiac catheterization 08/26/2008    Est. EF of 45% -- Successful intracoronary stenting of the mid-LAD -- Three-vessel atherosclerotic coronary artery disease.  The patient has occlusion of the mid LAD, which is his culprit lesion.  He has  moderate disease in the proximal circumflex and PDA -- Moderate left ventricular dysfunction -- [name], [name]  . Cardiac catheterization 03/16/2009     EF was approximately 30% --  Atherosclerotic coronary vascular disease, three-vessel --  Moderate-to-severe ischemic cardiomyopathy -- No clear evidence for an acute culprit lesion that would explain the patient's chest pain -- [name], [name].     History  Smoking status  . Current Some Day Smoker -- 37 years  . Types: Cigarettes  Smokeless tobacco  . Not on file  Comment: 1 pack per week. Has been smoking for 35 years.     History  Alcohol Use No    Family History  Problem Relation Age of Onset  . Stroke Mother   . Coronary artery disease Father     Review of Systems: As noted in history of present illness. All other systems were reviewed and are negative.  Physical Exam: BP 128/82  Pulse 62  Ht 5\' 7"  (1.702 m)  Wt 197 lb (89.359 kg)  BMI 30.85 kg/m2 He is a pleasant white male in no acute distress. He is normocephalic, atraumatic. His pupils are equal round and reactive to light and accommodation. Sclera clear. Oropharynx is clear. Neck is supple no JVD, adenopathy, thyromegaly, or bruits. Lungs are clear. Cardiac exam reveals a regular rate and rhythm with a soft 1/6 systolic ejection murmur at the right upper sternal border. He has no rub. Chest CT site looks good. Abdomen is soft and nontender without masses or bruits. He has no edema. Pedal pulses are good. He is alert and oriented x3. Cranial nerves II through XII are intact. LABORATORY DATA: ECG demonstrates an atrially paced rhythm. There is evidence of the old anterior infarction with poor R waves in V1 through V4.  Assessment / Plan:

## 2011-04-04 NOTE — Assessment & Plan Note (Signed)
History of 4.1 cm right adrenal mass. We will refer him to endocrinology with Dr. Evlyn Kanner for further evaluation.

## 2011-04-04 NOTE — Assessment & Plan Note (Signed)
I recommended smoking cessation. He is going to try to quit cold [place] with nicotine replacement. I don't think he can afford Chantix at this point.

## 2011-04-04 NOTE — Patient Instructions (Addendum)
We will check fasting lab work today.  We will start you on lipitor 40 mg daily.  You need to quit smoking.  We will arrange follow up with Dr. Evlyn Kanner to evaluate your adrenal mass.  We will schedule you for a nuclear stress test.

## 2011-04-04 NOTE — Assessment & Plan Note (Signed)
We will check fasting lab work today. Clearly he needs to be on statin therapy we'll start him on Lipitor 40 mg daily.

## 2011-04-04 NOTE — Assessment & Plan Note (Signed)
Last echocardiogram in July of 2010 showed an ejection fraction of 40-45%. He appears to be euvolemic today. He will continue with ACE inhibitor and beta blocker therapy.

## 2011-04-05 ENCOUNTER — Telehealth: Payer: Self-pay | Admitting: *Deleted

## 2011-04-05 ENCOUNTER — Encounter: Payer: Self-pay | Admitting: Cardiology

## 2011-04-05 DIAGNOSIS — I2510 Atherosclerotic heart disease of native coronary artery without angina pectoris: Secondary | ICD-10-CM

## 2011-04-05 NOTE — Telephone Encounter (Signed)
Message copied by Lorayne Bender on Fri Apr 05, 2011  9:44 AM ------      Message from: [place], PETER M      Created: Thu Apr 04, 2011  5:10 PM       Chemistries and CBC are normal. Cholesterol is quite high. We started on Lipitor 40 mg daily. Need to check hfp and lipid panel in 3 months.

## 2011-04-05 NOTE — Telephone Encounter (Signed)
Notified of lab results. Will recheck labs in 3 mo. Will send results to Dr. Daiva Eves

## 2011-04-05 NOTE — Progress Notes (Signed)
Lm

## 2011-04-11 ENCOUNTER — Ambulatory Visit (HOSPITAL_COMMUNITY): Payer: Medicare Other | Attending: Cardiology | Admitting: Radiology

## 2011-04-11 VITALS — Ht 67.0 in | Wt 194.0 lb

## 2011-04-11 DIAGNOSIS — I479 Paroxysmal tachycardia, unspecified: Secondary | ICD-10-CM

## 2011-04-11 DIAGNOSIS — I2510 Atherosclerotic heart disease of native coronary artery without angina pectoris: Secondary | ICD-10-CM | POA: Insufficient documentation

## 2011-04-11 MED ORDER — TECHNETIUM TC 99M TETROFOSMIN IV KIT
33.0000 | PACK | Freq: Once | INTRAVENOUS | Status: AC | PRN
  Administered 2011-04-11: 33 via INTRAVENOUS

## 2011-04-11 MED ORDER — REGADENOSON 0.4 MG/5ML IV SOLN
0.4000 mg | Freq: Once | INTRAVENOUS | Status: AC
  Administered 2011-04-11: 0.4 mg via INTRAVENOUS

## 2011-04-11 MED ORDER — TECHNETIUM TC 99M TETROFOSMIN IV KIT
11.0000 | PACK | Freq: Once | INTRAVENOUS | Status: AC | PRN
  Administered 2011-04-11: 11 via INTRAVENOUS

## 2011-04-11 NOTE — Progress Notes (Signed)
[facility] SITE 3 NUCLEAR MED [address] [place] [address] [phone]  Cardiology Nuclear Med Study  [name] is a 55 y.o. male [id number] 1955-10-03   Nuclear Med Background Indication for Stress Test:  Evaluation for Ischemia and Stent Patency History: 09/02/2008 Angioplasty, 2010 Defibrillator, 02/2009 Echo: EF 45-50%, 02/2009 Heart Catheterization: EF 30% Multi vessel DZ, 08/26/2008, 08/26/2008 Myocardial Infarction: LAD and 08/26/2008 Stents: LAD x 3 Cardiac Risk Factors: Family History - CAD, Hypertension, Lipids and Smoker  Symptoms:  Rapid HR   Nuclear Pre-Procedure Caffeine/Decaff Intake:  None NPO After: 8:00pm   Lungs:  clear IV 0.9% NS with Angio Cath:  20g  IV Site: R Antecubital  IV Started by:  Bonnita Levan, RN  Chest Size (in):  46 Cup Size: n/a  Height: 5\' 7"  (1.702 m)  Weight:  194 lb (87.998 kg)  BMI:  Body mass index is 30.38 kg/(m^2). Tech Comments:  Patient held Metoprolol    Nuclear Med Study 1 or 2 day study: 1 day  Stress Test Type:  Treadmill/Lexiscan  Reading MD: Kristeen Miss, MD  Order Authorizing Provider:  P.Jordan  Resting Radionuclide: Technetium 5m Tetrofosmin  Resting Radionuclide Dose: 11 mCi   Stress Radionuclide:  Technetium 26m Tetrofosmin  Stress Radionuclide Dose: 33 mCi           Stress Protocol Rest HR: 63 Stress HR: 113  Rest BP: 157/78 Stress BP: 203/78  Exercise Time (min): 6:52 METS: 6.90   Predicted Max HR: 167 bpm % Max HR: 67.66 bpm Rate Pressure Product: 21308   Dose of Adenosine (mg):  n/a Dose of Lexiscan: 0.4 mg  Dose of Atropine (mg): n/a Dose of Dobutamine: n/a mcg/kg/min (at max HR)  Stress Test Technologist: Milana Na, EMT-P  Nuclear Technologist:  Domenic Polite, CNMT     Rest Procedure:  Myocardial perfusion imaging was performed at rest 45 minutes following the intravenous administration of Technetium 84m Tetrofosmin. Rest ECG: Sinus Bradycardia  Stress  Procedure:  The patient received IV Lexiscan 0.4 mg over 15-seconds with concurrent low level exercise and then Technetium 76m Tetrofosmin was injected at 30-seconds while the patient continued walking one more minute.  There were no significant changes, sob, and occ pvcs with Lexiscan.  Quantitative spect images were obtained after a 45-minute delay. Stress ECG: No significant change from baseline ECG  QPS Raw Data Images:  Normal; no motion artifact; normal heart/lung ratio. Stress Images:  There are large, severe defects in the apex, inferior wall and a mild defect of the septum. Rest Images:  There is a large severe defect in the apex and a medium defect of the inferior wall. Subtraction (SDS):  There is evidence of ischemia of the inferior wall and a previous anterior apical MI Transient Ischemic Dilatation (Normal <1.22): 1.07 Lung/Heart Ratio (Normal <0.45):  .37  Quantitative Gated Spect Images QGS EDV:  219 ml QGS ESV:  157 ml QGS cine images:  The LV is markedly enlarged.  There is akineis of the apex and inferior walls. QGS EF: 28%  Impression Exercise Capacity:  Lexiscan with low level exercise. BP Response:  Normal blood pressure response. Clinical Symptoms:  No chest pain. ECG Impression:  No significant ST segment change suggestive of ischemia. Comparison with Prior Nuclear Study: No images to compare  Overall Impression:  High risk stress nuclear study.  There is evidence of a previous apical MI and an inferior MI with peri-infarct ischemia.  The LV function is markedly  depressed.   Vesta Mixer, Montez Hageman., MD, [facility]

## 2011-04-12 NOTE — Progress Notes (Signed)
Nuclear report to Dr. [place]. Synetta Fail, his Nurse, was also contacted by phone. Stiven Kaspar, Farris Has

## 2011-04-14 NOTE — Progress Notes (Signed)
Will need to review images. Large anterior infarct not surprising given prior history. EF is still low. If significant inferior ischemia may need cath but I would like to review. Theron Arista [place]

## 2011-04-17 NOTE — Progress Notes (Signed)
Notified of stress test. Per Dr. [place] needs to have cath. Gave him an app for 9/4 with poss cath 9/6. He feels great. No CP or SOB. App day OK per Dr. [place]

## 2011-04-27 DIAGNOSIS — Z951 Presence of aortocoronary bypass graft: Secondary | ICD-10-CM

## 2011-04-27 HISTORY — DX: Presence of aortocoronary bypass graft: Z95.1

## 2011-04-30 ENCOUNTER — Encounter: Payer: Self-pay | Admitting: Cardiology

## 2011-04-30 ENCOUNTER — Ambulatory Visit
Admission: RE | Admit: 2011-04-30 | Discharge: 2011-04-30 | Disposition: A | Discharge status: Home / Self Care | Payer: Medicare Other | Source: Ambulatory Visit | Attending: Cardiology | Admitting: Cardiology

## 2011-04-30 ENCOUNTER — Ambulatory Visit (INDEPENDENT_AMBULATORY_CARE_PROVIDER_SITE_OTHER): Payer: Medicare Other | Admitting: Cardiology

## 2011-04-30 DIAGNOSIS — R079 Chest pain, unspecified: Secondary | ICD-10-CM

## 2011-04-30 DIAGNOSIS — I2510 Atherosclerotic heart disease of native coronary artery without angina pectoris: Secondary | ICD-10-CM

## 2011-04-30 DIAGNOSIS — I509 Heart failure, unspecified: Secondary | ICD-10-CM

## 2011-04-30 DIAGNOSIS — E279 Disorder of adrenal gland, unspecified: Secondary | ICD-10-CM

## 2011-04-30 DIAGNOSIS — F17200 Nicotine dependence, unspecified, uncomplicated: Secondary | ICD-10-CM

## 2011-04-30 DIAGNOSIS — E278 Other specified disorders of adrenal gland: Secondary | ICD-10-CM

## 2011-04-30 NOTE — Assessment & Plan Note (Signed)
Patient has a scheduled appointment to see Dr. Evlyn Kanner on September 19 for evaluation of this adrenal mass.

## 2011-04-30 NOTE — Assessment & Plan Note (Signed)
Again counseled patient on smoking cessation. He states he is going to quit. He has been unable to afford Chantix.

## 2011-04-30 NOTE — Patient Instructions (Addendum)
We will schedule you for a cardiac catheterization with possible intervention.  We will get blood work and CXR today.  Stop smoking.  Continue your current medications.    Angiography Angiography is a procedure used to look at the blood vessels (arteries) which carry the blood to different parts of your body. In this procedure a dye is injected through a catheter (a long, hollow tube about the size of a piece of cooked spaghetti) into an artery and x-rays are taken. The x-rays will show if there is a blockage or problem in a blood vessel.  PREPARATION FOR THE PROCEDURE  Let your caregiver know if you have had an allergy to dyes used in x-ray if you have ever had kidney problems or failure.   Do not eat or drink starting from midnight up to the time of the procedure, or as directed.   You may drink enough water to take your medications the morning of the procedure if you were instructed to do so.   You should be at the hospital or outpatient facility where the procedure is to be done 9/6 prior to the procedure or as directed.  PROCEDURE: 1. You may be given a medication to help you relax before and during the procedure through an IV in your hand or arm.  2. A local anesthetic to make the area numb may be used before inserting the catheter.  3. You will be prepared for the procedure by washing and shaving the area where the catheter will be inserted. This is usually done in the groin but may be done in the fold of your arm by your elbow.  4. A specially trained doctor will insert the catheter with a guide wire into an artery. This is guided under a special type of x-ray (fluoroscopy) to the blood vessel being examined.  5. Special dye is then injected and x-rays are taken. These will show where any narrowing or blockages are located.  AFTER THE PROCEDURE  After the procedure you will be kept in bed for several hours.   The access site will be watched and you will be checked frequently.     Blood tests, other x-rays and an EKG may be done.   You may stay in the hospital overnight for observation.  SEEK IMMEDIATE MEDICAL CARE IF:  You develop chest pain, shortness of breath, feel faint, or pass out.   There is bleeding, swelling, or drainage from the catheter insertion site.   You develop pain, discoloration, coldness, or severe bruising in the leg or arm, or area where the catheter was inserted.   An oral temperature above 101 develops.  Document Released: 05/22/2005 Document Re-Released: 12/12/2007 [facility] Patient Information 2011 [place], [place].

## 2011-04-30 NOTE — Assessment & Plan Note (Signed)
Ejection fraction is 28%. He is well compensated on his current medical therapy.

## 2011-04-30 NOTE — Assessment & Plan Note (Signed)
His nuclear stress test indicates evidence of his prior anterior infarction. I'm concerned that also shows significant inferior wall ischemia. I have recommended a repeat cardiac catheterization and this will be  performed this coming Thursday.

## 2011-04-30 NOTE — Progress Notes (Signed)
[name] Date of Birth: October 20, 1955   History of Present Illness: [name] is seen today for followup. He was last seen in December of 2010. He has a complex cardiac history. He presented with an acute anterior myocardial infarction in January 2010 and underwent emergent stenting of the LAD. This was stented with a 3.5 x 28 mm, a 3.5 x 15 mm, and a 3.5 x 12 mm Xience stents. One week later he had an out of hospital cardiac arrest and was successfully defibrillated. Repeat cardiac catheterization demonstrated a 50% stenosis in the mid stented segment. He underwent angioplasty of that lesion and subsequently had an ICD placed. In July of 2010 he had an episode of pericarditis and staph bacteremia. His ICD was removed and he had it replaced in September of 2010. During that time he was found to have a 4.1 cm right adrenal mass as an incidental finding on CT scan. Largely because of insurance issues he had been lost to followup. On return today he reports that he is feeling well. He denies any chest pain. He stays active with his work and walks a lot. He did have his ICD checked recently and it was functioning well. He continues to smoke one half pack per day. He is now on Medicare. Recent Myoview study demonstrates a large anterior infarct. There is also a significant area of inferior wall ischemia. Ejection fraction was 28%. On prior cardiac catheterization 2 years ago he was noted to have moderate disease in his left circumflex, obtuse marginal vessel, and PDA.  Current Outpatient Prescriptions on File Prior to Visit  Medication Sig Dispense Refill  . aspirin EC 325 MG tablet Take 325 mg by mouth daily.        Marland Kitchen atorvastatin (LIPITOR) 40 MG tablet Take 1 tablet (40 mg total) by mouth daily.  30 tablet  11  . metoprolol (LOPRESSOR) 50 MG tablet 1.5 tabs twice daily  90 tablet  3  . ramipril (ALTACE) 10 MG capsule Take 1 capsule (10 mg total) by mouth daily.  30 capsule  3    No Known  Allergies  Past Medical History  Diagnosis Date  . CAD (coronary artery disease)     Est. EF of 45% -- Successful intracoronary stenting of the mid-LAD -- Three-vessel atherosclerotic coronary artery disease.  The patient has occlusion of the mid LAD, which is his culprit lesion.  He has  moderate disease in the proximal circumflex and PDA -- Moderate left ventricular dysfunction -- Junia Nygren M. [place], [name]  . Ischemic cardiomyopathy     severe ischemic cardiomyopathy -- ejection fraction 30-35%  . Polymorphic ventricular tachycardia     with VF arrest, s/p subsequent ICD  . Ejection fraction < 50%     30-35%  . HL (hearing loss)   . CHF NYHA class II      New York Heart Association class II/III heart failure  . Adrenal mass     currently being evaluated -- 4.1 cm right adrenal mass  . Acute anterior myocardial infarction 09/02/2008    post emergent stenting of the LAD in January 2010 -- anterior ST-elevation myocardial infarction  . Hyperlipidemia   . Hypertension   . LV dysfunction     known severe LV dysfunction/severe ischemic cardiomyopathy  . Cardiac arrest - ventricular fibrillation 09/02/2008    Out of hospital cardiac arrest secondary to arrhythmia  . History of methicillin resistant staphylococcus aureus (MRSA)     History of methicillin-sensitive  Staphylococcus aureus bacteremia  with implantable cardioverter-defibrillator explant, February 23, 2009  . CHF (congestive heart failure)   . Pericarditis     history of acute pericarditis in July 2010 now resolved  . Anemia     Past Surgical History  Procedure Date  . Ankle surgery   . Icd implant 02/23/2009     with subsequent extraction for MSSA bacteremia  . Icd reimplantation 05/09/2009    status post implant of a Medtronic Maximo II DR dual- chamber cardioverter defibrillator by Dr. Hillis Range  . Cardiac catheterization 08/26/2008    Est. EF of 45% -- Successful intracoronary stenting of the mid-LAD -- Three-vessel  atherosclerotic coronary artery disease.  The patient has occlusion of the mid LAD, which is his culprit lesion.  He has  moderate disease in the proximal circumflex and PDA -- Moderate left ventricular dysfunction -- Kiriana Worthington M. [place], [name]  . Cardiac catheterization 03/16/2009     EF was approximately 30% -- Atherosclerotic coronary vascular disease, three-vessel --  Moderate-to-severe ischemic cardiomyopathy -- No clear evidence for an acute culprit lesion that would explain the patient's chest pain -- [name], [name].     History  Smoking status  . Current Some Day Smoker -- 37 years  . Types: Cigarettes  Smokeless tobacco  . Not on file  Comment: 1 pack per week. Has been smoking for 35 years.     History  Alcohol Use No    Family History  Problem Relation Age of Onset  . Stroke Mother   . Coronary artery disease Father     Review of Systems: As noted in history of present illness. All other systems were reviewed and are negative.  Physical Exam: BP 118/62  Pulse 68  Ht 5\' 7"  (1.702 m)  Wt 197 lb (89.359 kg)  BMI 30.85 kg/m2 He is a pleasant white male in no acute distress. He is normocephalic, atraumatic. His pupils are equal round and reactive to light and accommodation. Sclera clear. Oropharynx is clear. Neck is supple no JVD, adenopathy, thyromegaly, or bruits. Lungs are clear. Cardiac exam reveals a regular rate and rhythm with a soft 1/6 systolic ejection murmur at the right upper sternal border. He has no rub. Chest CT site looks good. Abdomen is soft and nontender without masses or bruits. He has no edema. Pedal pulses are good. He is alert and oriented x3. Cranial nerves II through XII are intact. LABORATORY DATA: ECG demonstrates an atrially paced rhythm. There is evidence of the old anterior infarction with poor R waves in V1 through V4.  Assessment / Plan:

## 2011-05-01 LAB — CBC WITH DIFFERENTIAL/PLATELET
Basophils Absolute: 0.3 10*3/uL — ABNORMAL HIGH (ref 0.0–0.1)
Eosinophils Absolute: 0.3 10*3/uL (ref 0.0–0.7)
Hemoglobin: 15.6 g/dL (ref 13.0–17.0)
Lymphocytes Relative: 28.7 % (ref 12.0–46.0)
Lymphs Abs: 3 10*3/uL (ref 0.7–4.0)
MCHC: 33.5 g/dL (ref 30.0–36.0)
Monocytes Relative: 6.3 % (ref 3.0–12.0)
Neutro Abs: 6.2 10*3/uL (ref 1.4–7.7)
Platelets: 266 10*3/uL (ref 150.0–400.0)
RDW: 13 % (ref 11.5–14.6)

## 2011-05-01 LAB — BASIC METABOLIC PANEL
CO2: 29 mEq/L (ref 19–32)
Calcium: 9.3 mg/dL (ref 8.4–10.5)
Creatinine, Ser: 1 mg/dL (ref 0.4–1.5)
GFR: 82.91 mL/min (ref >60.00)
Glucose, Bld: 77 mg/dL (ref 70–99)
Sodium: 140 mEq/L (ref 135–145)

## 2011-05-01 LAB — PROTIME-INR: Prothrombin Time: 11.8 s (ref 10.2–12.4)

## 2011-05-01 LAB — APTT: aPTT: 32.2 s — ABNORMAL HIGH (ref 21.7–28.8)

## 2011-05-02 ENCOUNTER — Ambulatory Visit (HOSPITAL_COMMUNITY): Payer: Medicare Other

## 2011-05-02 ENCOUNTER — Telehealth: Payer: Self-pay | Admitting: *Deleted

## 2011-05-02 ENCOUNTER — Inpatient Hospital Stay (HOSPITAL_COMMUNITY)
Admission: RE | Admit: 2011-05-02 | Discharge: 2011-05-03 | DRG: 287 | Disposition: A | Discharge status: Home / Self Care | Payer: Medicare Other | Source: Ambulatory Visit | Attending: Cardiology | Admitting: Cardiology

## 2011-05-02 DIAGNOSIS — I2510 Atherosclerotic heart disease of native coronary artery without angina pectoris: Secondary | ICD-10-CM

## 2011-05-02 DIAGNOSIS — E279 Disorder of adrenal gland, unspecified: Secondary | ICD-10-CM | POA: Diagnosis present

## 2011-05-02 DIAGNOSIS — T82897A Other specified complication of cardiac prosthetic devices, implants and grafts, initial encounter: Secondary | ICD-10-CM | POA: Diagnosis present

## 2011-05-02 DIAGNOSIS — I10 Essential (primary) hypertension: Secondary | ICD-10-CM | POA: Diagnosis present

## 2011-05-02 DIAGNOSIS — Y831 Surgical operation with implant of artificial internal device as the cause of abnormal reaction of the patient, or of later complication, without mention of misadventure at the time of the procedure: Secondary | ICD-10-CM | POA: Diagnosis present

## 2011-05-02 DIAGNOSIS — E785 Hyperlipidemia, unspecified: Secondary | ICD-10-CM | POA: Diagnosis present

## 2011-05-02 DIAGNOSIS — I509 Heart failure, unspecified: Secondary | ICD-10-CM | POA: Diagnosis present

## 2011-05-02 DIAGNOSIS — I2589 Other forms of chronic ischemic heart disease: Secondary | ICD-10-CM | POA: Diagnosis present

## 2011-05-02 DIAGNOSIS — Z9119 Patient's noncompliance with other medical treatment and regimen: Secondary | ICD-10-CM

## 2011-05-02 DIAGNOSIS — Z91199 Patient's noncompliance with other medical treatment and regimen due to unspecified reason: Secondary | ICD-10-CM

## 2011-05-02 DIAGNOSIS — I5022 Chronic systolic (congestive) heart failure: Secondary | ICD-10-CM | POA: Diagnosis present

## 2011-05-02 DIAGNOSIS — I200 Unstable angina: Secondary | ICD-10-CM | POA: Diagnosis present

## 2011-05-02 DIAGNOSIS — D649 Anemia, unspecified: Secondary | ICD-10-CM | POA: Diagnosis present

## 2011-05-02 DIAGNOSIS — Z9861 Coronary angioplasty status: Secondary | ICD-10-CM

## 2011-05-02 DIAGNOSIS — R9439 Abnormal result of other cardiovascular function study: Secondary | ICD-10-CM | POA: Diagnosis present

## 2011-05-02 DIAGNOSIS — R0989 Other specified symptoms and signs involving the circulatory and respiratory systems: Secondary | ICD-10-CM

## 2011-05-02 DIAGNOSIS — Z79899 Other long term (current) drug therapy: Secondary | ICD-10-CM

## 2011-05-02 DIAGNOSIS — Z01818 Encounter for other preprocedural examination: Secondary | ICD-10-CM

## 2011-05-02 DIAGNOSIS — Z95810 Presence of automatic (implantable) cardiac defibrillator: Secondary | ICD-10-CM

## 2011-05-02 DIAGNOSIS — Z8614 Personal history of Methicillin resistant Staphylococcus aureus infection: Secondary | ICD-10-CM

## 2011-05-02 DIAGNOSIS — Z7982 Long term (current) use of aspirin: Secondary | ICD-10-CM

## 2011-05-02 DIAGNOSIS — Z8674 Personal history of sudden cardiac arrest: Secondary | ICD-10-CM

## 2011-05-02 DIAGNOSIS — I252 Old myocardial infarction: Secondary | ICD-10-CM

## 2011-05-02 LAB — PROTIME-INR: INR: 1.02 (ref 0.00–1.49)

## 2011-05-02 MED ORDER — IOHEXOL 300 MG/ML  SOLN
100.0000 mL | Freq: Once | INTRAMUSCULAR | Status: AC | PRN
  Administered 2011-05-02: 100 mL via INTRAVENOUS

## 2011-05-02 NOTE — Telephone Encounter (Signed)
Pre cath chest xray

## 2011-05-02 NOTE — Cardiovascular Report (Signed)
NAME:  [name], [name] NO.:  [id number]  MEDICAL RECORD NO.:  [id number]  LOCATION:  [facility]                         FACILITY:  [facility]  PHYSICIAN:  Rainee Sweatt M. [place], [name].  DATE OF BIRTH:  1956-06-17  DATE OF PROCEDURE: DATE OF DISCHARGE:                           CARDIAC CATHETERIZATION   INDICATION FOR PROCEDURE:  A 55 year old white male with a complex cardiac history.  He has had a previous anterior myocardial infarction with emergent stenting of the mid LAD.  He subsequently had out-of- hospital cardiac arrest and had a defibrillator placed.  This later had to be removed and replaced after he developed pericarditis and Staph bacteremia.  He has been lost to follow up for the past 2 years and has been noncompliant with medical therapy.  He continues to smoke. Recently, he underwent a stress Cardiolite study which demonstrated an evidence of a large anterior infarct with severe inferior wall ischemia. Ejection fraction was 28%.  For this reason, we recommended cardiac catheterization.  PROCEDURES: 1. Left heart catheterization. 2. Coronary and left ventricular angiography.  Access is via the right     radial artery using the standard Seldinger technique.  EQUIPMENT:  A 5-French 4-cm right Judkins catheter, 5-French 3.5-cm left Judkins catheter, 5-French pigtail catheter, 5-French arterial sheath.  MEDICATIONS: 1. Local anesthesia with 1% Xylocaine. 2. Verapamil 3 mg arterial. 3. Heparin 5000 units IV. 4. Versed 2 mg IV. 5. Fentanyl 25 mcg IV.  CONTRAST:  Omnipaque 90 mL.  There were no complications.  ANGIOGRAPHIC DATA:  The right coronary artery arises and distributes normally.  It has a 99% stenosis at the crux with TIMI grade 1 flow.  The left coronary artery arises and distributes normally.  There is diffuse 20% narrowing of the left main coronary artery.  The left anterior descending artery is a large vessel which wraps around the apex.  There  is an 80% stenosis within the stent in the midvessel in a segmental fashion.  There is also a 90% stenosis in the LAD distally at the apex.  The left circumflex coronary has a 70%-80% stenosis in the midvessel. There is also a 70%-80% stenosis in the first obtuse marginal vessel.  The left ventricular angiography was performed in the RAO view.  This demonstrates severe global hypokinesis with apical dyskinesis.  There appears to be a small filling defect at the apex consistent with thrombus.  IMPRESSION: 1. Severe three-vessel obstructive coronary artery disease.  The     patient has significant progression of disease compared to 2010.     He has in-stent restenosis of the left anterior descending artery. 2. Severe left ventricular dysfunction. 3. Probable small apical thrombus.  PLAN:  Given the critical nature of his disease, I have recommended admission for IV heparin.  We will obtain Cardiac Surgery consult.  He will need to be considered for bypass surgery.          ______________________________ Pamela Maddy M. [place], [name].     PMJ/MEDQ  D:  05/02/2011  T:  05/02/2011  Job:  [id number]  cc:   Acey Lav, MD [facility] Evlyn Kanner, [name].  Electronically Signed by Britny Riel [place] [name]. on 05/02/2011 06:48:04 PM

## 2011-05-02 NOTE — Telephone Encounter (Signed)
Message copied by Lorayne Bender on Thu May 02, 2011 10:07 AM ------      Message from: [place], PETER M      Created: Wed May 01, 2011  4:06 PM       Labs all ok.      Theron Arista [place]

## 2011-05-02 NOTE — Telephone Encounter (Signed)
Message copied by Lorayne Bender on Thu May 02, 2011 10:06 AM ------      Message from: [place], PETER M      Created: Tue Apr 30, 2011  5:27 PM       Stable CXR.

## 2011-05-02 NOTE — Telephone Encounter (Signed)
Pre cath labs

## 2011-05-03 ENCOUNTER — Other Ambulatory Visit (HOSPITAL_COMMUNITY): Payer: Medicare Other

## 2011-05-03 DIAGNOSIS — I200 Unstable angina: Secondary | ICD-10-CM

## 2011-05-03 LAB — BLOOD GAS, ARTERIAL
Acid-base deficit: 0.7 mmol/L (ref 0.0–2.0)
Drawn by: 347621
O2 Content: 0.2 L/min
O2 Saturation: 96.8 %
Patient temperature: 98.6
pCO2 arterial: 34.6 mmHg — ABNORMAL LOW (ref 35.0–45.0)

## 2011-05-03 LAB — URINALYSIS, ROUTINE W REFLEX MICROSCOPIC
Bilirubin Urine: NEGATIVE
Ketones, ur: NEGATIVE mg/dL
Nitrite: NEGATIVE
Specific Gravity, Urine: 1.017 (ref 1.005–1.030)
Urobilinogen, UA: 1 mg/dL (ref 0.0–1.0)

## 2011-05-03 LAB — BASIC METABOLIC PANEL
BUN: 11 mg/dL (ref 6–23)
Calcium: 9.7 mg/dL (ref 8.4–10.5)
Chloride: 104 mEq/L (ref 96–112)
Creatinine, Ser: 0.85 mg/dL (ref 0.50–1.35)
GFR calc Af Amer: >60 mL/min (ref >60)

## 2011-05-03 LAB — LIPID PANEL
Total CHOL/HDL Ratio: 5.1 RATIO
VLDL: 21 mg/dL (ref 0–40)

## 2011-05-03 LAB — CBC
Hemoglobin: 15.6 g/dL (ref 13.0–17.0)
MCH: 31.9 pg (ref 26.0–34.0)
MCHC: 34.4 g/dL (ref 30.0–36.0)
MCV: 92.6 fL (ref 78.0–100.0)
Platelets: 246 10*3/uL (ref 150–400)
RBC: 4.89 MIL/uL (ref 4.22–5.81)
WBC: 10.6 10*3/uL — ABNORMAL HIGH (ref 4.0–10.5)

## 2011-05-03 LAB — SURGICAL PCR SCREEN
MRSA, PCR: NEGATIVE
Staphylococcus aureus: NEGATIVE

## 2011-05-03 NOTE — Consult Note (Signed)
NAMECASHMERE, HARMES NO.:  [id number]  MEDICAL RECORD NO.:  [id number]  LOCATION:  2014                         FACILITY:  [facility]  PHYSICIAN:  Salvatore Decent. [name], [name]. DATE OF BIRTH:  May 22, 1956  DATE OF CONSULTATION:  05/02/2011 DATE OF DISCHARGE:                                CONSULTATION   REQUESTING PHYSICIAN:  [name], MD  REASON FOR CONSULTATION:  Severe three-vessel coronary artery disease with ischemic cardiomyopathy.  HISTORY OF PRESENT ILLNESS:  [name] is a 55 year old male with known history of coronary artery disease that dates back to January 2010.  At that time, he presented with acute anterior wall myocardial infarction.  He was treated with PCI and stenting of the left anterior descending coronary artery including three consecutive drug-eluting stents.  He initially did well, although 1 week later, he suffered an out of hospital cardiac arrest and was successfully defibrillated. Repeat catheterization performed at that time demonstrated 50% stenosis in the midportion of the left anterior descending coronary artery within the previous stent.  He underwent repeat PCI and stenting.  He also had a defibrillator placed.  In July 2010, he developed pericarditis as well as Staph bacteremia.  His defibrillator was removed and later replaced in September 2010.  Since then, he has remained clinically stable.  He was actually lost a follow up for period of time.  He stopped taking Plavix after a year.  More recently, he returned for follow up.  His defibrillator was interrogated and apparently is functioning normally. It has never fired.  He return to see Dr. Swaziland in follow up and underwent a followup stress Myoview exam.  This demonstrated a large anterior wall myocardial infarction with resting ejection fraction of 28%.  However, there was also significant area of inferior wall ischemia.  Subsequently, the patient was brought in for  elective cardiac catheterization today.  Catheterization demonstrates severe three-vessel coronary artery disease with severe left ventricular dysfunction, ischemic cardiomyopathy.  There was also felt to be possibility of small apical thrombus.  Cardiothoracic surgical consultation was requested.  REVIEW OF SYSTEMS:  GENERAL:  The patient reports normal appetite.  He has not been gaining nor losing weight recently.  He is 5 feet and 7 inches tall and weighs approximately 197 pounds.  CARDIAC:  The patient denies any history of significant chest pressure.  He does report that 3 weeks ago he had an episode of burning substernal pain.  This was distinctly different from the chest pain that he had when he had his myocardial infarction.  He does admit to exertional shortness of breath. He denies resting shortness of breath, PND, orthopnea, lower extremity edema.  RESPIRATORY:  Notable for intermittent dry nonproductive cough. The patient denies productive cough, hemoptysis, wheezing. GASTROINTESTINAL:  Negative.  The patient has no difficulty swallowing. He reports normal bowel function.  He denies hematochezia, hematemesis, melena.  MUSCULOSKELETAL:  Notable for chronic pain and swelling in his right ankle that dates back to a previous ankle injury in the distant past.  He otherwise denies significant arthritis or arthralgias.  He is able to ambulate reasonably well without any limitations.  NEUROLOGIC: Negative.  The patient denies  symptoms suggestive of previous TIA or stroke.  GENITOURINARY:  Negative.  HEENT:  Negative.  INFECTIOUS: Negative.  PAST MEDICAL HISTORY: 1. Coronary artery disease. 2. Ischemic cardiomyopathy. 3. Polymorphic ventricular tachycardia, status post defibrillator     placement. 4. Hypertension. 5. Hyperlipidemia. 6. Remote history of pericarditis. 7. Remote history of methicillin-resistant Staphylococcus aureus     bacteremia. 8. Anemia. 9. Right adrenal  mass.  PAST SURGICAL HISTORY: 1. Right ankle surgery. 2. Defibrillator placement. 3. Defibrillator extraction. 4. Defibrillator reimplantation (Medtronic Maximo II DR dual-chamber     cardioverted defibrillator)  FAMILY HISTORY:  Noncontributory.  SOCIAL HISTORY:  The patient is married.  He lives with his wife in [place].  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  The patient continues to smoke cigarettes and has smoked at least half pack to one pack cigarettes daily for many years.  The patient does not use alcohol.  MEDICATIONS PRIOR TO ADMISSION: 1. Aspirin. 2. Lipitor. 3. Metoprolol. 4. Altace.  DRUG ALLERGIES:  None known.  PHYSICAL EXAMINATION:  GENERAL:  The patient is a well-appearing mildly obese male who appears his stated age, in no acute distress. HEENT:  Unrevealing. NECK:  Supple.  There is no cervical nor supraclavicular lymphadenopathy.  There is no jugular venous distention.  There are no carotid bruits. CHEST:  Auscultation of the chest demonstrates clear breath sounds, which are symmetrical anteriorly.  No wheezes, rales, or rhonchi noted. CARDIOVASCULAR:  Notable for regular rate and rhythm.  No murmurs, rubs or gallops are appreciated. ABDOMEN:  Soft, nondistended, and nontender.  Bowel sounds are present. EXTREMITIES:  Warm and well perfused.  There is moderate right lower extremity edema.  There is no lower extremity edema on the left.  Distal pulses are palpable in the posterior tibial position bilaterally. SKIN:  Clean, dry, and healthy-appearing throughout. RECTAL:  Deferred. GU:  Deferred. NEUROLOGIC:  Grossly nonfocal and symmetrical throughout.  DIAGNOSTIC TEST:  Cardiac catheterization performed today by Dr. Swaziland is reviewed.  This demonstrates 80% in-stent restenosis of the left anterior descending coronary artery in the midportion of the vessel. There is 90% stenosis of the distal left anterior  descending coronary artery after wraps the apex of the heart.  There is 80% stenosis in the midportion of the left circumflex coronary artery.  This vessel gives rise to a single bifurcating obtuse marginal branch.  There is subtotal occlusion of the right coronary artery with TIMI 1 flow.  The distal right coronary artery is not well visualized.  On previous cardiac catheterization in 2010, there is right dominant coronary circulation with somewhat diffusely diseased posterior descending coronary artery. There is severe left ventricular dysfunction with severe anteroapical and infraapical dyskinesis.  Possibility of a small apical aneurysm with or without mural thrombus cannot be definitively ruled out.  IMPRESSION:  Severe three-vessel coronary artery disease with in-stent restenosis of the mid-left anterior descending coronary artery, history of massive anterior apical myocardial infarction in 2010.  The patient is clinically stable.  I do agree that he would probably be best treated with surgical revascularization.  PLAN:  We await results of CT scan of the abdomen that has been ordered to reassess follow up of the right adrenal mass.  If there is no sign of any underlying malignancy, I think it would be reasonable to proceed with surgery.  We could tentatively plan to proceed with coronary artery bypass grafting tomorrow.  We will plan intraoperative transesophageal echocardiogram to further evaluate left ventricular  function as well as the possibility of anterior apical mural thrombus and/or aneurysm.  I have discussed options at length with [name] and his wife. Alternative treatment strategies have been discussed.  They understand and accept all associated risks of surgery including, but not limited to risk of death, stroke, myocardial infarction, congestive heart failure, respiratory failure, pneumonia, bleeding requiring blood transfusion, arrhythmia, infection, late  recurrence of coronary artery disease.  All of their questions have been addressed.     Salvatore Decent. [name], [name].     CHO/MEDQ  D:  05/02/2011  T:  05/02/2011  Job:  [id number]  cc:   [name], [name]. Acey Lav, MD  Electronically Signed by Tressie Stalker [name]. on 05/03/2011 05:31:31 AM

## 2011-05-04 LAB — TYPE AND SCREEN: Unit division: 0

## 2011-05-07 ENCOUNTER — Inpatient Hospital Stay (HOSPITAL_COMMUNITY)
Admission: RE | Admit: 2011-05-07 | Discharge: 2011-05-11 | DRG: 235 | Disposition: A | Discharge status: Home / Self Care | Payer: Medicare Other | Source: Ambulatory Visit | Attending: Thoracic Surgery (Cardiothoracic Vascular Surgery) | Admitting: Thoracic Surgery (Cardiothoracic Vascular Surgery)

## 2011-05-07 ENCOUNTER — Inpatient Hospital Stay (HOSPITAL_COMMUNITY): Payer: Medicare Other

## 2011-05-07 DIAGNOSIS — I2510 Atherosclerotic heart disease of native coronary artery without angina pectoris: Secondary | ICD-10-CM

## 2011-05-07 DIAGNOSIS — I472 Ventricular tachycardia, unspecified: Secondary | ICD-10-CM | POA: Diagnosis not present

## 2011-05-07 DIAGNOSIS — I4901 Ventricular fibrillation: Secondary | ICD-10-CM | POA: Diagnosis not present

## 2011-05-07 DIAGNOSIS — I10 Essential (primary) hypertension: Secondary | ICD-10-CM | POA: Diagnosis present

## 2011-05-07 DIAGNOSIS — Z9861 Coronary angioplasty status: Secondary | ICD-10-CM

## 2011-05-07 DIAGNOSIS — I252 Old myocardial infarction: Secondary | ICD-10-CM

## 2011-05-07 DIAGNOSIS — E8779 Other fluid overload: Secondary | ICD-10-CM | POA: Diagnosis not present

## 2011-05-07 DIAGNOSIS — Z87891 Personal history of nicotine dependence: Secondary | ICD-10-CM

## 2011-05-07 DIAGNOSIS — Z95810 Presence of automatic (implantable) cardiac defibrillator: Secondary | ICD-10-CM

## 2011-05-07 DIAGNOSIS — D62 Acute posthemorrhagic anemia: Secondary | ICD-10-CM | POA: Diagnosis not present

## 2011-05-07 DIAGNOSIS — I2589 Other forms of chronic ischemic heart disease: Secondary | ICD-10-CM | POA: Diagnosis present

## 2011-05-07 DIAGNOSIS — Z79899 Other long term (current) drug therapy: Secondary | ICD-10-CM

## 2011-05-07 DIAGNOSIS — E785 Hyperlipidemia, unspecified: Secondary | ICD-10-CM | POA: Diagnosis present

## 2011-05-07 DIAGNOSIS — Y9210 Unspecified residential institution as the place of occurrence of the external cause: Secondary | ICD-10-CM | POA: Diagnosis not present

## 2011-05-07 DIAGNOSIS — Z7982 Long term (current) use of aspirin: Secondary | ICD-10-CM

## 2011-05-07 DIAGNOSIS — D72829 Elevated white blood cell count, unspecified: Secondary | ICD-10-CM | POA: Diagnosis not present

## 2011-05-07 DIAGNOSIS — K219 Gastro-esophageal reflux disease without esophagitis: Secondary | ICD-10-CM | POA: Diagnosis present

## 2011-05-07 DIAGNOSIS — I519 Heart disease, unspecified: Secondary | ICD-10-CM | POA: Diagnosis not present

## 2011-05-07 DIAGNOSIS — I4729 Other ventricular tachycardia: Secondary | ICD-10-CM | POA: Diagnosis not present

## 2011-05-07 DIAGNOSIS — Y832 Surgical operation with anastomosis, bypass or graft as the cause of abnormal reaction of the patient, or of later complication, without mention of misadventure at the time of the procedure: Secondary | ICD-10-CM | POA: Diagnosis not present

## 2011-05-07 DIAGNOSIS — R110 Nausea: Secondary | ICD-10-CM | POA: Diagnosis not present

## 2011-05-07 DIAGNOSIS — Z8614 Personal history of Methicillin resistant Staphylococcus aureus infection: Secondary | ICD-10-CM

## 2011-05-07 DIAGNOSIS — T462X5A Adverse effect of other antidysrhythmic drugs, initial encounter: Secondary | ICD-10-CM | POA: Diagnosis not present

## 2011-05-07 HISTORY — PX: CORONARY ARTERY BYPASS GRAFT: SHX141

## 2011-05-07 LAB — POCT I-STAT, CHEM 8
BUN: 12 mg/dL (ref 6–23)
Calcium, Ion: 1.2 mmol/L (ref 1.12–1.32)
Creatinine, Ser: 0.8 mg/dL (ref 0.50–1.35)
Hemoglobin: 12.2 g/dL — ABNORMAL LOW (ref 13.0–17.0)
TCO2: 23 mmol/L (ref 0–100)

## 2011-05-07 LAB — GLUCOSE, CAPILLARY
Glucose-Capillary: 124 mg/dL — ABNORMAL HIGH (ref 70–99)
Glucose-Capillary: 137 mg/dL — ABNORMAL HIGH (ref 70–99)
Glucose-Capillary: 142 mg/dL — ABNORMAL HIGH (ref 70–99)
Glucose-Capillary: 140 mg/dL — ABNORMAL HIGH (ref 70–99)

## 2011-05-07 LAB — POCT I-STAT 3, ART BLOOD GAS (G3+)
Acid-base deficit: 4 mmol/L — ABNORMAL HIGH (ref 0.0–2.0)
Acid-base deficit: 4 mmol/L — ABNORMAL HIGH (ref 0.0–2.0)
Bicarbonate: 22.5 mEq/L (ref 20.0–24.0)
Bicarbonate: 20.1 mEq/L (ref 20.0–24.0)
Patient temperature: 35.5
Patient temperature: 36.3
TCO2: 24 mmol/L (ref 0–100)
pCO2 arterial: 44.3 mmHg (ref 35.0–45.0)
pCO2 arterial: 38.4 mmHg (ref 35.0–45.0)
pH, Arterial: 7.313 — ABNORMAL LOW (ref 7.350–7.450)
pH, Arterial: 7.378 (ref 7.350–7.450)
pH, Arterial: 7.324 — ABNORMAL LOW (ref 7.350–7.450)

## 2011-05-07 LAB — CBC
MCH: 31.8 pg (ref 26.0–34.0)
MCH: 32.5 pg (ref 26.0–34.0)
MCHC: 34.3 g/dL (ref 30.0–36.0)
MCV: 92.8 fL (ref 78.0–100.0)
MCV: 93.4 fL (ref 78.0–100.0)
Platelets: 123 10*3/uL — ABNORMAL LOW (ref 150–400)
Platelets: 160 10*3/uL (ref 150–400)
RBC: 3.49 MIL/uL — ABNORMAL LOW (ref 4.22–5.81)
RDW: 12.9 % (ref 11.5–15.5)
WBC: 17.5 10*3/uL — ABNORMAL HIGH (ref 4.0–10.5)

## 2011-05-07 LAB — POCT I-STAT 4, (NA,K, GLUC, HGB,HCT)
Glucose, Bld: 100 mg/dL — ABNORMAL HIGH (ref 70–99)
Glucose, Bld: 82 mg/dL (ref 70–99)
Glucose, Bld: 122 mg/dL — ABNORMAL HIGH (ref 70–99)
HCT: 45 % (ref 39.0–52.0)
HCT: 30 % — ABNORMAL LOW (ref 39.0–52.0)
HCT: 30 % — ABNORMAL LOW (ref 39.0–52.0)
HCT: 33 % — ABNORMAL LOW (ref 39.0–52.0)
Hemoglobin: 10.2 g/dL — ABNORMAL LOW (ref 13.0–17.0)
Hemoglobin: 11.2 g/dL — ABNORMAL LOW (ref 13.0–17.0)
Potassium: 6.1 mEq/L — ABNORMAL HIGH (ref 3.5–5.1)
Potassium: 4.4 mEq/L (ref 3.5–5.1)
Sodium: 138 mEq/L (ref 135–145)
Sodium: 125 mEq/L — ABNORMAL LOW (ref 135–145)
Sodium: 128 mEq/L — ABNORMAL LOW (ref 135–145)
Sodium: 136 mEq/L (ref 135–145)
Sodium: 138 mEq/L (ref 135–145)

## 2011-05-07 LAB — PLATELET COUNT: Platelets: 137 10*3/uL — ABNORMAL LOW (ref 150–400)

## 2011-05-07 LAB — PROTIME-INR: Prothrombin Time: 18.2 seconds — ABNORMAL HIGH (ref 11.6–15.2)

## 2011-05-08 ENCOUNTER — Inpatient Hospital Stay (HOSPITAL_COMMUNITY): Payer: Medicare Other

## 2011-05-08 LAB — GLUCOSE, CAPILLARY
Glucose-Capillary: 123 mg/dL — ABNORMAL HIGH (ref 70–99)
Glucose-Capillary: 123 mg/dL — ABNORMAL HIGH (ref 70–99)
Glucose-Capillary: 125 mg/dL — ABNORMAL HIGH (ref 70–99)
Glucose-Capillary: 126 mg/dL — ABNORMAL HIGH (ref 70–99)
Glucose-Capillary: 133 mg/dL — ABNORMAL HIGH (ref 70–99)
Glucose-Capillary: 141 mg/dL — ABNORMAL HIGH (ref 70–99)
Glucose-Capillary: 160 mg/dL — ABNORMAL HIGH (ref 70–99)
Glucose-Capillary: 164 mg/dL — ABNORMAL HIGH (ref 70–99)

## 2011-05-08 LAB — MAGNESIUM: Magnesium: 1.7 mg/dL (ref 1.5–2.5)

## 2011-05-08 LAB — CBC
MCHC: 34.6 g/dL (ref 30.0–36.0)
Platelets: 151 10*3/uL (ref 150–400)
Platelets: 160 10*3/uL (ref 150–400)
RBC: 3.65 MIL/uL — ABNORMAL LOW (ref 4.22–5.81)
RDW: 12.9 % (ref 11.5–15.5)
WBC: 19.7 10*3/uL — ABNORMAL HIGH (ref 4.0–10.5)

## 2011-05-08 LAB — BASIC METABOLIC PANEL
GFR calc Af Amer: >60 mL/min (ref >60)
GFR calc non Af Amer: >60 mL/min (ref >60)
Potassium: 3.7 mEq/L (ref 3.5–5.1)
Sodium: 137 mEq/L (ref 135–145)

## 2011-05-08 LAB — CREATININE, SERUM
Creatinine, Ser: 0.89 mg/dL (ref 0.50–1.35)
GFR calc Af Amer: >60 mL/min (ref >60)

## 2011-05-08 LAB — POCT I-STAT, CHEM 8
HCT: 36 % — ABNORMAL LOW (ref 39.0–52.0)
Hemoglobin: 12.2 g/dL — ABNORMAL LOW (ref 13.0–17.0)
Sodium: 137 mEq/L (ref 135–145)
TCO2: 24 mmol/L (ref 0–100)

## 2011-05-08 NOTE — Op Note (Signed)
NAMETODD, JELINSKI NO.:  [id number]  MEDICAL RECORD NO.:  [id number]  LOCATION:  2309                         FACILITY:  [facility]  PHYSICIAN:  Salvatore Decent. [name], [name]. DATE OF BIRTH:  03/09/1956  DATE OF PROCEDURE:  05/07/2011 DATE OF DISCHARGE:                              OPERATIVE REPORT   PREOPERATIVE DIAGNOSIS:  Severe three-vessel coronary artery disease.  POSTOPERATIVE DIAGNOSIS:  Severe three-vessel coronary artery disease.  PROCEDURE:  Median sternotomy for coronary artery bypass grafting x3 (left internal mammary artery to distal left anterior descending coronary artery, saphenous vein graft to second obtuse marginal branch of the left circumflex coronary artery, saphenous vein graft to posterior descending coronary artery, endoscopic saphenous vein harvest from right thigh and right lower leg).  SURGEON:  Salvatore Decent. [name], MD  ASSISTANT:  Doree Fudge, PA  ANESTHESIA:  Germaine Pomfret, MD  BRIEF CLINICAL NOTE:  The patient is a 55 year old male with known history of coronary artery disease dating back to 2010 at which time he suffered a massive anterior and apical myocardial infarction.  He was treated with PCI and stenting of the left anterior descending coronary artery at that time.  One week later he suffered out of hospital cardiac arrest and was successfully defibrillated.  He underwent repeat PCI and stenting of the left anterior descending coronary artery.  He subsequently had a defibrillator placed.  That defibrillator was ultimately replaced due to infection in September 2010.  The patient had remained clinically stable until recently when he returned for routine followup.  Stress perfusion scan was abnormal prompting repeat cardiac catheterization.  This was performed by Dr. Peter Swaziland on May 02, 2011.  Findings were notable for progression of three-vessel coronary artery disease with severe left ventricular  dysfunction and ischemic cardiomyopathy.  A full consultation note has been dictated previously. The patient and his wife had been counseled regarding the indications, risks, and potential benefits of surgery.  Alternative treatment strategies have been discussed.  They understand and accept all associated risks of surgery and desired to proceed as described.  OPERATIVE FINDINGS: 1. Severe ischemic cardiomyopathy with severe akinesis of the distal     anterior wall and apex as well as severe hypokinesis or akinesis of     the distal inferior wall. 2. Diffuse adhesions throughout the pericardial space. 3. Good quality left internal mammary artery and saphenous vein     conduit for grafting. 4. Diffusely diseased coronary arteries with poor target vessels for     grafting. 5. Brief episodes of recurrent polymorphic VT and VF early after     separation from bypass requiring DCCV and brief reinstitution of     cardiopulmonary bypass and loading with intravenous amiodarone.  OPERATIVE PROCEDURE IN DETAIL:  The patient was brought to the operating room on the above-mentioned date.  The patient's existing indwelling defibrillator was disabled.  The patient was placed in the supine position on the operating room table.  Central monitoring was established by the anesthesia team under the care and direction of Dr. Jairo Ben.  Specifically, a Swan-Ganz catheter was placed through the right internal jugular approach.  A radial arterial line was placed. Intravenous  antibiotics were administered.  General endotracheal anesthesia was induced uneventfully.  A Foley catheter was placed. Baseline transesophageal echocardiogram was performed by Dr. Jean Rosenthal. This confirms the presence of ischemic cardiomyopathy with severe hypokinesis or akinesis of the distal anterior wall apex and the interventricular septum.  There is trace mitral regurgitation.  No other significant abnormalities are  noted.  A median sternotomy incision was performed and left internal mammary artery was dissected from the chest wall and prepared for bypass grafting.  The left internal mammary artery has good-quality conduit. Simultaneously saphenous vein was obtained from the patient's right thigh using endoscopic vein harvest technique.  The saphenous vein was good-quality conduit.  After the saphenous vein has been removed, the small incision in the right lower extremity was closed with absorbable suture.  Following systemic heparinization, a left internal mammary artery was transected distally and noted to have excellent flow.  The pericardium was opened.  There were dense adhesions obliterating the pericardial space due to presumed history of pericarditis.  Sharp dissection was performed anteriorly to expose the ascending aorta. Dissection was continued to expose the right atrium.  The patient's aorta was cannulated uneventfully.  A conventional two-stage cannula was placed through the right atrium.  Cardiopulmonary bypass was begun.  An antegrade cardioplegic cannula was placed directly in the ascending aorta.  The patient was allowed to cool passively to 32 degrees systemic temperature.  The aortic crossclamp was applied and cold blood cardioplegia was delivered in an antegrade fashion through the aortic root.  The initial cardioplegic arrest was rapid with early diastolic arrest.  Repeat doses of cardioplegia were administered intermittently throughout the crossclamp portion of the operation through the aortic root and subsequently placed vein grafts to maintain completely flat electrocardiogram and left ventricular septal myocardial temperature below 15 degrees centigrade.  Sharp dissection was now performed to free up the heart circumferentially.  Once this has been completed, distal target vessels were selected for bypass grafting.  The distal coronary arteries were all diffusely  diseased with palpable and visible plaque.  The following distal coronary anastomoses were performed: 1. The posterior descending coronary artery was grafted with a     saphenous vein graft in end-to-side fashion.  This vessel was     diffusely diseased and measured 1.0 mm in diameter at the site of     distal grafting.  It was a poor quality target vessel. 2. The second obtuse marginal branch of the left circumflex coronary     artery was grafted with a saphenous vein graft in end-to-side     fashion.  This vessel measured 1.7 mm in diameter and was diffusely     diseased.  It was a poor quality target vessel for grafting due to     diffuse plaque. 3. The distal left anterior descending coronary artery was grafted     with left internal mammary artery in end-to-side fashion.  This     vessel measured 2.2 mm in diameter and was diffusely diseased.  It     was a poor quality target vessel due to diffuse plaque.  Both proximal saphenous vein anastomoses were performed directly to the ascending aorta prior to removal of the aortic crossclamp.  The left internal mammary artery graft was released.  The left ventricular septal myocardial temperature did not rise rapidly.  Subsequently, an antegrade cardioplegic cannula was placed directly in the ascending aorta and a bulldog clamp was reapplied to the left internal mammary artery pedicle. An additional dose  of cardioplegia was administered.  The left internal mammary artery graft was taken down.  The anastomosis was notably patent.  It was thought that perhaps there was some kinking of the vessel due to the severe plaque.  The distal anastomosis was reconstructed in end-to-side fashion after opening the left anterior descending coronary artery of considerably larger distance to create a longer arteriotomy.  Subsequently left internal mammary artery graft was released and left ventricular septal myocardial temperature began to rise normally.   The aortic crossclamp was removed after a total crossclamp time of 99 minutes.  The heart began to beat spontaneously without need for cardioversion. All proximal and distal coronary anastomoses were inspected for hemostasis and appropriate graft orientation.  Epicardial pacing wires were fixed to the right ventricular free wall into the right atrial appendage.  The patient was rewarmed to 37 degrees centigrade temperature.  Low-dose dopamine infusion was begun.  The patient was weaned from cardiopulmonary bypass without difficulty.  The patient's rhythm at separation from bypass was sinus bradycardia.  Atrial pacing was employed.  The patient was weaned from bypass on dopamine at 3 mcg/kg per minute.  Shortly after separation from bypass, the patient developed some recurrent episodes of polymorphic ventricular tachycardia followed by ventricular fibrillation.  The patient was promptly cardioverted and returned to a paced rhythm.  Subsequently the patient developed recurrent VT and VF again and has to be cardioverted a second time.  Cardiopulmonary bypass was reinstituted.  The patient was loaded with IV amiodarone.  The patient was rested on cardiopulmonary bypass. Dopamine infusion was discontinued.  The patient was again weaned from cardiopulmonary bypass without difficulty.  The patient's rhythm at separation from bypass was sinus rhythm with atrial pacing.  Additional cardiopulmonary bypass time was 13 minutes for a total cardiopulmonary bypass time of the operation 127 minutes.  No further arrhythmias were noted.  Followup transesophageal echocardiogram performed by Dr. Jean Rosenthal after separation from bypass demonstrated no significant changes from preoperatively.  The mediastinum was irrigated with saline solution.  Meticulous surgical hemostasis was ascertained.  The mediastinum and left pleural space were drained with 3 chest tubes placed through separate stab  incisions inferiorly.  The pericardium and soft tissues anterior to the aorta were reapproximated loosely.  The sternum was closed using double-strength sternal wire.  The soft tissues anterior to the sternum were closed in multiple layers and the skin was closed with a running subcuticular skin closure.  The patient tolerated the procedure well and then transported to the surgical intensive care unit in stable condition.  There were no intraoperative complications.  All sponge, instrument and needle counts were verified correct at completion of the operation.  No blood products were administered.     Salvatore Decent. [name], [name].     CHO/MEDQ  D:  05/07/2011  T:  05/08/2011  Job:  [id number]  cc:   [name], [name].  Electronically Signed by Tressie Stalker [name]. on 05/08/2011 08:16:27 AM

## 2011-05-09 ENCOUNTER — Inpatient Hospital Stay (HOSPITAL_COMMUNITY): Payer: Medicare Other

## 2011-05-09 LAB — BASIC METABOLIC PANEL
BUN: 13 mg/dL (ref 6–23)
CO2: 27 mEq/L (ref 19–32)
Calcium: 8.8 mg/dL (ref 8.4–10.5)
Creatinine, Ser: 0.95 mg/dL (ref 0.50–1.35)
GFR calc Af Amer: >60 mL/min (ref >60)

## 2011-05-09 LAB — CBC
MCH: 31.6 pg (ref 26.0–34.0)
MCV: 93.2 fL (ref 78.0–100.0)
Platelets: 165 10*3/uL (ref 150–400)
RDW: 13 % (ref 11.5–15.5)

## 2011-05-10 LAB — CBC
MCHC: 34.8 g/dL (ref 30.0–36.0)
MCV: 93.2 fL (ref 78.0–100.0)
Platelets: 189 10*3/uL (ref 150–400)
RDW: 12.6 % (ref 11.5–15.5)
WBC: 15.9 10*3/uL — ABNORMAL HIGH (ref 4.0–10.5)

## 2011-05-15 NOTE — Discharge Summary (Signed)
[name], [name] NO.:  [id number]  MEDICAL RECORD NO.:  [id number]  LOCATION:  2015                         FACILITY:  [facility]  PHYSICIAN:  Salvatore Decent. [name], [name]. DATE OF BIRTH:  Dec 01, 1955  DATE OF ADMISSION:  05/07/2011 DATE OF DISCHARGE:  05/11/2011                              DISCHARGE SUMMARY   ADMITTING DIAGNOSES: 1. History of coronary artery disease (status post acute anterior wall     myocardial infarction 2010, status post mult percutaneous coronary     intervention with stents to the left anterior descending,     ventricular fibrillation arrest) 2. History of pericarditis (methicillin-resistant staph aureus     bacteremia). 3. History of ischemic cardiomyopathy (ejection fraction 30-35%). 4. History of polymorphic ventricular tachycardia (status post ICD     placement). 5. History of congestive heart failure. 6. History of hypertension. 7. History of hyperlipidemia. 8. History of anemia. 9. History of right adrenal mass.  DISCHARGE DIAGNOSES: 1. History of coronary artery disease (status post acute anterior wall     myocardial infarction 2010, status post mult percutaneous coronary     intervention with stents to the left anterior descending,     ventricular fibrillation arrest) 2. History of pericarditis (methicillin-resistant staph aureus     bacteremia). 3. History of ischemic cardiomyopathy (ejection fraction 30-35%). 4. History of polymorphic ventricular tachycardia (status post ICD     placement). 5. History of congestive heart failure. 6. History of hypertension. 7. History of hyperlipidemia. 8. History of anemia. 9. History of right adrenal mass. 10.Leukocytosis.  PROCEDURE:  Median sternotomy for coronary bypass grafting surgery x3 (LIMA to LAD, SVG to OM, SVG to posterior descending coronary artery with EVH from the right thigh and right lower leg by Dr. [name] on May 07, 2011.  HISTORY OF PRESENT ILLNESS:  This is a  55 year old Caucasian male with a known history of coronary artery disease that goes back to January 2010. At that time, he presented with an acute anterior wall myocardial infarction.  He was initially treated with PCI and stent to the LAD. Initially he did well, however, approximately 1 week after, he suffered an out of hospital ventricular fibrillation arrest.  A repeat cardiac catheterization showed a 50% stenosis in the midportion of the LAD within the previous stent.  He underwent another PCI with stenting to the LAD as well as a placement of a defibrillator.  In July 2010 he then developed pericarditis (staph bacteremia).  His defibrillator was removed and then later replaced on May 09, 2009.  He has remained clinically stable since then.  He has been on Plavix for his drug- eluting stents.  Recently he underwent a stress Myoview exam by Dr. Swaziland.  This demonstrated a large anterior wall myocardial infarction with an EF of 28%.  There was also a significant area of inferior wall ischemia.  The patient presented to [facility] on May 02, 2011 in order to undergo a cardiac catheterization.  He was found to have 3- vessel coronary artery disease with severe left ventricular dysfunction, ischemic cardiomyopathy, and a questionable small apical thrombus.  A cardiothoracic consultation was obtained with Dr. [name] for the consideration of coronary  bypass grafting surgery.  A thorough discussion was held with the patient regarding potential risks, complications, and benefits of the surgery.  He was discharged in stable condition on May 03, 2011 and agreed to return on May 07, 2011 in order to undergo the coronary bypass grafting surgery.  BRIEF HOSPITAL COURSE STAY:  The patient was extubated without difficulty early the evening of surgery.  He remained AAI paced.  He was on nitroglycerin for hypertension.  He was eventually able to be weaned off of this after  Lopressor was initiated.  His Swan-Ganz, A-line, chest tubes, and Foley were all removed early in his postoperative course.  He was found to have acute blood loss anemia.  His H and H was 11.3 and 32.7.  He did not require postoperative transfusion.  His last H and H was 12.8 and 36.8 respectively.  He was also found to have leukocytosis postoperatively.  His white count went as high as 19,700, however, continued to decrease and currently is down to 15,900.  The patient remains afebrile and he has no signs of wound infection.  No GU complaints and no pneumonia seen on chest x-ray.  This is most likely secondary to inflammatory reaction to surgery.  The patient was found to be volume overloaded and diuresed accordingly.  The patient was felt surgically stable for transfer from the Intensive Care Unit to [facility] for further convalescence on May 08, 2011.  He continued to progress with cardiac rehab.  He did develop nausea which was felt secondary to amiodarone.  He was maintaining sinus rhythm.  He was A-paced at 39 and therefore the amiodarone was decreased and eventually stopped on postoperative day #4.  Currently on postop day #4, he had a T-max of 99. He is A paced at 80, BP 137/88, O2 sat 98% on room air.  Preop weight 87 kg, today's weight down to 85.6 kg.  PHYSICAL EXAMINATION:  CARDIOVASCULAR:  Regular rate and rhythm. PULMONARY:  Clear. ABDOMEN:  Soft, nontender.  Bowel sounds present. EXTREMITIES:  Mild right lower extremity edema.  Wounds are clean, dry, and continuing to heal.  Provided he remains afebrile, hemodynamically stable, his ICD is reprogrammed and pending morning round evaluation, he will be surgically stable for discharge on May 11, 2011.  He has already been tolerating a diet but has not yet had a bowel movement and he will be given a laxative of choice for constipation.  Epicardial pacing wires are going to be removed today and his chest tube sutures  will be removed in the morning prior to discharge.  Latest laboratory studies are as follows.  CBC done on September 14, H and H 12.8 and 36.8 respectively.  White blood cell count of 15,900, platelet count of 189,000.  BMET done on September 13, potassium 3.7 which already has been supplemented.  Sodium 136, BUN and creatinine 13 and 0.95 respectively.  Last chest x-ray done on May 09, 2011 shows increased opacity, left perihilar region (most likely atelectasis and small pleural effusion, stable cardiomegaly, some elevation of left hemidiaphragm, no pneumothorax).  Discharge instructions include the following: 1. Diet:  Low-sodium heart-healthy. 2. Activity:  The patient may walk up steps.  He may shower.  He is     not to lift more than 10 pounds for 4 weeks.  He is not to drive     until after 4 weeks.  He is to continue with his breathing exercise     daily.  He is to  walk daily and increase his frequency of duration     as tolerates. 3. Wound care.  He is to use soap and water on his wounds.  He is to     contact the office if any wound problems arise.  FOLLOWUP APPOINTMENTS: 1. The patient is to contact Dr. Elvis Coil office for followup     appointment in 2 weeks. 2. The patient has appointment to see Dr. [name] on June 03, 2011 at     9:30 a.m.  45 minutes prior to this office appointment, a chest x-     ray should be obtained.  DISCHARGE MEDICATIONS: 1. Coreg 25 mg p.o. 2 times daily. 2. Lasix 40 mg p.o. daily x4 days. 3. Potassium chloride 20 mEq p.o. daily x4 days. 4. Oxycodone 5 mg 1-2 tabs p.o. q.4-6 hours p.r.n. pain. 5. Enteric-coated aspirin 325 mg p.o. daily. 6. Lipitor 80 mg p.o. at bedtime. 7. Ramipril 10 mg p.o. q.a.m.     Doree Fudge, PA   ______________________________ Salvatore Decent [name], [name].    DZ/MEDQ  D:  05/10/2011  T:  05/10/2011  Job:  [id number]  cc:   [name], [name]. Salvatore Decent. [name], [name].  Electronically Signed by  Doree Fudge PA on 05/13/2011 09:15:48 AM Electronically Signed by Tressie Stalker [name]. on 05/15/2011 08:46:29 AM

## 2011-05-17 NOTE — Discharge Summary (Signed)
[name], ACHORD NO.:  [id number]  MEDICAL RECORD NO.:  [id number]  LOCATION:  2014                         FACILITY:  [facility]  PHYSICIAN:  Veverly Fells. Excell Seltzer, MD  DATE OF BIRTH:  05-Jul-1956  DATE OF ADMISSION:  05/02/2011 DATE OF DISCHARGE:  05/03/2011                              DISCHARGE SUMMARY   PRIMARY CARDIOLOGIST:  [name], MD  DISCHARGE DIAGNOSIS:  Unstable angina.  SECONDARY DIAGNOSES: 1. Multivessel coronary artery disease, pending coronary artery bypass     grafting. 2. Ischemic cardiomyopathy with an ejection fraction of 28%. 3. History of polymorphic ventricular tachycardia and ventricular     fibrillation arrest, status post automatic implantable cardioverter-     defibrillator. 4. Hyperlipidemia. 5. Chronic systolic congestive heart failure. 6. History of adrenal mass on the right measuring 4.2 cm in diameter,     stable since 2010. 7. Hypertension. 8. History of methicillin-resistant Staphylococcus aureus bacteremia. 9. History of pericarditis. 10.Anemia. 11.Status post ankle surgery. 12.Ongoing tobacco abuse.  ALLERGIES:  No known drug allergies.  PROCEDURES:  Left heart cardiac catheterization performed on May 02, 2011 revealing 20% diffuse stenosis in the left main.  Segmental 80% mid in-stent restenosis in the LAD and 90% distal apical LAD stenosis. Left circumflex at 80% mid stenosis.  First obtuse marginal had 70-80% stenosis.  The RCA had 99% stenosis.  He had severe LV global hypokinesis with apical dyskinesis and small apical filling defect consistent with thrombus.  HISTORY OF PRESENT ILLNESS:  A 55 year old male with the above complex problem list who was recently seen in clinic on April 30, 2011 and follow up for recent Myoview study, which demonstrated large anterior infarct as well as significant area of inferior wall ischemia.  EF was 28%.  Given results of the study, decision was made to  pursue catheterization.  HOSPITAL COURSE:  The patient underwent diagnostic catheterization on May 02, 2011, showing severe multivessel CAD as well as global hypokinesis.  It was felt that the patient would benefit from surgical opinion.  The patient was seen by Dr. Tressie Stalker on May 02, 2011, with recommendation for coronary artery bypass grafting. Initially, the surgery was scheduled for May 03, 2011; however in light of the patient's history of adrenal mass, it was felt that he should have CT scan to reevaluate this area first and surgery was postponed until early next week.  CT of his abdomen showed stable 4.2-cm right adrenal mass.  The patient has had no further chest pain after much discussion.  He has been fairly adamant that he would like to be discharged home.  We have worked with Thoracic Surgery to arrange for discharge today and he will return to [facility] on Tuesday, May 07, 2011 at 5:30 a.m. for readmission for bypass surgery.  DISCHARGE LABS:  Hemoglobin 15.6, hematocrit 45.3, WBC 9.6, platelets 246.  INR 1.02.  Sodium 140, potassium 4.1, chloride 104, CO2 24, BUN 11, creatinine 0.85, glucose 87, calcium 9.7.  Total cholesterol 152, triglycerides 104, HDL 30, LDL 101.  DISPOSITION:  The patient will be discharged home today in good condition.  FOLLOWUP PLANS AND APPOINTMENTS:  The patient will return to [facility] on Tuesday May 07, 2011 at 5:30 a.m. for scheduled coronary artery bypass grafting with Dr. Tressie Stalker.  DISCHARGE MEDICATIONS: 1. Nitroglycerin 0.4 mg sublingual p.r.n. chest pain. 2. Aspirin 81 mg daily. 3. Lipitor 80 mg nightly. 4. Metoprolol tartrate 100 mg b.i.d. 5. Ramipril 10 mg daily.  OUTSTANDING LABORATORY STUDIES:  None.  DURATION OF DISCHARGE ENCOUNTERED:  Sixty minutes including physician time.     [name], ANP   ______________________________ Veverly Fells. Excell Seltzer,  MD    CB/MEDQ  D:  05/03/2011  T:  05/04/2011  Job:  [id number]  Electronically Signed by [name] ANP on 05/09/2011 03:24:34 PM Electronically Signed by Tonny Bollman MD on 05/17/2011 09:13:44 PM

## 2011-05-28 ENCOUNTER — Other Ambulatory Visit: Payer: Self-pay | Admitting: Thoracic Surgery (Cardiothoracic Vascular Surgery)

## 2011-05-28 DIAGNOSIS — I2510 Atherosclerotic heart disease of native coronary artery without angina pectoris: Secondary | ICD-10-CM

## 2011-05-29 ENCOUNTER — Encounter: Payer: Self-pay | Admitting: Thoracic Surgery (Cardiothoracic Vascular Surgery)

## 2011-05-31 ENCOUNTER — Ambulatory Visit (INDEPENDENT_AMBULATORY_CARE_PROVIDER_SITE_OTHER): Payer: Medicare Other | Admitting: Nurse Practitioner

## 2011-05-31 ENCOUNTER — Encounter: Payer: Self-pay | Admitting: Nurse Practitioner

## 2011-05-31 DIAGNOSIS — E785 Hyperlipidemia, unspecified: Secondary | ICD-10-CM

## 2011-05-31 DIAGNOSIS — I5020 Unspecified systolic (congestive) heart failure: Secondary | ICD-10-CM

## 2011-05-31 DIAGNOSIS — Z951 Presence of aortocoronary bypass graft: Secondary | ICD-10-CM | POA: Insufficient documentation

## 2011-05-31 NOTE — Progress Notes (Signed)
[name] Date of Birth: May 13, 1956   History of Present Illness: Mr. Alabi is seen back today for a post hospital visit. He is seen for Dr. Swaziland. He has had recent CABG x 3 per Dr. [name] about 3 weeks ago. He is doing great. He will be starting rehab. He is not on pain medicines. He is not short of breath. He is walking some. He is to start rehab next week. No ICD shocks. He feels great!  Current Outpatient Prescriptions on File Prior to Visit  Medication Sig Dispense Refill  . aspirin EC 325 MG tablet Take 325 mg by mouth daily.        . ramipril (ALTACE) 10 MG capsule Take 1 capsule (10 mg total) by mouth daily.  30 capsule  3  . DISCONTD: atorvastatin (LIPITOR) 40 MG tablet Take 1 tablet (40 mg total) by mouth daily.  30 tablet  11  . DISCONTD: carvedilol (COREG) 12.5 MG tablet Take 12.5 mg by mouth 2 (two) times daily with a meal.          Allergies  Allergen Reactions  . Morphine And Related     Past Medical History  Diagnosis Date  . CAD (coronary artery disease)     Est. EF of 45% -- Successful intracoronary stenting of the mid-LAD -- Three-vessel atherosclerotic coronary artery disease.  The patient has occlusion of the mid LAD, which is his culprit lesion.  He has  moderate disease in the proximal circumflex and PDA -- Moderate left ventricular dysfunction -- [name], [name]  . Ischemic cardiomyopathy     severe ischemic cardiomyopathy -- ejection fraction 30-35%  . Polymorphic ventricular tachycardia     with VF arrest, s/p subsequent ICD  . Ejection fraction < 50%     30-35%  . HL (hearing loss)   . CHF NYHA class II      New York Heart Association class II/III heart failure  . Adrenal mass     currently being evaluated -- 4.1 cm right adrenal mass  . Acute anterior myocardial infarction 09/02/2008    post emergent stenting of the LAD in January 2010 -- anterior ST-elevation myocardial infarction  . Hyperlipidemia   . Hypertension   . LV  dysfunction     known severe LV dysfunction/severe ischemic cardiomyopathy  . Cardiac arrest - ventricular fibrillation 09/02/2008    Out of hospital cardiac arrest secondary to arrhythmia  . History of methicillin resistant staphylococcus aureus (MRSA)     History of methicillin-sensitive Staphylococcus aureus bacteremia  with implantable cardioverter-defibrillator explant, February 23, 2009  . CHF (congestive heart failure)   . Pericarditis     history of acute pericarditis in July 2010 now resolved  . Anemia   . S/P CABG (coronary artery bypass graft) Sept 2012    Past Surgical History  Procedure Date  . Ankle surgery   . Icd implant 02/23/2009     with subsequent extraction for MSSA bacteremia  . Icd reimplantation 05/09/2009    status post implant of a Medtronic Maximo II DR dual- chamber cardioverter defibrillator by Dr. Hillis Range  . Cardiac catheterization 08/26/2008    Est. EF of 45% -- Successful intracoronary stenting of the mid-LAD -- Three-vessel atherosclerotic coronary artery disease.  The patient has occlusion of the mid LAD, which is his culprit lesion.  He has  moderate disease in the proximal circumflex and PDA -- Moderate left ventricular dysfunction -- [name], [name]  .  Cardiac catheterization 03/16/2009     EF was approximately 30% -- Atherosclerotic coronary vascular disease, three-vessel --  Moderate-to-severe ischemic cardiomyopathy -- No clear evidence for an acute culprit lesion that would explain the patient's chest pain -- [name], [name].   . Median sternotomy for coronary artery bypass grafting x3 05/08/2011    LIMA to LAD, SVG to OM, SVG to PD    History  Smoking status  . Current Some Day Smoker -- 37 years  . Types: Cigarettes  Smokeless tobacco  . Not on file  Comment: 1 pack per week. Has been smoking for 35 years.     History  Alcohol Use No    Family History  Problem Relation Age of Onset  . Stroke Mother   . Coronary artery  disease Father     Review of Systems: The review of systems is per the HPI.  All other systems were reviewed and are negative.  Physical Exam: BP 108/78  Pulse 70  Ht 5\' 7"  (1.702 m)  Wt 193 lb (87.544 kg)  BMI 30.23 kg/m2 Patient is very pleasant and in no acute distress. Skin is warm and dry. Color is normal.  HEENT is unremarkable. Normocephalic/atraumatic. PERRL. Sclera are nonicteric. Neck is supple. No masses. No JVD. Lungs are clear. Cardiac exam shows a regular rate and rhythm. Sternum looks good. Abdomen is soft. Extremities are without edema. Gait and ROM are intact. No gross neurologic deficits noted.   LABORATORY DATA: EKG shows atrial pacing.    Assessment / Plan:

## 2011-05-31 NOTE — Patient Instructions (Signed)
You are doing well.  Ok to start cardiac rehab. We will see you back in 6 weeks with fasting labs. Will probably plan for repeat ultrasound of your heart in about 3 months.   Try to weigh each day. If you gain 3 pounds overnight, please call us. Continue to watch your salt.  Call for any problems.

## 2011-05-31 NOTE — Assessment & Plan Note (Signed)
Has chronic LV systolic dysfunction. Looks compensated. On beta blocker and ACE. May need aldactone in the future. May need follow up echo in about 3 months. Will defer to Dr. Swaziland. He is currently doing well. I did stress the importance of weighing daily.

## 2011-05-31 NOTE — Assessment & Plan Note (Signed)
He is doing very well. He is tolerating his medicines. We will see him back in 6 weeks with fasting labs. He is ok to start rehab.

## 2011-06-03 ENCOUNTER — Ambulatory Visit (INDEPENDENT_AMBULATORY_CARE_PROVIDER_SITE_OTHER): Payer: Self-pay | Admitting: Thoracic Surgery (Cardiothoracic Vascular Surgery)

## 2011-06-03 ENCOUNTER — Encounter: Payer: Self-pay | Admitting: Thoracic Surgery (Cardiothoracic Vascular Surgery)

## 2011-06-03 ENCOUNTER — Ambulatory Visit
Admission: RE | Admit: 2011-06-03 | Discharge: 2011-06-03 | Disposition: A | Discharge status: Home / Self Care | Payer: Medicare Other | Source: Ambulatory Visit | Attending: Thoracic Surgery (Cardiothoracic Vascular Surgery) | Admitting: Thoracic Surgery (Cardiothoracic Vascular Surgery)

## 2011-06-03 VITALS — BP 142/96 | HR 88 | Resp 18 | Ht 67.0 in | Wt 190.0 lb

## 2011-06-03 DIAGNOSIS — I2510 Atherosclerotic heart disease of native coronary artery without angina pectoris: Secondary | ICD-10-CM

## 2011-06-03 DIAGNOSIS — Z951 Presence of aortocoronary bypass graft: Secondary | ICD-10-CM

## 2011-06-03 NOTE — Progress Notes (Signed)
  HPI: Patient returns for routine postoperative follow-up having undergone CABG x3 on 05/07/2011. The patient's early postoperative recovery while in the hospital was uncomplicated. Since hospital discharge the patient reports he has done very well. He has minimal residual soreness in his chest. He does not have any shortness of breath. His activity level has continued to gradually improve and he is getting along very well. He is sleeping well at night. His appetite is good. Overall he has no complaints. He plans to start the cardiac rehabilitation program sometime within the next week or 2. He is not smoking.   Current Outpatient Prescriptions  Medication Sig Dispense Refill  . aspirin EC 325 MG tablet Take 325 mg by mouth daily.        Marland Kitchen atorvastatin (LIPITOR) 80 MG tablet Take 80 mg by mouth daily.        . carvedilol (COREG) 25 MG tablet Take 25 mg by mouth 2 (two) times daily with a meal.        . ramipril (ALTACE) 10 MG capsule Take 1 capsule (10 mg total) by mouth daily.  30 capsule  3    Physical Exam: On physical exam the patient looks quite good. His sternal incision is healing very nicely and the sternum is stable on palpation. Breath sounds are clear to auscultation bilaterally. Cardiovascular exam is notable for regular rate and rhythm. No murmurs rubs or gallops are appreciated. The abdomen is soft and nontender. Extremities are warm and well-perfused. There is no lower extremity edema. The small incision from endoscopic vein harvest is healed nicely.  Diagnostic Tests: Chest x-ray performed today is reviewed. This demonstrates trivial residual left pleural effusion. All of the sternal wires appear intact. The lung fields are otherwise clear bilaterally.  Impression: The patient is doing very well following recent coronary artery bypass surgery.  Plan: I've encouraged patient to continue to gradually increase his physical activity as tolerated with his primary limitation at this  point remaining that he refrain from any heavy lifting or strenuous use of his arms or shoulders for at least another 2 months. I've encouraged him to get involved in the cardiac rehabilitation program. Have reminded him how important it will be for him to abstain from any tobacco use and to continue to followup with Dr. Swaziland for long-term management of his underlying coronary artery disease, ischemic cardiomyopathy, hypertension, and hyperlipidemia. All of his questions have been addressed. In the future he will call and return to see [place] only as needed.

## 2011-06-03 NOTE — Patient Instructions (Signed)
The patient has been instructed that they may return driving an automobile as long as he no longer requiring oral narcotic pain relievers during the daytime.  They have been advised to start driving short distances during the daylight and gradually increase from there as they feel comfortable.The patient has been reminded to continue to avoid any heavy lifting or strenuous use of arms or shoulders for at least a total of three months from the time of surgery.

## 2011-06-26 ENCOUNTER — Encounter: Payer: Self-pay | Admitting: Internal Medicine

## 2011-06-26 ENCOUNTER — Ambulatory Visit (INDEPENDENT_AMBULATORY_CARE_PROVIDER_SITE_OTHER): Payer: Medicare Other | Admitting: *Deleted

## 2011-06-26 DIAGNOSIS — I469 Cardiac arrest, cause unspecified: Secondary | ICD-10-CM

## 2011-06-26 DIAGNOSIS — I509 Heart failure, unspecified: Secondary | ICD-10-CM

## 2011-06-26 DIAGNOSIS — I472 Ventricular tachycardia: Secondary | ICD-10-CM

## 2011-06-26 LAB — ICD DEVICE OBSERVATION
AL AMPLITUDE: 1.5 mv
AL IMPEDENCE ICD: 418 Ohm
CHARGE TIME: 8.568 s
FVT: 0
RV LEAD AMPLITUDE: 20.125 mv
RV LEAD IMPEDENCE ICD: 475 Ohm
TOT-0001: 1
TOT-0002: 0
TOT-0006: 20100914000000
TZAT-0001FASTVT: 1
TZAT-0001SLOWVT: 1
TZAT-0002FASTVT: NEGATIVE
TZAT-0012FASTVT: 200 ms
TZAT-0012SLOWVT: 200 ms
TZAT-0018FASTVT: NEGATIVE
TZAT-0020SLOWVT: 1.5 ms
TZON-0003VSLOWVT: 380 ms
TZST-0001FASTVT: 4
TZST-0001FASTVT: 6
TZST-0001SLOWVT: 2
TZST-0001SLOWVT: 3
TZST-0001SLOWVT: 4
TZST-0001SLOWVT: 5
TZST-0002FASTVT: NEGATIVE
TZST-0002FASTVT: NEGATIVE
TZST-0002FASTVT: NEGATIVE
TZST-0002SLOWVT: NEGATIVE
TZST-0002SLOWVT: NEGATIVE

## 2011-06-26 NOTE — Progress Notes (Signed)
ICD check 

## 2011-07-08 ENCOUNTER — Ambulatory Visit: Payer: Medicare Other | Admitting: Nurse Practitioner

## 2011-07-08 ENCOUNTER — Other Ambulatory Visit: Payer: Medicare Other | Admitting: *Deleted

## 2011-07-09 ENCOUNTER — Other Ambulatory Visit (INDEPENDENT_AMBULATORY_CARE_PROVIDER_SITE_OTHER): Payer: Medicare Other | Admitting: *Deleted

## 2011-07-09 ENCOUNTER — Other Ambulatory Visit: Payer: Self-pay | Admitting: Thoracic Surgery (Cardiothoracic Vascular Surgery)

## 2011-07-09 ENCOUNTER — Other Ambulatory Visit: Payer: Medicare Other | Admitting: *Deleted

## 2011-07-09 DIAGNOSIS — Z951 Presence of aortocoronary bypass graft: Secondary | ICD-10-CM

## 2011-07-09 DIAGNOSIS — E785 Hyperlipidemia, unspecified: Secondary | ICD-10-CM

## 2011-07-09 DIAGNOSIS — I2510 Atherosclerotic heart disease of native coronary artery without angina pectoris: Secondary | ICD-10-CM

## 2011-07-09 LAB — HEPATIC FUNCTION PANEL
ALT: 17 U/L (ref 0–53)
AST: 16 U/L (ref 0–37)
AST: 19 U/L (ref 0–37)
Albumin: 3.7 g/dL (ref 3.5–5.2)
Albumin: 3.8 g/dL (ref 3.5–5.2)
Alkaline Phosphatase: 126 U/L — ABNORMAL HIGH (ref 39–117)
Alkaline Phosphatase: 132 U/L — ABNORMAL HIGH (ref 39–117)
Bilirubin, Direct: 0.1 mg/dL (ref 0.0–0.3)
Total Bilirubin: 0.6 mg/dL (ref 0.3–1.2)
Total Protein: 7.1 g/dL (ref 6.0–8.3)
Total Protein: 7.4 g/dL (ref 6.0–8.3)

## 2011-07-09 LAB — BASIC METABOLIC PANEL
BUN: 13 mg/dL (ref 6–23)
CO2: 23 mEq/L (ref 19–32)
Calcium: 8.9 mg/dL (ref 8.4–10.5)
Chloride: 109 mEq/L (ref 96–112)
Creatinine, Ser: 0.7 mg/dL (ref 0.4–1.5)
GFR: 117.28 mL/min (ref >60.00)
Glucose, Bld: 93 mg/dL (ref 70–99)
Potassium: 4.2 mEq/L (ref 3.5–5.1)
Sodium: 142 mEq/L (ref 135–145)

## 2011-07-09 LAB — CBC WITH DIFFERENTIAL/PLATELET
Basophils Absolute: 0 10*3/uL (ref 0.0–0.1)
Basophils Relative: 0.4 % (ref 0.0–3.0)
Eosinophils Absolute: 0.4 10*3/uL (ref 0.0–0.7)
Eosinophils Relative: 5.8 % — ABNORMAL HIGH (ref 0.0–5.0)
HCT: 41.2 % (ref 39.0–52.0)
Hemoglobin: 13.8 g/dL (ref 13.0–17.0)
Lymphocytes Relative: 25.9 % (ref 12.0–46.0)
Lymphs Abs: 1.8 10*3/uL (ref 0.7–4.0)
MCHC: 33.5 g/dL (ref 30.0–36.0)
MCV: 96.8 fl (ref 78.0–100.0)
Monocytes Absolute: 0.5 10*3/uL (ref 0.1–1.0)
Monocytes Relative: 7.8 % (ref 3.0–12.0)
Neutro Abs: 4.2 10*3/uL (ref 1.4–7.7)
Neutrophils Relative %: 60.1 % (ref 43.0–77.0)
Platelets: 244 10*3/uL (ref 150.0–400.0)
RBC: 4.25 Mil/uL (ref 4.22–5.81)
RDW: 13.8 % (ref 11.5–14.6)
WBC: 7 10*3/uL (ref 4.5–10.5)

## 2011-07-09 LAB — LIPID PANEL
Cholesterol: 120 mg/dL (ref 0–200)
Cholesterol: 125 mg/dL (ref 0–200)
HDL: 34.5 mg/dL — ABNORMAL LOW (ref >39.00)
LDL Cholesterol: 75 mg/dL (ref 0–99)
Total CHOL/HDL Ratio: 3
Triglycerides: 53 mg/dL (ref 0.0–149.0)
VLDL: 10.6 mg/dL (ref 0.0–40.0)
VLDL: 11.2 mg/dL (ref 0.0–40.0)

## 2011-07-25 ENCOUNTER — Other Ambulatory Visit: Payer: Self-pay | Admitting: Thoracic Surgery (Cardiothoracic Vascular Surgery)

## 2011-07-25 ENCOUNTER — Other Ambulatory Visit: Payer: Self-pay | Admitting: *Deleted

## 2011-07-25 DIAGNOSIS — I2510 Atherosclerotic heart disease of native coronary artery without angina pectoris: Secondary | ICD-10-CM

## 2011-07-25 DIAGNOSIS — E785 Hyperlipidemia, unspecified: Secondary | ICD-10-CM

## 2011-07-26 ENCOUNTER — Other Ambulatory Visit: Payer: Self-pay | Admitting: Cardiology

## 2011-07-31 ENCOUNTER — Other Ambulatory Visit: Payer: Self-pay | Admitting: Internal Medicine

## 2011-09-26 ENCOUNTER — Encounter: Payer: Self-pay | Admitting: Internal Medicine

## 2011-09-26 ENCOUNTER — Ambulatory Visit (INDEPENDENT_AMBULATORY_CARE_PROVIDER_SITE_OTHER): Payer: Medicare Other | Admitting: Internal Medicine

## 2011-09-26 DIAGNOSIS — Z951 Presence of aortocoronary bypass graft: Secondary | ICD-10-CM | POA: Diagnosis not present

## 2011-09-26 DIAGNOSIS — I472 Ventricular tachycardia: Secondary | ICD-10-CM | POA: Diagnosis not present

## 2011-09-26 DIAGNOSIS — I469 Cardiac arrest, cause unspecified: Secondary | ICD-10-CM

## 2011-09-26 DIAGNOSIS — F17200 Nicotine dependence, unspecified, uncomplicated: Secondary | ICD-10-CM | POA: Diagnosis not present

## 2011-09-26 LAB — ICD DEVICE OBSERVATION
ATRIAL PACING ICD: 54.66 pct
BAMS-0001: 170 {beats}/min
DEV-0020ICD: NEGATIVE
FVT: 0
RV LEAD THRESHOLD: 1 V
TZAT-0018SLOWVT: NEGATIVE
TZAT-0019SLOWVT: 8 V
TZAT-0020FASTVT: 1.5 ms
TZAT-0020SLOWVT: 1.5 ms
TZON-0003SLOWVT: 360 ms
TZON-0003VSLOWVT: 380 ms
TZON-0004VSLOWVT: 20
TZST-0001FASTVT: 2
TZST-0001FASTVT: 3
TZST-0001SLOWVT: 3
TZST-0002FASTVT: NEGATIVE
TZST-0002FASTVT: NEGATIVE
VENTRICULAR PACING ICD: 0 pct
VF: 0

## 2011-09-26 NOTE — Assessment & Plan Note (Signed)
S/p ICD for secondary prevention Normal ICD function See Pace Art report No changes today

## 2011-09-26 NOTE — Assessment & Plan Note (Signed)
>  3 minutes spent discussion tobacco cessation today He states that he will quit before he returns to my office

## 2011-09-26 NOTE — Progress Notes (Signed)
PCP:  Acey Lav, MD, MD Primary Cardiologist:  [name]  The patient presents today for routine electrophysiology followup.  Since last being seen in our clinic, the patient reports doing very well.  Today, he denies symptoms of palpitations, chest pain, shortness of breath, orthopnea, PND, lower extremity edema, dizziness, presyncope, syncope, or neurologic sequela.  The patient feels that he is tolerating medications without difficulties and is otherwise without complaint today.   Past Medical History  Diagnosis Date  . CAD (coronary artery disease)     Est. EF of 45% -- Successful intracoronary stenting of the mid-LAD -- Three-vessel atherosclerotic coronary artery disease.  The patient has occlusion of the mid LAD, which is his culprit lesion.  He has  moderate disease in the proximal circumflex and PDA -- Moderate left ventricular dysfunction -- [name], [name]  . Ischemic cardiomyopathy     severe ischemic cardiomyopathy -- ejection fraction 30-35%  . Polymorphic ventricular tachycardia     with VF arrest, s/p subsequent ICD  . Ejection fraction < 50%     30-35%  . HL (hearing loss)   . CHF NYHA class II      New York Heart Association class II/III heart failure  . Adrenal mass     currently being evaluated -- 4.1 cm right adrenal mass  . Acute anterior myocardial infarction 09/02/2008    post emergent stenting of the LAD in January 2010 -- anterior ST-elevation myocardial infarction  . Hyperlipidemia   . Hypertension   . LV dysfunction     known severe LV dysfunction/severe ischemic cardiomyopathy  . Cardiac arrest - ventricular fibrillation 09/02/2008    Out of hospital cardiac arrest secondary to arrhythmia  . History of methicillin resistant staphylococcus aureus (MRSA)     History of methicillin-sensitive Staphylococcus aureus bacteremia  with implantable cardioverter-defibrillator explant, February 23, 2009  . CHF (congestive heart failure)   . Pericarditis    history of acute pericarditis in July 2010 now resolved  . Anemia   . S/P CABG (coronary artery bypass graft) Sept 2012   Past Surgical History  Procedure Date  . Ankle surgery   . Icd implant 02/23/2009     with subsequent extraction for MSSA bacteremia  . Icd reimplantation 05/09/2009    status post implant of a Medtronic Maximo II DR dual- chamber cardioverter defibrillator by Dr. Hillis Range  . Cardiac catheterization 08/26/2008    Est. EF of 45% -- Successful intracoronary stenting of the mid-LAD -- Three-vessel atherosclerotic coronary artery disease.  The patient has occlusion of the mid LAD, which is his culprit lesion.  He has  moderate disease in the proximal circumflex and PDA -- Moderate left ventricular dysfunction -- [name], [name]  . Cardiac catheterization 03/16/2009     EF was approximately 30% -- Atherosclerotic coronary vascular disease, three-vessel --  Moderate-to-severe ischemic cardiomyopathy -- No clear evidence for an acute culprit lesion that would explain the patient's chest pain -- [name], [name].   . Coronary artery bypass graft 05/07/2011    CABG x3:  LIMA to LAD, SVG to OM2, SVG to PDA    Current Outpatient Prescriptions  Medication Sig Dispense Refill  . aspirin EC 325 MG tablet Take 325 mg by mouth daily.        Marland Kitchen atorvastatin (LIPITOR) 80 MG tablet Take 80 mg by mouth daily.        . carvedilol (COREG) 25 MG tablet TAKE 1 TABLET BY MOUTH  TWICE DAILY WITH MEALS  60 tablet  5  . ramipril (ALTACE) 10 MG capsule TAKE ONE CAPSULE BY MOUTH DAILY  30 capsule  2    Allergies  Allergen Reactions  . Morphine And Related     History   Social History  . Marital Status: Married    Spouse Name: N/A    Number of Children: 3  . Years of Education: N/A   Occupational History  . Curator farm equipment    Social History Main Topics  . Smoking status: Current Some Day Smoker -- 37 years    Types: Cigarettes  . Smokeless tobacco: Not on file    Comment: 1 pack per week. Has been smoking for 35 years.   . Alcohol Use: No  . Drug Use: No  . Sexually Active: Not on file   Other Topics Concern  . Not on file   Social History Narrative   SOCIAL HISTORY:  He is married for 37 years with children.  He smokes 1 pack per day. He has been smoking for 37 years.  He denies alcohol use.  He works as a  Curator.   FAMILY HISTORY:  Father has a history of coronary artery disease. Mother died in her 56s with a stroke.  He has 2 sisters who are alive and well.     Family History  Problem Relation Age of Onset  . Stroke Mother   . Coronary artery disease Father     Physical Exam: Filed Vitals:   09/26/11 1105  BP: 124/83  Pulse: 74  Height: 5\' 7"  (1.702 m)  Weight: 195 lb (88.451 kg)    GEN- The patient is well appearing, alert and oriented x 3 today.   Head- normocephalic, atraumatic Eyes-  Sclera clear, conjunctiva pink Ears- hearing intact Oropharynx- clear Neck- supple, no JVP Lymph- no cervical lymphadenopathy Lungs- Clear to ausculation bilaterally, normal work of breathing Chest- ICD pocket is well healed Heart- Regular rate and rhythm, no murmurs, rubs or gallops, PMI not laterally displaced GI- soft, NT, ND, + BS Extremities- no clubbing, cyanosis, or edema MS- no significant deformity or atrophy Skin- no rash or lesion Psych- euthymic mood, full affect Neuro- strength and sensation are intact  ICD interrogation- reviewed in detail today,  See PACEART report  Assessment and Plan:

## 2011-09-26 NOTE — Patient Instructions (Signed)
Your physician recommends that you schedule a follow-up appointment in: 3 months with device clinic and 12 months with Dr Allred.   

## 2011-09-26 NOTE — Assessment & Plan Note (Signed)
Doing well s/p recent cabg No ischemic symptoms No changes today

## 2011-10-01 ENCOUNTER — Ambulatory Visit (INDEPENDENT_AMBULATORY_CARE_PROVIDER_SITE_OTHER): Payer: Medicare Other | Admitting: Cardiology

## 2011-10-01 ENCOUNTER — Encounter: Payer: Self-pay | Admitting: Cardiology

## 2011-10-01 VITALS — BP 116/64 | HR 74 | Ht 67.0 in | Wt 193.4 lb

## 2011-10-01 DIAGNOSIS — I5020 Unspecified systolic (congestive) heart failure: Secondary | ICD-10-CM

## 2011-10-01 DIAGNOSIS — E785 Hyperlipidemia, unspecified: Secondary | ICD-10-CM

## 2011-10-01 DIAGNOSIS — I509 Heart failure, unspecified: Secondary | ICD-10-CM | POA: Diagnosis not present

## 2011-10-01 DIAGNOSIS — I472 Ventricular tachycardia: Secondary | ICD-10-CM

## 2011-10-01 DIAGNOSIS — I2510 Atherosclerotic heart disease of native coronary artery without angina pectoris: Secondary | ICD-10-CM | POA: Diagnosis not present

## 2011-10-01 DIAGNOSIS — E7800 Pure hypercholesterolemia, unspecified: Secondary | ICD-10-CM

## 2011-10-01 DIAGNOSIS — F17200 Nicotine dependence, unspecified, uncomplicated: Secondary | ICD-10-CM | POA: Diagnosis not present

## 2011-10-01 DIAGNOSIS — Z720 Tobacco use: Secondary | ICD-10-CM

## 2011-10-01 MED ORDER — SPIRONOLACTONE 25 MG PO TABS: 25.0000 mg | ORAL_TABLET | Freq: Every day | ORAL | Status: DC

## 2011-10-01 NOTE — Assessment & Plan Note (Signed)
He is currently on Lipitor. We will repeat fasting lipid panel on his next visit in 3 months.

## 2011-10-01 NOTE — Assessment & Plan Note (Signed)
He is status post ICD implant with no recurrent episodes of tachycardia.

## 2011-10-01 NOTE — Progress Notes (Signed)
[name] Date of Birth: 1956/05/29   History of Present Illness: [name] is seen back today for a followup visit. He is status post CABG in September. He states he feels great. He has had no significant chest pain, shortness of breath, or palpitations. He is walking 1 mile per day. Initially after his bypass surgery he had a persistent cough but this has resolved. He is also now being followed by Dr. Evlyn Kanner for his adrenal tumor. He continues to smoke one pack per day. He does report that his wife has been diagnosed and is currently under treatment for stage IV lung cancer.  Current Outpatient Prescriptions on File Prior to Visit  Medication Sig Dispense Refill  . aspirin EC 325 MG tablet Take 325 mg by mouth daily.        Marland Kitchen atorvastatin (LIPITOR) 80 MG tablet Take 80 mg by mouth daily.        . carvedilol (COREG) 25 MG tablet TAKE 1 TABLET BY MOUTH TWICE DAILY WITH MEALS  60 tablet  5  . ramipril (ALTACE) 10 MG capsule TAKE ONE CAPSULE BY MOUTH DAILY  30 capsule  2    Allergies  Allergen Reactions  . Morphine And Related     Past Medical History  Diagnosis Date  . CAD (coronary artery disease)     Est. EF of 45% -- Successful intracoronary stenting of the mid-LAD -- Three-vessel atherosclerotic coronary artery disease.  The patient has occlusion of the mid LAD, which is his culprit lesion.  He has  moderate disease in the proximal circumflex and PDA -- Moderate left ventricular dysfunction -- [name], [name]  . Ischemic cardiomyopathy     severe ischemic cardiomyopathy -- ejection fraction 30-35%  . Polymorphic ventricular tachycardia     with VF arrest, s/p subsequent ICD  . Ejection fraction < 50%     30-35%  . HL (hearing loss)   . CHF NYHA class II      New York Heart Association class II/III heart failure  . Adrenal mass     currently being evaluated -- 4.1 cm right adrenal mass  . Acute anterior myocardial infarction 09/02/2008    post emergent stenting  of the LAD in January 2010 -- anterior ST-elevation myocardial infarction  . Hyperlipidemia   . Hypertension   . Cardiac arrest - ventricular fibrillation 09/02/2008    Out of hospital cardiac arrest secondary to arrhythmia  . History of methicillin resistant staphylococcus aureus (MRSA)     History of methicillin-sensitive Staphylococcus aureus bacteremia  with implantable cardioverter-defibrillator explant, February 23, 2009  . CHF (congestive heart failure)   . Pericarditis     history of acute pericarditis in July 2010 now resolved  . Anemia   . S/P CABG (coronary artery bypass graft) Sept 2012    Past Surgical History  Procedure Date  . Ankle surgery   . Icd implant 02/23/2009     with subsequent extraction for MSSA bacteremia  . Icd reimplantation 05/09/2009    status post implant of a Medtronic Maximo II DR dual- chamber cardioverter defibrillator by Dr. Hillis Range  . Cardiac catheterization 08/26/2008    Est. EF of 45% -- Successful intracoronary stenting of the mid-LAD -- Three-vessel atherosclerotic coronary artery disease.  The patient has occlusion of the mid LAD, which is his culprit lesion.  He has  moderate disease in the proximal circumflex and PDA -- Moderate left ventricular dysfunction -- [name], [name]  .  Cardiac catheterization 03/16/2009     EF was approximately 30% -- Atherosclerotic coronary vascular disease, three-vessel --  Moderate-to-severe ischemic cardiomyopathy -- No clear evidence for an acute culprit lesion that would explain the patient's chest pain -- [name], [name].   . Coronary artery bypass graft 05/07/2011    CABG x3:  LIMA to LAD, SVG to OM2, SVG to PDA    History  Smoking status  . Current Some Day Smoker -- 37 years  . Types: Cigarettes  Smokeless tobacco  . Not on file  Comment: 1 pack per week. Has been smoking for 35 years.     History  Alcohol Use No    Family History  Problem Relation Age of Onset  . Stroke Mother     . Coronary artery disease Father     Review of Systems: The review of systems is per the HPI.  All other systems were reviewed and are negative.  Physical Exam: BP 116/64  Pulse 74  Ht 5\' 7"  (1.702 m)  Wt 193 lb 6.4 oz (87.726 kg)  BMI 30.29 kg/m2 Patient is very pleasant and in no acute distress. Skin is warm and dry. Color is normal.  HEENT is unremarkable. Normocephalic/atraumatic. PERRL. Sclera are nonicteric. Neck is supple. No masses. No JVD. Lungs are clear. Cardiac exam shows a regular rate and rhythm. Sternum looks good. Abdomen is soft. Extremities are without edema. Gait and ROM are intact. No gross neurologic deficits noted.   LABORATORY DATA: EKG shows atrial pacing.    Assessment / Plan:

## 2011-10-01 NOTE — Assessment & Plan Note (Signed)
We discussed the importance of smoking cessation. We discussed options for support. He is going to try using the electronic cigarette.

## 2011-10-01 NOTE — Assessment & Plan Note (Signed)
He is on optimal therapy with Altace and carvedilol. We will add Aldactone 25 mg daily.

## 2011-10-01 NOTE — Assessment & Plan Note (Signed)
He continues to make excellent progress post bypass surgery. He has no active anginal symptoms. He is on appropriate medications.

## 2011-10-01 NOTE — Patient Instructions (Signed)
Continue your current medications and exercise.  Stop smoking.  We will add aldactone 25 mg daily.  We will schedule you for an Echocardiogram  I will see you again in 3 months with fasting lab work.

## 2011-10-02 DIAGNOSIS — E785 Hyperlipidemia, unspecified: Secondary | ICD-10-CM | POA: Diagnosis not present

## 2011-10-02 DIAGNOSIS — I2510 Atherosclerotic heart disease of native coronary artery without angina pectoris: Secondary | ICD-10-CM | POA: Diagnosis not present

## 2011-10-02 DIAGNOSIS — I10 Essential (primary) hypertension: Secondary | ICD-10-CM | POA: Diagnosis not present

## 2011-10-02 DIAGNOSIS — E278 Other specified disorders of adrenal gland: Secondary | ICD-10-CM | POA: Diagnosis not present

## 2011-10-04 ENCOUNTER — Ambulatory Visit (HOSPITAL_COMMUNITY): Payer: Medicare Other | Attending: Cardiology | Admitting: Radiology

## 2011-10-04 DIAGNOSIS — I10 Essential (primary) hypertension: Secondary | ICD-10-CM | POA: Insufficient documentation

## 2011-10-04 DIAGNOSIS — I2510 Atherosclerotic heart disease of native coronary artery without angina pectoris: Secondary | ICD-10-CM | POA: Insufficient documentation

## 2011-10-04 DIAGNOSIS — E785 Hyperlipidemia, unspecified: Secondary | ICD-10-CM | POA: Diagnosis not present

## 2011-10-04 DIAGNOSIS — Z720 Tobacco use: Secondary | ICD-10-CM

## 2011-10-04 DIAGNOSIS — I509 Heart failure, unspecified: Secondary | ICD-10-CM

## 2011-10-07 ENCOUNTER — Other Ambulatory Visit: Payer: Self-pay | Admitting: Cardiology

## 2011-10-07 MED ORDER — RAMIPRIL 10 MG PO CAPS: 10.0000 mg | ORAL_CAPSULE | Freq: Every day | ORAL | Status: DC

## 2011-10-07 NOTE — Telephone Encounter (Signed)
Refilled ramipril. 

## 2011-10-09 ENCOUNTER — Telehealth: Payer: Self-pay | Admitting: Cardiology

## 2011-10-09 NOTE — Telephone Encounter (Signed)
I spoke with the patient. He is aware of his echo results.  

## 2011-10-09 NOTE — Telephone Encounter (Signed)
FU Call: pt returning call from our office regarding pt test results. Please return pt call to discuss further.  

## 2011-12-25 ENCOUNTER — Ambulatory Visit (INDEPENDENT_AMBULATORY_CARE_PROVIDER_SITE_OTHER): Payer: Medicare Other | Admitting: *Deleted

## 2011-12-25 ENCOUNTER — Encounter: Payer: Self-pay | Admitting: Internal Medicine

## 2011-12-25 DIAGNOSIS — I472 Ventricular tachycardia: Secondary | ICD-10-CM

## 2011-12-25 LAB — ICD DEVICE OBSERVATION
AL AMPLITUDE: 1.875 mv
BAMS-0001: 170 {beats}/min
BATTERY VOLTAGE: 3.1099 V
FVT: 0
PACEART VT: 0
RV LEAD AMPLITUDE: 21.125 mv
RV LEAD IMPEDENCE ICD: 475 Ohm
TZAT-0002SLOWVT: NEGATIVE
TZAT-0018SLOWVT: NEGATIVE
TZAT-0019FASTVT: 8 V
TZAT-0019SLOWVT: 8 V
TZAT-0020FASTVT: 1.5 ms
TZON-0003SLOWVT: 360 ms
TZON-0005SLOWVT: 12
TZST-0001FASTVT: 2
TZST-0001FASTVT: 3
TZST-0001FASTVT: 5
TZST-0001SLOWVT: 5
TZST-0001SLOWVT: 6
TZST-0002FASTVT: NEGATIVE
TZST-0002FASTVT: NEGATIVE
TZST-0002SLOWVT: NEGATIVE
TZST-0002SLOWVT: NEGATIVE
TZST-0002SLOWVT: NEGATIVE
VF: 0

## 2011-12-25 NOTE — Progress Notes (Signed)
ICD check 

## 2011-12-27 ENCOUNTER — Ambulatory Visit (INDEPENDENT_AMBULATORY_CARE_PROVIDER_SITE_OTHER): Payer: Medicare Other | Admitting: Cardiology

## 2011-12-27 ENCOUNTER — Other Ambulatory Visit: Payer: Medicare Other

## 2011-12-27 ENCOUNTER — Encounter: Payer: Self-pay | Admitting: Cardiology

## 2011-12-27 ENCOUNTER — Telehealth: Payer: Self-pay | Admitting: Cardiology

## 2011-12-27 VITALS — BP 115/82 | HR 67 | Ht 67.0 in | Wt 185.0 lb

## 2011-12-27 DIAGNOSIS — I509 Heart failure, unspecified: Secondary | ICD-10-CM | POA: Diagnosis not present

## 2011-12-27 DIAGNOSIS — I2510 Atherosclerotic heart disease of native coronary artery without angina pectoris: Secondary | ICD-10-CM

## 2011-12-27 DIAGNOSIS — E785 Hyperlipidemia, unspecified: Secondary | ICD-10-CM

## 2011-12-27 DIAGNOSIS — F17200 Nicotine dependence, unspecified, uncomplicated: Secondary | ICD-10-CM

## 2011-12-27 DIAGNOSIS — E7800 Pure hypercholesterolemia, unspecified: Secondary | ICD-10-CM

## 2011-12-27 DIAGNOSIS — Z720 Tobacco use: Secondary | ICD-10-CM

## 2011-12-27 DIAGNOSIS — I472 Ventricular tachycardia: Secondary | ICD-10-CM

## 2011-12-27 DIAGNOSIS — I5020 Unspecified systolic (congestive) heart failure: Secondary | ICD-10-CM

## 2011-12-27 LAB — HEPATIC FUNCTION PANEL
AST: 17 U/L (ref 0–37)
Albumin: 4.1 g/dL (ref 3.5–5.2)
Alkaline Phosphatase: 105 U/L (ref 39–117)
Total Bilirubin: 0.5 mg/dL (ref 0.3–1.2)

## 2011-12-27 LAB — LIPID PANEL
Cholesterol: 106 mg/dL (ref 0–200)
HDL: 30.1 mg/dL — ABNORMAL LOW (ref >39.00)
LDL Cholesterol: 63 mg/dL (ref 0–99)
VLDL: 13.4 mg/dL (ref 0.0–40.0)

## 2011-12-27 LAB — BASIC METABOLIC PANEL
BUN: 18 mg/dL (ref 6–23)
Calcium: 9.1 mg/dL (ref 8.4–10.5)
GFR: 110.17 mL/min (ref >60.00)
Glucose, Bld: 98 mg/dL (ref 70–99)
Potassium: 4.5 mEq/L (ref 3.5–5.1)
Sodium: 139 mEq/L (ref 135–145)

## 2011-12-27 NOTE — Assessment & Plan Note (Signed)
No recurrence since his myocardial infarction. He has an ICD in place.

## 2011-12-27 NOTE — Progress Notes (Signed)
[name] Date of Birth: [birth year]   History of Present Illness: Mr. Mcelhannon is seen back today for a followup visit. He is status post CABG in September 2012. He continues to do very well. He he reports he quit smoking 2 weeks ago. He made a promise to his wife who is recovering from lung cancer that he would quit. He is trying to quit cold [place]. He continues to walk one half miles per day. He has lost 8 pounds. He denies any chest pain, shortness of breath, or palpitations. He's had no orthopnea or edema. He feels excellent. He had his ICD checked 2 days ago and apparently this checked out well.  Current Outpatient Prescriptions on File Prior to Visit  Medication Sig Dispense Refill  . aspirin EC 325 MG tablet Take 325 mg by mouth daily.        Marland Kitchen atorvastatin (LIPITOR) 80 MG tablet Take 80 mg by mouth daily.        . carvedilol (COREG) 25 MG tablet TAKE 1 TABLET BY MOUTH TWICE DAILY WITH MEALS  60 tablet  5  . ramipril (ALTACE) 10 MG capsule Take 1 capsule (10 mg total) by mouth daily.  30 capsule  2  . spironolactone (ALDACTONE) 25 MG tablet Take 1 tablet (25 mg total) by mouth daily.  30 tablet  11    Allergies  Allergen Reactions  . Morphine And Related     Past Medical History  Diagnosis Date  . CAD (coronary artery disease)     Est. EF of 45% -- Successful intracoronary stenting of the mid-LAD -- Three-vessel atherosclerotic coronary artery disease.  The patient has occlusion of the mid LAD, which is his culprit lesion.  He has  moderate disease in the proximal circumflex and PDA -- Moderate left ventricular dysfunction -- Jazzmen Restivo M. [place], [name]  . Ischemic cardiomyopathy     severe ischemic cardiomyopathy -- ejection fraction 30-35%  . Polymorphic ventricular tachycardia     with VF arrest, s/p subsequent ICD  . Ejection fraction < 50%     30-35%  . HL (hearing loss)   . CHF NYHA class II      New York Heart Association class II/III heart failure  . Adrenal mass      currently being evaluated -- 4.1 cm right adrenal mass  . Acute anterior myocardial infarction 09/02/2008    post emergent stenting of the LAD in January 2010 -- anterior ST-elevation myocardial infarction  . Hyperlipidemia   . Hypertension   . Cardiac arrest - ventricular fibrillation 09/02/2008    Out of hospital cardiac arrest secondary to arrhythmia  . History of methicillin resistant staphylococcus aureus (MRSA)     History of methicillin-sensitive Staphylococcus aureus bacteremia  with implantable cardioverter-defibrillator explant, February 23, 2009  . CHF (congestive heart failure)   . Pericarditis     history of acute pericarditis in July 2010 now resolved  . Anemia   . S/P CABG (coronary artery bypass graft) Sept 2012    Past Surgical History  Procedure Date  . Ankle surgery   . Icd implant 02/23/2009     with subsequent extraction for MSSA bacteremia  . Icd reimplantation 05/09/2009    status post implant of a Medtronic Maximo II DR dual- chamber cardioverter defibrillator by Dr. Hillis Range  . Cardiac catheterization 08/26/2008    Est. EF of 45% -- Successful intracoronary stenting of the mid-LAD -- Three-vessel atherosclerotic coronary artery disease.  The patient  has occlusion of the mid LAD, which is his culprit lesion.  He has  moderate disease in the proximal circumflex and PDA -- Moderate left ventricular dysfunction -- Abdirahim Flavell M. [place], [name]  . Cardiac catheterization 03/16/2009     EF was approximately 30% -- Atherosclerotic coronary vascular disease, three-vessel --  Moderate-to-severe ischemic cardiomyopathy -- No clear evidence for an acute culprit lesion that would explain the patient's chest pain -- [name], [name].   . Coronary artery bypass graft 05/07/2011    CABG x3:  LIMA to LAD, SVG to OM2, SVG to PDA    History  Smoking status  . Former Smoker -- 37 years  . Types: Cigarettes  . Quit date: 12/18/2011  Smokeless tobacco  . Not on file  Comment:  1 pack per week. Has been smoking for 35 years.     History  Alcohol Use No    Family History  Problem Relation Age of Onset  . Stroke Mother   . Coronary artery disease Father     Review of Systems: The review of systems is per the HPI.  All other systems were reviewed and are negative.  Physical Exam: BP 115/82  Pulse 67  Ht 5\' 7"  (1.702 m)  Wt 185 lb (83.915 kg)  BMI 28.97 kg/m2 Patient is very pleasant and in no acute distress. Skin is warm and dry. Color is normal.  HEENT is unremarkable. Normocephalic/atraumatic. PERRL. Sclera are nonicteric. Neck is supple. No masses. No JVD. Lungs are clear. Cardiac exam shows a regular rate and rhythm. Sternum looks good. Abdomen is soft. Extremities are without edema. Gait and ROM are intact. No gross neurologic deficits noted.   LABORATORY DATA:    Assessment / Plan:

## 2011-12-27 NOTE — Assessment & Plan Note (Signed)
Ejection fraction of 35%. He has class I symptoms. He is well compensated on combination of carvedilol, ACE inhibitor, and Aldactone. Continue sodium restriction.

## 2011-12-27 NOTE — Assessment & Plan Note (Signed)
A. status post anterior myocardial infarction with emergent stenting of the LAD. He had reocclusion with cardiac arrest. He underwent coronary bypass surgery in September and has made an excellent recovery. He is asymptomatic. We will continue with efforts at risk factor modification. Echocardiogram in February of this year showed ejection fraction of 35% which is slightly improved from his preoperative evaluation. I will followup again in 6 months.

## 2011-12-27 NOTE — Telephone Encounter (Signed)
Fu call °Patient returning your call °

## 2011-12-27 NOTE — Assessment & Plan Note (Signed)
Have congratulated him on his efforts at smoking cessation.

## 2011-12-27 NOTE — Telephone Encounter (Signed)
Patient called lab results given. 

## 2011-12-27 NOTE — Assessment & Plan Note (Signed)
We will followup fasting lab work today including chemistries and lipid panel. Continue Lipitor 80 mg daily.

## 2011-12-27 NOTE — Patient Instructions (Signed)
Continue your current medication.  We will call with the results of your lab work today.  Congratulations on quitting smoking.  I will see you again in 6 months.

## 2012-01-17 ENCOUNTER — Other Ambulatory Visit (HOSPITAL_COMMUNITY): Payer: Self-pay | Admitting: *Deleted

## 2012-01-17 ENCOUNTER — Other Ambulatory Visit: Payer: Self-pay | Admitting: Cardiovascular Disease

## 2012-01-17 MED ORDER — RAMIPRIL 10 MG PO CAPS: 10.0000 mg | ORAL_CAPSULE | Freq: Every day | ORAL | Status: DC

## 2012-01-17 NOTE — Telephone Encounter (Signed)
Refilled ramipril. 

## 2012-02-14 ENCOUNTER — Other Ambulatory Visit: Payer: Self-pay | Admitting: *Deleted

## 2012-02-14 ENCOUNTER — Other Ambulatory Visit: Payer: Self-pay | Admitting: Cardiovascular Disease

## 2012-02-14 DIAGNOSIS — E785 Hyperlipidemia, unspecified: Secondary | ICD-10-CM

## 2012-02-18 ENCOUNTER — Other Ambulatory Visit: Payer: Self-pay | Admitting: Cardiology

## 2012-03-30 ENCOUNTER — Encounter: Payer: Self-pay | Admitting: Internal Medicine

## 2012-03-30 ENCOUNTER — Ambulatory Visit (INDEPENDENT_AMBULATORY_CARE_PROVIDER_SITE_OTHER): Payer: Medicare Other | Admitting: *Deleted

## 2012-03-30 DIAGNOSIS — I472 Ventricular tachycardia: Secondary | ICD-10-CM | POA: Diagnosis not present

## 2012-03-30 DIAGNOSIS — I469 Cardiac arrest, cause unspecified: Secondary | ICD-10-CM | POA: Diagnosis not present

## 2012-03-30 LAB — ICD DEVICE OBSERVATION
ATRIAL PACING ICD: 73.25 pct
BAMS-0001: 170 {beats}/min
DEV-0020ICD: NEGATIVE
FVT: 0
PACEART VT: 0
RV LEAD IMPEDENCE ICD: 532 Ohm
TOT-0001: 1
TZAT-0001FASTVT: 1
TZAT-0012SLOWVT: 200 ms
TZAT-0019SLOWVT: 8 V
TZAT-0020SLOWVT: 1.5 ms
TZON-0003SLOWVT: 360 ms
TZON-0003VSLOWVT: 380 ms
TZON-0004SLOWVT: 16
TZST-0001FASTVT: 2
TZST-0001FASTVT: 4
TZST-0001FASTVT: 6
TZST-0001SLOWVT: 2
TZST-0002FASTVT: NEGATIVE
TZST-0002FASTVT: NEGATIVE
TZST-0002SLOWVT: NEGATIVE
TZST-0002SLOWVT: NEGATIVE
TZST-0002SLOWVT: NEGATIVE
VENTRICULAR PACING ICD: 0 pct

## 2012-03-30 NOTE — Progress Notes (Signed)
ICD check 

## 2012-04-06 ENCOUNTER — Other Ambulatory Visit (HOSPITAL_COMMUNITY): Payer: Self-pay | Admitting: Endocrinology

## 2012-04-06 ENCOUNTER — Ambulatory Visit (HOSPITAL_COMMUNITY)
Admission: RE | Admit: 2012-04-06 | Discharge: 2012-04-06 | Disposition: A | Discharge status: Home / Self Care | Payer: Medicare Other | Source: Ambulatory Visit | Attending: Endocrinology | Admitting: Endocrinology

## 2012-04-06 DIAGNOSIS — I2510 Atherosclerotic heart disease of native coronary artery without angina pectoris: Secondary | ICD-10-CM | POA: Diagnosis not present

## 2012-04-06 DIAGNOSIS — E278 Other specified disorders of adrenal gland: Secondary | ICD-10-CM

## 2012-04-06 DIAGNOSIS — I509 Heart failure, unspecified: Secondary | ICD-10-CM | POA: Diagnosis not present

## 2012-04-06 DIAGNOSIS — K8020 Calculus of gallbladder without cholecystitis without obstruction: Secondary | ICD-10-CM | POA: Insufficient documentation

## 2012-04-06 DIAGNOSIS — E279 Disorder of adrenal gland, unspecified: Secondary | ICD-10-CM | POA: Diagnosis not present

## 2012-04-06 DIAGNOSIS — E785 Hyperlipidemia, unspecified: Secondary | ICD-10-CM | POA: Diagnosis not present

## 2012-06-05 ENCOUNTER — Ambulatory Visit (INDEPENDENT_AMBULATORY_CARE_PROVIDER_SITE_OTHER): Payer: Medicare Other | Admitting: Cardiology

## 2012-06-05 ENCOUNTER — Encounter: Payer: Self-pay | Admitting: Cardiology

## 2012-06-05 VITALS — BP 116/70 | HR 73 | Ht 67.0 in | Wt 181.1 lb

## 2012-06-05 DIAGNOSIS — I10 Essential (primary) hypertension: Secondary | ICD-10-CM

## 2012-06-05 DIAGNOSIS — Z951 Presence of aortocoronary bypass graft: Secondary | ICD-10-CM

## 2012-06-05 DIAGNOSIS — I5020 Unspecified systolic (congestive) heart failure: Secondary | ICD-10-CM

## 2012-06-05 DIAGNOSIS — I2510 Atherosclerotic heart disease of native coronary artery without angina pectoris: Secondary | ICD-10-CM | POA: Diagnosis not present

## 2012-06-05 DIAGNOSIS — F17200 Nicotine dependence, unspecified, uncomplicated: Secondary | ICD-10-CM

## 2012-06-05 NOTE — Patient Instructions (Signed)
Continue your current therapy.  Stop smoking completely.  I will see you again in 6 months with fasting lab work

## 2012-06-05 NOTE — Progress Notes (Signed)
[name] Date of Birth: 09-Mar-1956   History of Present Illness: Mr. Slaght is seen back today for a followup visit. He is status post CABG in September 2012. He has a history of cardiac arrest and is status post ICD implant. He continues to smoke 6 cigarettes per day. He is exercising regularly. He feels excellent. He denies any chest pain, palpitations, or dyspnea.  Current Outpatient Prescriptions on File Prior to Visit  Medication Sig Dispense Refill  . aspirin EC 325 MG tablet Take 325 mg by mouth daily.        Marland Kitchen atorvastatin (LIPITOR) 80 MG tablet TAKE 1 TABLET BY MOUTH EVERY NIGHT AT BEDTIME  30 tablet  6  . carvedilol (COREG) 25 MG tablet TAKE 1 TABLET BY MOUTH TWICE DAILY WITH MEALS  60 tablet  5  . ramipril (ALTACE) 10 MG capsule Take 1 capsule (10 mg total) by mouth daily.  30 capsule  5  . spironolactone (ALDACTONE) 25 MG tablet Take 1 tablet (25 mg total) by mouth daily.  30 tablet  11    Allergies  Allergen Reactions  . Morphine And Related     Past Medical History  Diagnosis Date  . CAD (coronary artery disease)     Est. EF of 45% -- Successful intracoronary stenting of the mid-LAD -- Three-vessel atherosclerotic coronary artery disease.  The patient has occlusion of the mid LAD, which is his culprit lesion.  He has  moderate disease in the proximal circumflex and PDA -- Moderate left ventricular dysfunction -- [name], [name]  . Ischemic cardiomyopathy     severe ischemic cardiomyopathy -- ejection fraction 30-35%  . Polymorphic ventricular tachycardia     with VF arrest, s/p subsequent ICD  . Ejection fraction < 50%     30-35%  . HL (hearing loss)   . CHF NYHA class II      New York Heart Association class II/III heart failure  . Adrenal mass     currently being evaluated -- 4.1 cm right adrenal mass  . Acute anterior myocardial infarction 09/02/2008    post emergent stenting of the LAD in January 2010 -- anterior ST-elevation myocardial  infarction  . Hyperlipidemia   . Hypertension   . Cardiac arrest - ventricular fibrillation 09/02/2008    Out of hospital cardiac arrest secondary to arrhythmia  . History of methicillin resistant staphylococcus aureus (MRSA)     History of methicillin-sensitive Staphylococcus aureus bacteremia  with implantable cardioverter-defibrillator explant, February 23, 2009  . CHF (congestive heart failure)   . Pericarditis     history of acute pericarditis in July 2010 now resolved  . Anemia   . S/P CABG (coronary artery bypass graft) Sept 2012    Past Surgical History  Procedure Date  . Ankle surgery   . Icd implant 02/23/2009     with subsequent extraction for MSSA bacteremia  . Icd reimplantation 05/09/2009    status post implant of a Medtronic Maximo II DR dual- chamber cardioverter defibrillator by Dr. Hillis Range  . Cardiac catheterization 08/26/2008    Est. EF of 45% -- Successful intracoronary stenting of the mid-LAD -- Three-vessel atherosclerotic coronary artery disease.  The patient has occlusion of the mid LAD, which is his culprit lesion.  He has  moderate disease in the proximal circumflex and PDA -- Moderate left ventricular dysfunction -- [name], [name]  . Cardiac catheterization 03/16/2009     EF was approximately 30% -- Atherosclerotic  coronary vascular disease, three-vessel --  Moderate-to-severe ischemic cardiomyopathy -- No clear evidence for an acute culprit lesion that would explain the patient's chest pain -- [name], [name].   . Coronary artery bypass graft 05/07/2011    CABG x3:  LIMA to LAD, SVG to OM2, SVG to PDA    History  Smoking status  . Former Smoker -- 37 years  . Types: Cigarettes  . Quit date: 12/18/2011  Smokeless tobacco  . Not on file  Comment: 1 pack per week. Has been smoking for 35 years.     History  Alcohol Use No    Family History  Problem Relation Age of Onset  . Stroke Mother   . Coronary artery disease Father     Review  of Systems: The review of systems is per the HPI.  His adrenal mass is being followed by Dr. Evlyn Kanner. All other systems were reviewed and are negative.  Physical Exam: BP 116/70  Pulse 73  Ht 5\' 7"  (1.702 m)  Wt 82.155 kg (181 lb 1.9 oz)  BMI 28.37 kg/m2 Patient is very pleasant and in no acute distress. Skin is warm and dry. Color is normal.  HEENT is unremarkable. Normocephalic/atraumatic. PERRL. Sclera are nonicteric. Neck is supple. No masses. No JVD. Lungs are clear. Cardiac exam shows a regular rate and rhythm. Sternum looks good. Abdomen is soft. Extremities are without edema. Gait and ROM are intact. No gross neurologic deficits noted.   LABORATORY DATA:  ECG demonstrates atrial paced rhythm. There is an old anterior infarct.  Assessment / Plan: 1. Coronary disease status post anterior myocardial infarction. Status post CABG in September 2012. Patient is asymptomatic. We'll continue with aspirin, beta blocker, and statin therapy.  2. Ischemic cardiomyopathy ejection fraction 35%. He is on optimal medical therapy with carvedilol, Altace, and Aldactone. No evidence of volume overload. He has class I symptoms.  3. Tobacco abuse. Encourage smoking cessation.  4. History of cardiac arrest with polymorphic VT. Status post ICD implant.  5. Hyperlipidemia, well-controlled by last blood work.  6. Hypertension, controlled.

## 2012-06-24 ENCOUNTER — Encounter: Payer: Self-pay | Admitting: *Deleted

## 2012-06-24 DIAGNOSIS — Z95810 Presence of automatic (implantable) cardiac defibrillator: Secondary | ICD-10-CM | POA: Insufficient documentation

## 2012-07-09 ENCOUNTER — Ambulatory Visit (INDEPENDENT_AMBULATORY_CARE_PROVIDER_SITE_OTHER): Payer: Medicare Other | Admitting: *Deleted

## 2012-07-09 DIAGNOSIS — I469 Cardiac arrest, cause unspecified: Secondary | ICD-10-CM

## 2012-07-09 LAB — ICD DEVICE OBSERVATION
AL THRESHOLD: 1 V
BATTERY VOLTAGE: 3.0835 V
DEV-0020ICD: NEGATIVE
FVT: 0
PACEART VT: 0
RV LEAD THRESHOLD: 0.75 V
TZAT-0019FASTVT: 8 V
TZAT-0020FASTVT: 1.5 ms
TZAT-0020SLOWVT: 1.5 ms
TZON-0003VSLOWVT: 380 ms
TZON-0004SLOWVT: 16
TZON-0005SLOWVT: 12
TZST-0001FASTVT: 3
TZST-0001FASTVT: 5
TZST-0001FASTVT: 6
TZST-0001SLOWVT: 2
TZST-0001SLOWVT: 3
TZST-0001SLOWVT: 4
TZST-0002FASTVT: NEGATIVE
TZST-0002FASTVT: NEGATIVE
TZST-0002SLOWVT: NEGATIVE
VENTRICULAR PACING ICD: 0 pct
VF: 0

## 2012-07-09 NOTE — Patient Instructions (Addendum)
Return visit 10/16/12@9 :00am with Dr. Johney Frame.

## 2012-07-09 NOTE — Progress Notes (Signed)
ICD check 

## 2012-07-28 ENCOUNTER — Encounter: Payer: Self-pay | Admitting: Internal Medicine

## 2012-08-20 ENCOUNTER — Other Ambulatory Visit: Payer: Self-pay

## 2012-08-20 MED ORDER — CARVEDILOL 25 MG PO TABS: 25.0000 mg | ORAL_TABLET | Freq: Two times a day (BID) | ORAL | Status: DC

## 2012-08-27 ENCOUNTER — Other Ambulatory Visit: Payer: Self-pay

## 2012-08-27 MED ORDER — RAMIPRIL 10 MG PO CAPS: 10.0000 mg | ORAL_CAPSULE | Freq: Every day | ORAL | Status: DC

## 2012-09-25 ENCOUNTER — Other Ambulatory Visit: Payer: Self-pay | Admitting: Cardiovascular Disease

## 2012-10-15 ENCOUNTER — Other Ambulatory Visit: Payer: Self-pay

## 2012-10-15 MED ORDER — SPIRONOLACTONE 25 MG PO TABS: 25.0000 mg | ORAL_TABLET | Freq: Every day | ORAL | Status: DC

## 2012-10-16 ENCOUNTER — Encounter: Payer: Self-pay | Admitting: Internal Medicine

## 2012-10-16 ENCOUNTER — Ambulatory Visit (INDEPENDENT_AMBULATORY_CARE_PROVIDER_SITE_OTHER): Payer: Medicare Other | Admitting: Internal Medicine

## 2012-10-16 VITALS — BP 162/92 | HR 84 | Wt 187.1 lb

## 2012-10-16 DIAGNOSIS — F17200 Nicotine dependence, unspecified, uncomplicated: Secondary | ICD-10-CM

## 2012-10-16 DIAGNOSIS — I10 Essential (primary) hypertension: Secondary | ICD-10-CM

## 2012-10-16 DIAGNOSIS — I472 Ventricular tachycardia: Secondary | ICD-10-CM

## 2012-10-16 DIAGNOSIS — I5020 Unspecified systolic (congestive) heart failure: Secondary | ICD-10-CM

## 2012-10-16 LAB — ICD DEVICE OBSERVATION
AL AMPLITUDE: 2.4 mv
AL IMPEDENCE ICD: 494 Ohm
AL THRESHOLD: 1.25 V
BATTERY VOLTAGE: 3.0759 V
RV LEAD AMPLITUDE: 20 mv
RV LEAD IMPEDENCE ICD: 551 Ohm
RV LEAD THRESHOLD: 0.75 V
TOT-0006: 20100914000000
TZAT-0002SLOWVT: NEGATIVE
TZAT-0012FASTVT: 200 ms
TZAT-0019FASTVT: 8 V
TZAT-0019SLOWVT: 8 V
TZAT-0020FASTVT: 1.5 ms
TZST-0001FASTVT: 5
TZST-0001SLOWVT: 3
TZST-0001SLOWVT: 4
TZST-0001SLOWVT: 5
TZST-0002FASTVT: NEGATIVE
TZST-0002FASTVT: NEGATIVE
TZST-0002SLOWVT: NEGATIVE
TZST-0002SLOWVT: NEGATIVE
TZST-0002SLOWVT: NEGATIVE
VF: 0

## 2012-10-16 MED ORDER — RAMIPRIL 10 MG PO CAPS: 10.0000 mg | ORAL_CAPSULE | Freq: Every day | ORAL | Status: DC

## 2012-10-16 MED ORDER — CARVEDILOL 25 MG PO TABS: 25.0000 mg | ORAL_TABLET | Freq: Two times a day (BID) | ORAL | Status: DC

## 2012-10-16 NOTE — Assessment & Plan Note (Signed)
Above goal Restart lisinopril

## 2012-10-16 NOTE — Assessment & Plan Note (Signed)
Cessation again advised  He is not ready to quit

## 2012-10-16 NOTE — Assessment & Plan Note (Signed)
Stable No change required today  

## 2012-10-16 NOTE — Patient Instructions (Addendum)
Your physician recommends that you schedule a follow-up appointment in: 3 months in device clinic and 12 months with Dr Johney Frame  Restart Ramipril 10mg  daily

## 2012-10-16 NOTE — Assessment & Plan Note (Signed)
No recent arrhythmias Normal ICD function See Pace Art report No changes today

## 2012-10-16 NOTE — Progress Notes (Signed)
PCP:  Acey Lav, MD Primary Cardiologist:  [name]  The patient presents today for routine electrophysiology followup.  Since last being seen in our clinic, the patient reports doing very well.   He has been out of lisinopril for several weeks and his BP is increased. Today, he denies symptoms of palpitations, chest pain, shortness of breath, orthopnea, PND, lower extremity edema, dizziness, presyncope, syncope, or neurologic sequela.  The patient feels that he is tolerating medications without difficulties and is otherwise without complaint today.   Past Medical History  Diagnosis Date  . CAD (coronary artery disease)     Est. EF of 45% -- Successful intracoronary stenting of the mid-LAD -- Three-vessel atherosclerotic coronary artery disease.  The patient has occlusion of the mid LAD, which is his culprit lesion.  He has  moderate disease in the proximal circumflex and PDA -- Moderate left ventricular dysfunction -- [name], [name]  . Ischemic cardiomyopathy     severe ischemic cardiomyopathy -- ejection fraction 30-35%  . Polymorphic ventricular tachycardia     with VF arrest, s/p subsequent ICD  . Ejection fraction < 50%     30-35%  . HL (hearing loss)   . CHF NYHA class II      New York Heart Association class II/III heart failure  . Adrenal mass     currently being evaluated -- 4.1 cm right adrenal mass  . Acute anterior myocardial infarction 09/02/2008    post emergent stenting of the LAD in January 2010 -- anterior ST-elevation myocardial infarction  . Hyperlipidemia   . Hypertension   . Cardiac arrest - ventricular fibrillation 09/02/2008    Out of hospital cardiac arrest secondary to arrhythmia  . History of methicillin resistant staphylococcus aureus (MRSA)     History of methicillin-sensitive Staphylococcus aureus bacteremia  with implantable cardioverter-defibrillator explant, February 23, 2009  . CHF (congestive heart failure)   . Pericarditis     history of  acute pericarditis in July 2010 now resolved  . Anemia   . S/P CABG (coronary artery bypass graft) Sept 2012   Past Surgical History  Procedure Laterality Date  . Ankle surgery    . Icd implant  02/23/2009     with subsequent extraction for MSSA bacteremia  . Icd reimplantation  05/09/2009    status post implant of a Medtronic Maximo II DR dual- chamber cardioverter defibrillator by Dr. Hillis Range  . Cardiac catheterization  08/26/2008    Est. EF of 45% -- Successful intracoronary stenting of the mid-LAD -- Three-vessel atherosclerotic coronary artery disease.  The patient has occlusion of the mid LAD, which is his culprit lesion.  He has  moderate disease in the proximal circumflex and PDA -- Moderate left ventricular dysfunction -- [name], [name]  . Cardiac catheterization  03/16/2009     EF was approximately 30% -- Atherosclerotic coronary vascular disease, three-vessel --  Moderate-to-severe ischemic cardiomyopathy -- No clear evidence for an acute culprit lesion that would explain the patient's chest pain -- [name], [name].   . Coronary artery bypass graft  05/07/2011    CABG x3:  LIMA to LAD, SVG to OM2, SVG to PDA    Current Outpatient Prescriptions  Medication Sig Dispense Refill  . aspirin EC 325 MG tablet Take 325 mg by mouth daily.        Marland Kitchen atorvastatin (LIPITOR) 80 MG tablet TAKE 1 TABLET BY MOUTH EVERY NIGHT AT BEDTIME  30 tablet  5  .  carvedilol (COREG) 25 MG tablet Take 1 tablet (25 mg total) by mouth 2 (two) times daily with a meal.  60 tablet  11  . ramipril (ALTACE) 10 MG capsule Take 1 capsule (10 mg total) by mouth daily.  90 capsule  3  . spironolactone (ALDACTONE) 25 MG tablet Take 1 tablet (25 mg total) by mouth daily.  30 tablet  11   No current facility-administered medications for this visit.    Allergies  Allergen Reactions  . Morphine And Related     History   Social History  . Marital Status: Married    Spouse Name: N/A    Number of  Children: 3  . Years of Education: N/A   Occupational History  . Curator farm equipment    Social History Main Topics  . Smoking status: Former Smoker -- 37 years    Types: Cigarettes    Quit date: 12/18/2011  . Smokeless tobacco: Not on file     Comment: 1 pack per week. Has been smoking for 35 years.   . Alcohol Use: No  . Drug Use: No  . Sexually Active: Not on file   Other Topics Concern  . Not on file   Social History Narrative   SOCIAL HISTORY:  He is married for 37 years with children.  He smokes 1 pack per day. He has been smoking for 37 years.  He denies alcohol use.  He works as a  Curator.         FAMILY HISTORY:  Father has a history of coronary artery disease. Mother died in her 88s with a stroke.  He has 2 sisters who are alive and well.     Family History  Problem Relation Age of Onset  . Stroke Mother   . Coronary artery disease Father     Physical Exam: Filed Vitals:   10/16/12 0913  BP: 162/92  Pulse: 84  Weight: 187 lb 1.9 oz (84.877 kg)    GEN- The patient is well appearing, alert and oriented x 3 today.   Head- normocephalic, atraumatic Eyes-  Sclera clear, conjunctiva pink Ears- hearing intact Oropharynx- clear Neck- supple, no JVP Lymph- no cervical lymphadenopathy Lungs- Clear to ausculation bilaterally, normal work of breathing Chest- ICD pocket is well healed Heart- Regular rate and rhythm, no murmurs, rubs or gallops, PMI not laterally displaced GI- soft, NT, ND, + BS Extremities- no clubbing, cyanosis, or edema MS- no significant deformity or atrophy Skin- no rash or lesion Psych- euthymic mood, full affect Neuro- strength and sensation are intact  ICD interrogation- reviewed in detail today,  See PACEART report  Assessment and Plan:

## 2012-10-26 IMAGING — CR DG CHEST 2V
2 series · 2 of 2 positions shown · non-contrast
Comparison: 04/30/2011

CLINICAL DATA: Preop for CABG.  Hypertension.  Defibrillator.
Smoker.

CHEST - 2 VIEW

[w chest pa]
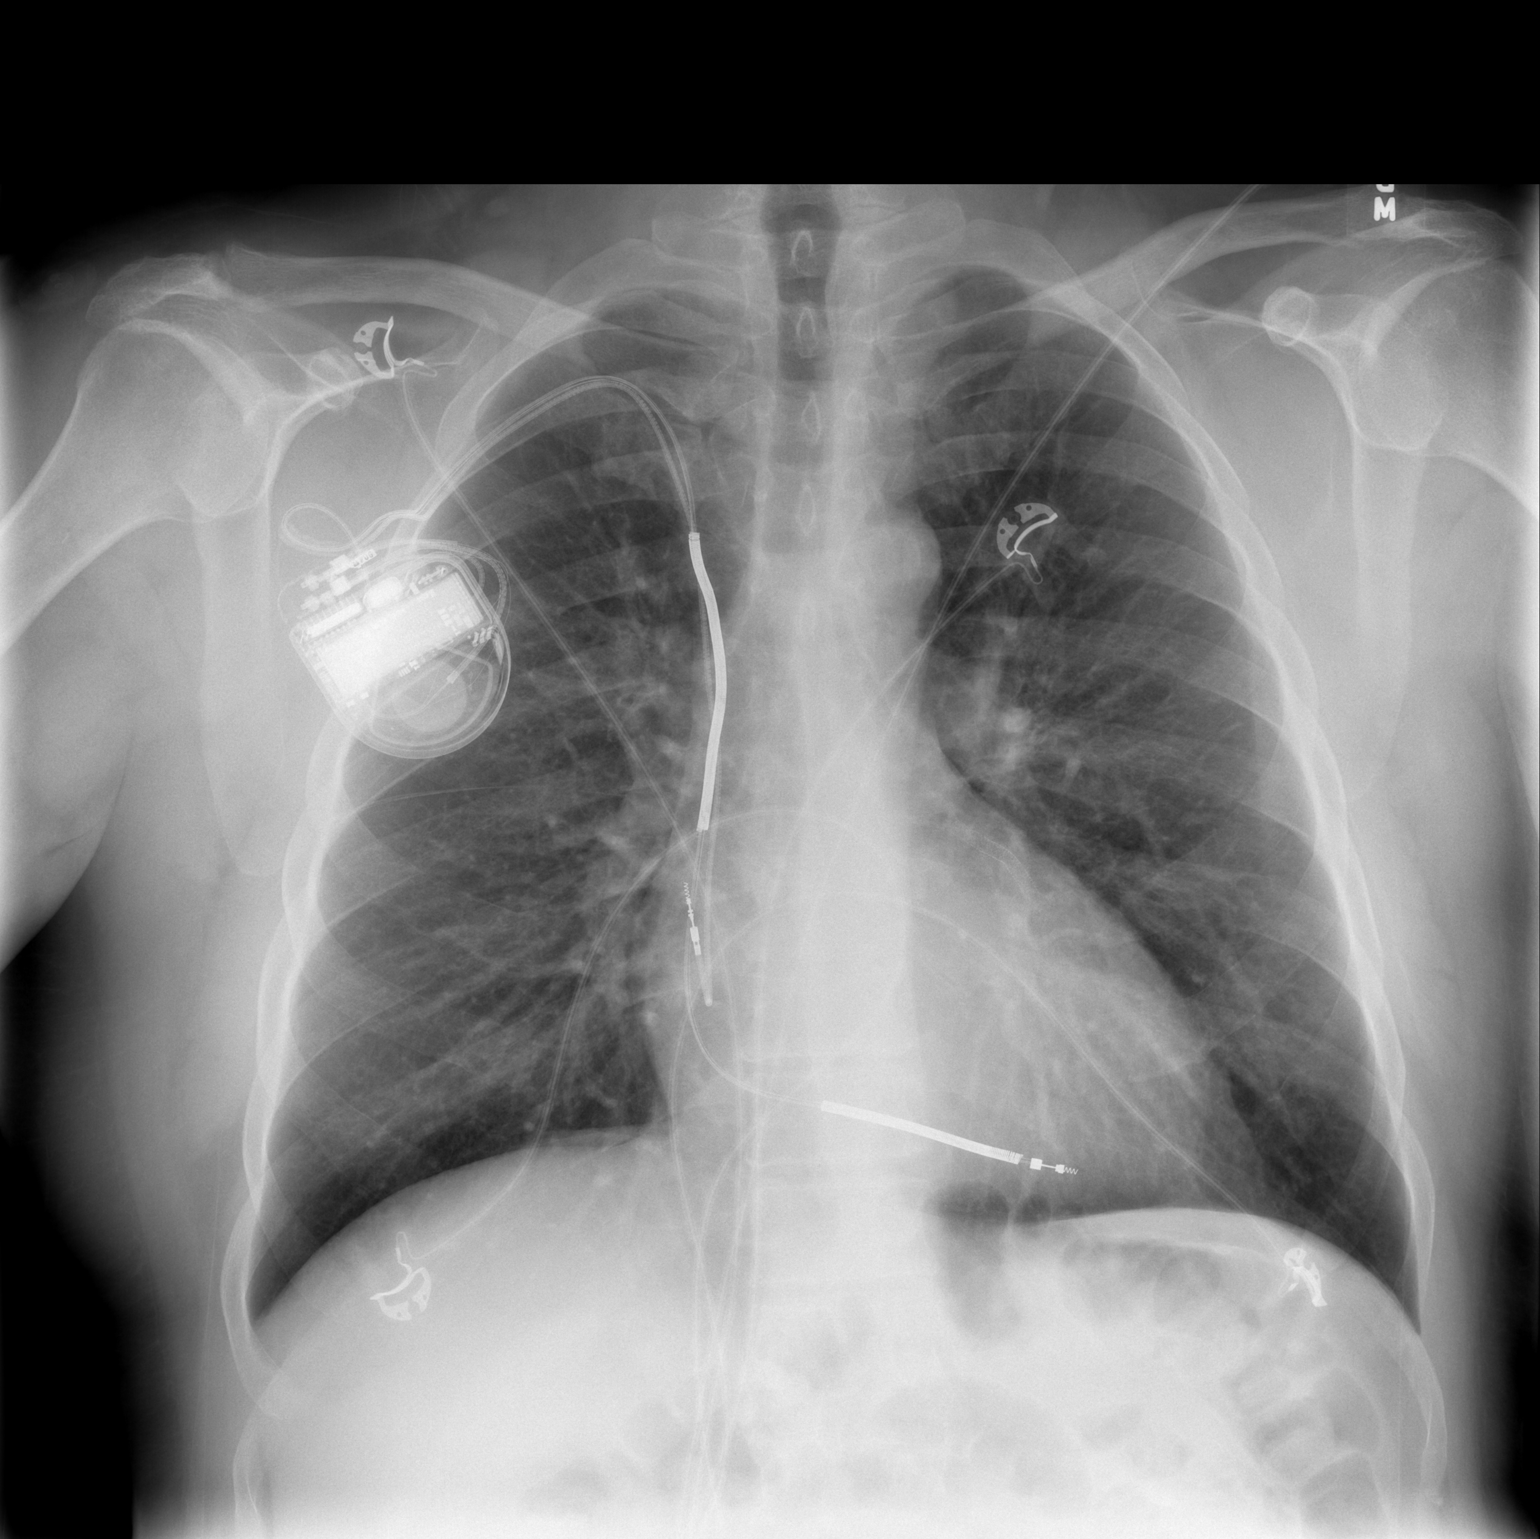

[w chest lat]
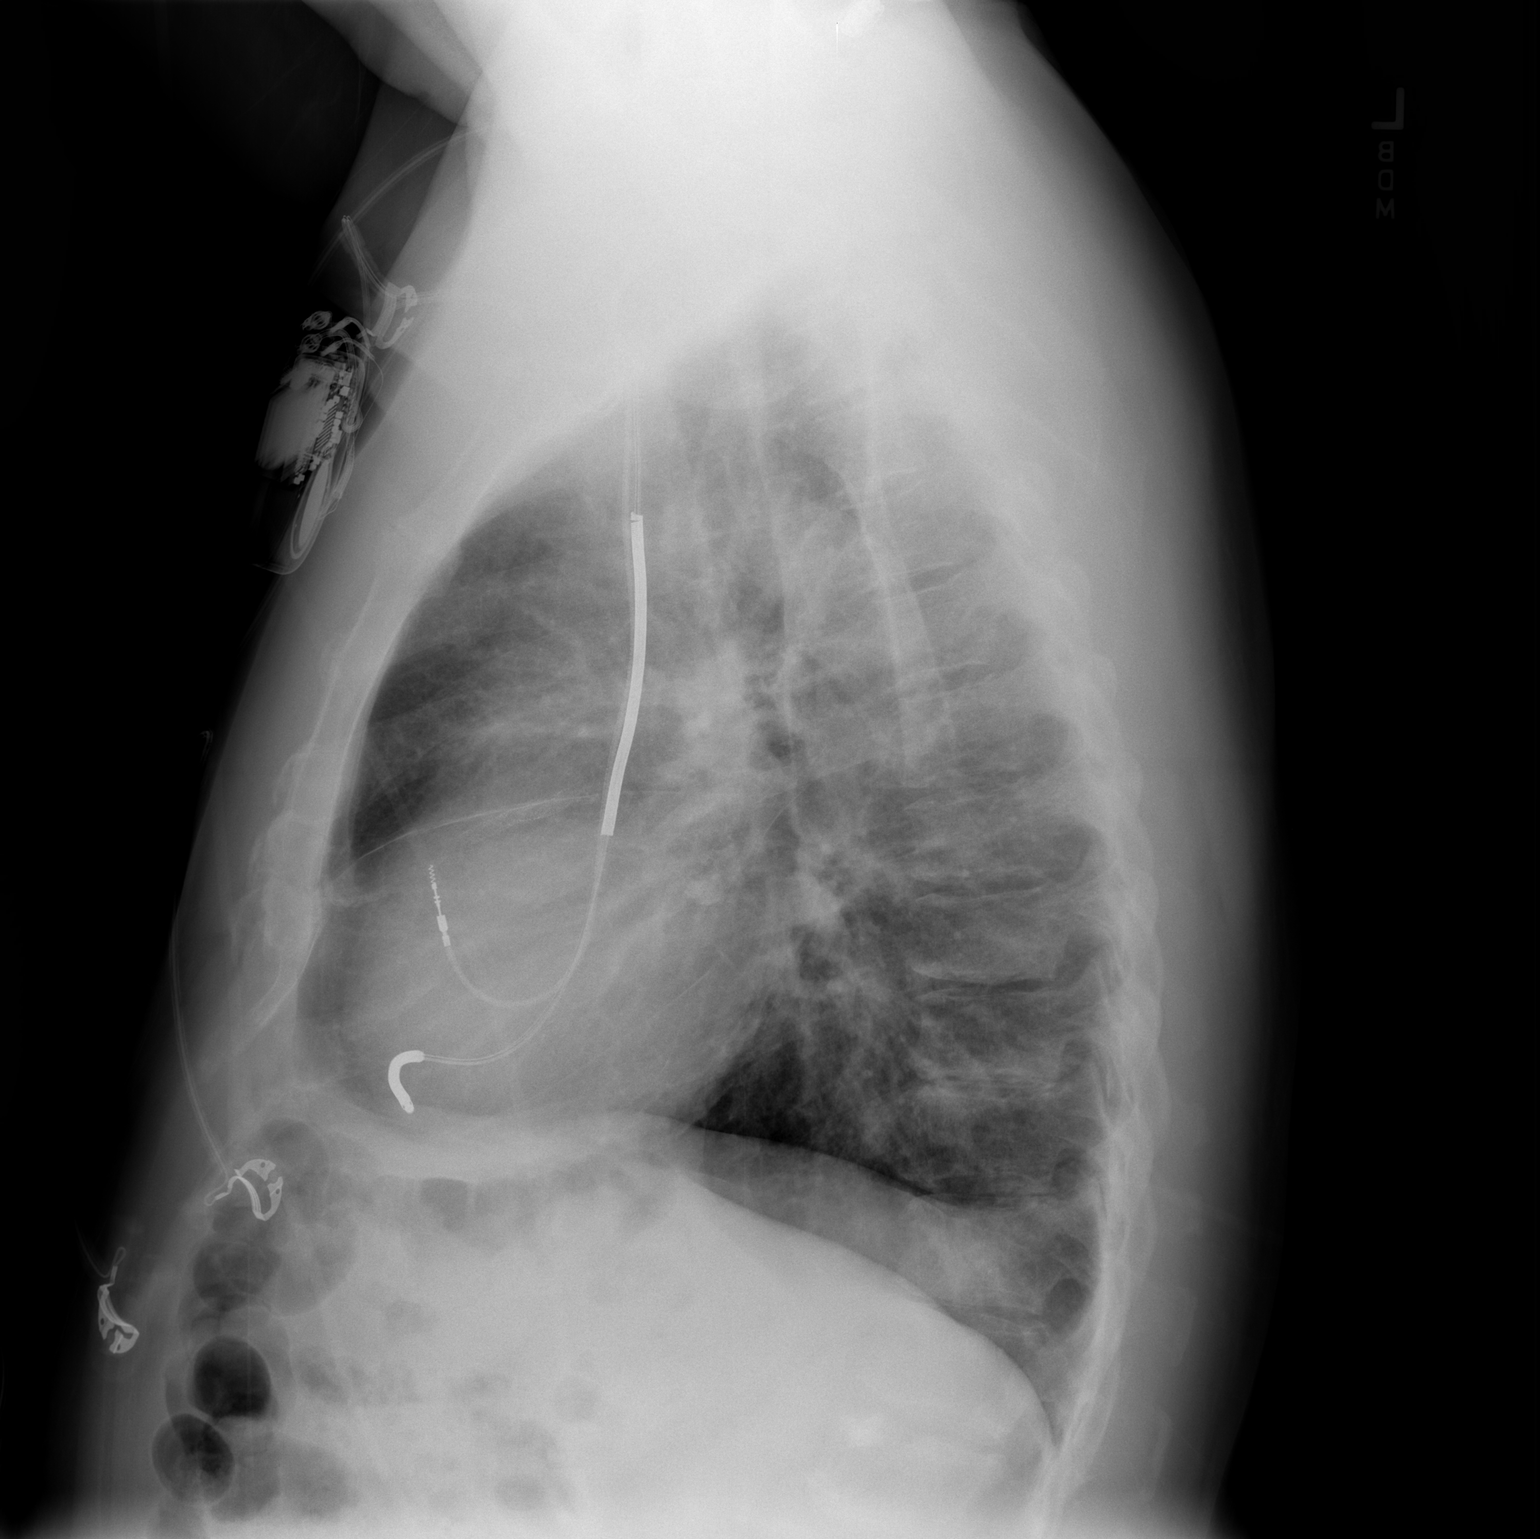

[2 of 2 positions shown; findings below may reference images not displayed]

FINDINGS: Stable appearance of two lead cardiac pacemaker. The
heart size and pulmonary vascularity are normal. The lungs appear
clear and expanded without focal air space disease or
consolidation. No blunting of the costophrenic angles.  No
significant change since the previous study.
IMPRESSION: No evidence of active pulmonary disease.

## 2012-12-03 ENCOUNTER — Ambulatory Visit (INDEPENDENT_AMBULATORY_CARE_PROVIDER_SITE_OTHER): Payer: Medicare Other | Admitting: Cardiology

## 2012-12-03 ENCOUNTER — Other Ambulatory Visit (INDEPENDENT_AMBULATORY_CARE_PROVIDER_SITE_OTHER): Payer: Medicare Other

## 2012-12-03 ENCOUNTER — Encounter: Payer: Self-pay | Admitting: Cardiology

## 2012-12-03 VITALS — BP 140/89 | HR 70 | Ht 67.0 in | Wt 189.4 lb

## 2012-12-03 DIAGNOSIS — I472 Ventricular tachycardia: Secondary | ICD-10-CM | POA: Diagnosis not present

## 2012-12-03 DIAGNOSIS — F17200 Nicotine dependence, unspecified, uncomplicated: Secondary | ICD-10-CM

## 2012-12-03 DIAGNOSIS — I2510 Atherosclerotic heart disease of native coronary artery without angina pectoris: Secondary | ICD-10-CM | POA: Diagnosis not present

## 2012-12-03 DIAGNOSIS — I5020 Unspecified systolic (congestive) heart failure: Secondary | ICD-10-CM

## 2012-12-03 DIAGNOSIS — I10 Essential (primary) hypertension: Secondary | ICD-10-CM | POA: Diagnosis not present

## 2012-12-03 DIAGNOSIS — Z951 Presence of aortocoronary bypass graft: Secondary | ICD-10-CM | POA: Diagnosis not present

## 2012-12-03 LAB — BASIC METABOLIC PANEL
CO2: 26 mEq/L (ref 19–32)
Chloride: 106 mEq/L (ref 96–112)
Potassium: 4.6 mEq/L (ref 3.5–5.1)
Sodium: 139 mEq/L (ref 135–145)

## 2012-12-03 LAB — HEPATIC FUNCTION PANEL
Albumin: 3.8 g/dL (ref 3.5–5.2)
Total Protein: 6.9 g/dL (ref 6.0–8.3)

## 2012-12-03 LAB — LIPID PANEL
HDL: 27 mg/dL — ABNORMAL LOW (ref >39.00)
LDL Cholesterol: 92 mg/dL (ref 0–99)
Total CHOL/HDL Ratio: 5
Triglycerides: 55 mg/dL (ref 0.0–149.0)

## 2012-12-03 MED ORDER — RAMIPRIL 10 MG PO CAPS: 10.0000 mg | ORAL_CAPSULE | Freq: Two times a day (BID) | ORAL | Status: DC

## 2012-12-03 NOTE — Progress Notes (Signed)
[name] Date of Birth: 03-Jul-1956   History of Present Illness: [name] is seen back today for a followup visit. He is status post CABG in September 2012 following an anterior myocardial infarction.. He has a history of cardiac arrest and is status post ICD implant. He continues to smoke. He is exercising regularly. He feels well. He denies any chest pain, palpitations, or dyspnea. He has noticed that his blood pressure is running higher now.  Current Outpatient Prescriptions on File Prior to Visit  Medication Sig Dispense Refill  . aspirin EC 325 MG tablet Take 325 mg by mouth daily.        Marland Kitchen atorvastatin (LIPITOR) 80 MG tablet TAKE 1 TABLET BY MOUTH EVERY NIGHT AT BEDTIME  30 tablet  5  . carvedilol (COREG) 25 MG tablet Take 1 tablet (25 mg total) by mouth 2 (two) times daily with a meal.  60 tablet  11  . spironolactone (ALDACTONE) 25 MG tablet Take 1 tablet (25 mg total) by mouth daily.  30 tablet  11   No current facility-administered medications on file prior to visit.    Allergies  Allergen Reactions  . Morphine And Related     Past Medical History  Diagnosis Date  . CAD (coronary artery disease)     Est. EF of 45% -- Successful intracoronary stenting of the mid-LAD -- Three-vessel atherosclerotic coronary artery disease.  The patient has occlusion of the mid LAD, which is his culprit lesion.  He has  moderate disease in the proximal circumflex and PDA -- Moderate left ventricular dysfunction -- Kristan Brummitt M. [place], [name]  . Ischemic cardiomyopathy     severe ischemic cardiomyopathy -- ejection fraction 30-35%  . Polymorphic ventricular tachycardia     with VF arrest, s/p subsequent ICD  . Ejection fraction < 50%     30-35%  . HL (hearing loss)   . CHF NYHA class II      New York Heart Association class II/III heart failure  . Adrenal mass     currently being evaluated -- 4.1 cm right adrenal mass  . Acute anterior myocardial infarction 09/02/2008    post  emergent stenting of the LAD in January 2010 -- anterior ST-elevation myocardial infarction  . Hyperlipidemia   . Hypertension   . Cardiac arrest - ventricular fibrillation 09/02/2008    Out of hospital cardiac arrest secondary to arrhythmia  . History of methicillin resistant staphylococcus aureus (MRSA)     History of methicillin-sensitive Staphylococcus aureus bacteremia  with implantable cardioverter-defibrillator explant, February 23, 2009  . CHF (congestive heart failure)   . Pericarditis     history of acute pericarditis in July 2010 now resolved  . Anemia   . S/P CABG (coronary artery bypass graft) Sept 2012    Past Surgical History  Procedure Laterality Date  . Ankle surgery    . Icd implant  02/23/2009     with subsequent extraction for MSSA bacteremia  . Icd reimplantation  05/09/2009    status post implant of a Medtronic Maximo II DR dual- chamber cardioverter defibrillator by Dr. Hillis Range  . Cardiac catheterization  08/26/2008    Est. EF of 45% -- Successful intracoronary stenting of the mid-LAD -- Three-vessel atherosclerotic coronary artery disease.  The patient has occlusion of the mid LAD, which is his culprit lesion.  He has  moderate disease in the proximal circumflex and PDA -- Moderate left ventricular dysfunction -- Virgil Slinger M. [place], [name]  .  Cardiac catheterization  03/16/2009     EF was approximately 30% -- Atherosclerotic coronary vascular disease, three-vessel --  Moderate-to-severe ischemic cardiomyopathy -- No clear evidence for an acute culprit lesion that would explain the patient's chest pain -- [name], [name].   . Coronary artery bypass graft  05/07/2011    CABG x3:  LIMA to LAD, SVG to OM2, SVG to PDA    History  Smoking status  . Former Smoker -- 37 years  . Types: Cigarettes  . Quit date: 12/18/2011  Smokeless tobacco  . Not on file    Comment: 1 pack per week. Has been smoking for 35 years.     History  Alcohol Use No    Family History   Problem Relation Age of Onset  . Stroke Mother   . Coronary artery disease Father     Review of Systems: The review of systems is per the HPI.  All other systems were reviewed and are negative.  Physical Exam: BP 140/89  Pulse 70  Ht 5\' 7"  (1.702 m)  Wt 189 lb 6.4 oz (85.911 kg)  BMI 29.66 kg/m2  SpO2 99% Patient is very pleasant and in no acute distress. Skin is warm and dry. Color is normal.  HEENT is unremarkable. Normocephalic/atraumatic. PERRL. Sclera are nonicteric. Neck is supple. No masses. No JVD. Lungs are clear. Cardiac exam shows a regular rate and rhythm. Sternum looks good. Abdomen is soft. Extremities are without edema. Gait and ROM are intact. No gross neurologic deficits noted.   LABORATORY DATA:  Lab Results  Component Value Date   WBC 7.0 07/09/2011   HGB 13.8 07/09/2011   HCT 41.2 07/09/2011   PLT 244.0 07/09/2011   GLUCOSE 95 12/03/2012   CHOL 130 12/03/2012   TRIG 55.0 12/03/2012   HDL 27.00* 12/03/2012   LDLDIRECT 217.3 04/04/2011   LDLCALC 92 12/03/2012   ALT 23 12/03/2012   AST 19 12/03/2012   NA 139 12/03/2012   K 4.6 12/03/2012   CL 106 12/03/2012   CREATININE 0.9 12/03/2012   BUN 14 12/03/2012   CO2 26 12/03/2012   TSH 2.567 Test methodology is 3rd generation TSH 03/16/2009   INR 1.48 05/07/2011   HGBA1C  Value: 5.2 (NOTE) The ADA recommends the following therapeutic goal for glycemic control related to Hgb A1c measurement: Goal of therapy: <6.5 Hgb A1c  Reference: American Diabetes Association: Clinical Practice Recommendations 2010, Diabetes Care, 2010, 33: (Suppl  1). 03/16/2009      Assessment / Plan: 1. Coronary disease status post anterior myocardial infarction. Status post CABG in September 2012. Patient is asymptomatic. We'll continue with aspirin, beta blocker, and statin therapy.  2. Ischemic cardiomyopathy ejection fraction 35%. He is on optimal medical therapy with carvedilol, Altace, and Aldactone. No evidence of volume overload. He has class  I symptoms.  3. Tobacco abuse. Encourage smoking cessation.  4. History of cardiac arrest with polymorphic VT. Status post ICD implant.  5. Hyperlipidemia, under fair control on high-dose Lipitor.  6. Hypertension, blood pressure is elevated. We will increase his Altace to 20 mg daily.

## 2012-12-03 NOTE — Patient Instructions (Signed)
We will call with the results of your lab work   Increase Altace (ramipril) to 10 mg twice a day.  Stop smoking  I will see you in 6 months.

## 2013-01-12 ENCOUNTER — Ambulatory Visit (INDEPENDENT_AMBULATORY_CARE_PROVIDER_SITE_OTHER): Payer: Medicare Other | Admitting: Cardiology

## 2013-01-12 ENCOUNTER — Encounter: Payer: Self-pay | Admitting: Cardiology

## 2013-01-12 ENCOUNTER — Encounter: Payer: Self-pay | Admitting: Internal Medicine

## 2013-01-12 VITALS — BP 147/90 | HR 76 | Ht 67.0 in | Wt 186.4 lb

## 2013-01-12 DIAGNOSIS — I2589 Other forms of chronic ischemic heart disease: Secondary | ICD-10-CM | POA: Diagnosis not present

## 2013-01-12 DIAGNOSIS — Z95810 Presence of automatic (implantable) cardiac defibrillator: Secondary | ICD-10-CM

## 2013-01-12 DIAGNOSIS — I255 Ischemic cardiomyopathy: Secondary | ICD-10-CM

## 2013-01-12 LAB — ICD DEVICE OBSERVATION
ATRIAL PACING ICD: 56.3 pct
BATTERY VOLTAGE: 3.06 V
DEV-0020ICD: NEGATIVE
FVT: 0
RV LEAD IMPEDENCE ICD: 494 Ohm
RV LEAD THRESHOLD: 1 V
TZAT-0002SLOWVT: NEGATIVE
TZAT-0018SLOWVT: NEGATIVE
TZAT-0019SLOWVT: 8 V
TZAT-0020FASTVT: 1.5 ms
TZAT-0020SLOWVT: 1.5 ms
TZON-0003SLOWVT: 360 ms
TZON-0004SLOWVT: 32
TZON-0004VSLOWVT: 36
TZON-0005SLOWVT: 12
TZST-0001FASTVT: 3
TZST-0001FASTVT: 5
TZST-0001SLOWVT: 2
TZST-0001SLOWVT: 3
TZST-0001SLOWVT: 6
TZST-0002FASTVT: NEGATIVE
TZST-0002FASTVT: NEGATIVE
TZST-0002FASTVT: NEGATIVE
TZST-0002SLOWVT: NEGATIVE
TZST-0002SLOWVT: NEGATIVE
VENTRICULAR PACING ICD: 0 pct
VF: 0

## 2013-01-12 NOTE — Progress Notes (Signed)
Routine ICD check/device clinic visit. Seen by Dr. Johney Frame in Feb 2014 for routine EP follow-up. [name] has no complaints and states he feels great.  Normal ICD function. No programming changes made. See PaceArt report.

## 2013-03-04 ENCOUNTER — Encounter (HOSPITAL_COMMUNITY): Payer: Self-pay | Admitting: Neurology

## 2013-03-04 ENCOUNTER — Inpatient Hospital Stay (HOSPITAL_COMMUNITY)
Admission: EM | Admit: 2013-03-04 | Discharge: 2013-03-06 | DRG: 227 | Disposition: A | Discharge status: Home / Self Care | Payer: Medicare Other | Attending: Cardiology | Admitting: Cardiology

## 2013-03-04 ENCOUNTER — Emergency Department (HOSPITAL_COMMUNITY): Payer: Medicare Other

## 2013-03-04 DIAGNOSIS — H9190 Unspecified hearing loss, unspecified ear: Secondary | ICD-10-CM | POA: Diagnosis present

## 2013-03-04 DIAGNOSIS — I472 Ventricular tachycardia, unspecified: Secondary | ICD-10-CM

## 2013-03-04 DIAGNOSIS — A4101 Sepsis due to Methicillin susceptible Staphylococcus aureus: Secondary | ICD-10-CM

## 2013-03-04 DIAGNOSIS — F17200 Nicotine dependence, unspecified, uncomplicated: Secondary | ICD-10-CM | POA: Diagnosis present

## 2013-03-04 DIAGNOSIS — I509 Heart failure, unspecified: Secondary | ICD-10-CM | POA: Diagnosis not present

## 2013-03-04 DIAGNOSIS — Z4502 Encounter for adjustment and management of automatic implantable cardiac defibrillator: Secondary | ICD-10-CM

## 2013-03-04 DIAGNOSIS — Z951 Presence of aortocoronary bypass graft: Secondary | ICD-10-CM | POA: Diagnosis not present

## 2013-03-04 DIAGNOSIS — Z8674 Personal history of sudden cardiac arrest: Secondary | ICD-10-CM

## 2013-03-04 DIAGNOSIS — T82198A Other mechanical complication of other cardiac electronic device, initial encounter: Secondary | ICD-10-CM

## 2013-03-04 DIAGNOSIS — Z95810 Presence of automatic (implantable) cardiac defibrillator: Secondary | ICD-10-CM

## 2013-03-04 DIAGNOSIS — Z9861 Coronary angioplasty status: Secondary | ICD-10-CM

## 2013-03-04 DIAGNOSIS — E876 Hypokalemia: Secondary | ICD-10-CM

## 2013-03-04 DIAGNOSIS — I252 Old myocardial infarction: Secondary | ICD-10-CM | POA: Diagnosis not present

## 2013-03-04 DIAGNOSIS — I469 Cardiac arrest, cause unspecified: Secondary | ICD-10-CM

## 2013-03-04 DIAGNOSIS — E785 Hyperlipidemia, unspecified: Secondary | ICD-10-CM | POA: Diagnosis present

## 2013-03-04 DIAGNOSIS — Z8614 Personal history of Methicillin resistant Staphylococcus aureus infection: Secondary | ICD-10-CM | POA: Diagnosis not present

## 2013-03-04 DIAGNOSIS — I4729 Other ventricular tachycardia: Secondary | ICD-10-CM | POA: Diagnosis present

## 2013-03-04 DIAGNOSIS — I5020 Unspecified systolic (congestive) heart failure: Secondary | ICD-10-CM

## 2013-03-04 DIAGNOSIS — E279 Disorder of adrenal gland, unspecified: Secondary | ICD-10-CM | POA: Diagnosis present

## 2013-03-04 DIAGNOSIS — I214 Non-ST elevation (NSTEMI) myocardial infarction: Secondary | ICD-10-CM

## 2013-03-04 DIAGNOSIS — T8389XA Other specified complication of genitourinary prosthetic devices, implants and grafts, initial encounter: Secondary | ICD-10-CM | POA: Diagnosis not present

## 2013-03-04 DIAGNOSIS — D649 Anemia, unspecified: Secondary | ICD-10-CM | POA: Diagnosis present

## 2013-03-04 DIAGNOSIS — I2510 Atherosclerotic heart disease of native coronary artery without angina pectoris: Secondary | ICD-10-CM | POA: Diagnosis present

## 2013-03-04 DIAGNOSIS — I5022 Chronic systolic (congestive) heart failure: Secondary | ICD-10-CM | POA: Diagnosis not present

## 2013-03-04 DIAGNOSIS — Y831 Surgical operation with implant of artificial internal device as the cause of abnormal reaction of the patient, or of later complication, without mention of misadventure at the time of the procedure: Secondary | ICD-10-CM | POA: Diagnosis present

## 2013-03-04 DIAGNOSIS — T82190A Other mechanical complication of cardiac electrode, initial encounter: Secondary | ICD-10-CM | POA: Diagnosis not present

## 2013-03-04 DIAGNOSIS — I4901 Ventricular fibrillation: Secondary | ICD-10-CM | POA: Diagnosis not present

## 2013-03-04 DIAGNOSIS — T8489XA Other specified complication of internal orthopedic prosthetic devices, implants and grafts, initial encounter: Secondary | ICD-10-CM | POA: Diagnosis not present

## 2013-03-04 DIAGNOSIS — E278 Other specified disorders of adrenal gland: Secondary | ICD-10-CM

## 2013-03-04 DIAGNOSIS — E7800 Pure hypercholesterolemia, unspecified: Secondary | ICD-10-CM

## 2013-03-04 DIAGNOSIS — I10 Essential (primary) hypertension: Secondary | ICD-10-CM

## 2013-03-04 DIAGNOSIS — I2589 Other forms of chronic ischemic heart disease: Secondary | ICD-10-CM | POA: Diagnosis present

## 2013-03-04 DIAGNOSIS — G931 Anoxic brain damage, not elsewhere classified: Secondary | ICD-10-CM

## 2013-03-04 DIAGNOSIS — R9389 Abnormal findings on diagnostic imaging of other specified body structures: Secondary | ICD-10-CM | POA: Diagnosis not present

## 2013-03-04 LAB — CBC WITH DIFFERENTIAL/PLATELET
Basophils Absolute: 0 10*3/uL (ref 0.0–0.1)
Basophils Relative: 0 % (ref 0–1)
Eosinophils Absolute: 0.3 10*3/uL (ref 0.0–0.7)
Hemoglobin: 15.6 g/dL (ref 13.0–17.0)
MCH: 32 pg (ref 26.0–34.0)
MCHC: 33.6 g/dL (ref 30.0–36.0)
Monocytes Absolute: 0.7 10*3/uL (ref 0.1–1.0)
Monocytes Relative: 8 % (ref 3–12)
Neutro Abs: 5.6 10*3/uL (ref 1.7–7.7)
Neutrophils Relative %: 66 % (ref 43–77)
RDW: 12.7 % (ref 11.5–15.5)

## 2013-03-04 LAB — ICD DEVICE OBSERVATION

## 2013-03-04 LAB — CBC
Hemoglobin: 15.2 g/dL (ref 13.0–17.0)
MCH: 33.2 pg (ref 26.0–34.0)
MCV: 94.8 fL (ref 78.0–100.0)
Platelets: 239 10*3/uL (ref 150–400)
RBC: 4.58 MIL/uL (ref 4.22–5.81)
WBC: 10.4 10*3/uL (ref 4.0–10.5)

## 2013-03-04 LAB — BASIC METABOLIC PANEL
BUN: 15 mg/dL (ref 6–23)
Chloride: 103 mEq/L (ref 96–112)
Creatinine, Ser: 0.89 mg/dL (ref 0.50–1.35)
GFR calc Af Amer: >90 mL/min (ref >90)
GFR calc non Af Amer: >90 mL/min (ref >90)
Potassium: 4.5 mEq/L (ref 3.5–5.1)

## 2013-03-04 LAB — TROPONIN I: Troponin I: 8.29 ng/mL (ref <0.30)

## 2013-03-04 LAB — CREATININE, SERUM
Creatinine, Ser: 0.99 mg/dL (ref 0.50–1.35)
GFR calc Af Amer: >90 mL/min (ref >90)

## 2013-03-04 LAB — POCT I-STAT TROPONIN I: Troponin i, poc: 2.63 ng/mL (ref 0.00–0.08)

## 2013-03-04 MED ORDER — SODIUM CHLORIDE 0.9 % IJ SOLN: 3.0000 mL | INTRAMUSCULAR | Status: DC | PRN

## 2013-03-04 MED ORDER — CARVEDILOL 25 MG PO TABS
25.0000 mg | ORAL_TABLET | Freq: Two times a day (BID) | ORAL | Status: DC
  Administered 2013-03-04 – 2013-03-06 (×3): 25 mg via ORAL
  Filled 2013-03-04 (×6): qty 1

## 2013-03-04 MED ORDER — ONDANSETRON HCL 4 MG/2ML IJ SOLN: 4.0000 mg | Freq: Four times a day (QID) | INTRAMUSCULAR | Status: DC | PRN

## 2013-03-04 MED ORDER — SODIUM CHLORIDE 0.9 % IV SOLN
INTRAVENOUS | Status: DC
  Administered 2013-03-05: 06:00:00 via INTRAVENOUS

## 2013-03-04 MED ORDER — ATORVASTATIN CALCIUM 80 MG PO TABS
80.0000 mg | ORAL_TABLET | Freq: Every day | ORAL | Status: DC
  Administered 2013-03-04 – 2013-03-05 (×2): 80 mg via ORAL
  Filled 2013-03-04 (×3): qty 1

## 2013-03-04 MED ORDER — NITROGLYCERIN 0.4 MG SL SUBL: 0.4000 mg | SUBLINGUAL_TABLET | SUBLINGUAL | Status: DC | PRN

## 2013-03-04 MED ORDER — ASPIRIN 81 MG PO CHEW: 324.0000 mg | CHEWABLE_TABLET | ORAL | Status: DC

## 2013-03-04 MED ORDER — ASPIRIN 300 MG RE SUPP
300.0000 mg | RECTAL | Status: DC
  Filled 2013-03-04: qty 1

## 2013-03-04 MED ORDER — RAMIPRIL 10 MG PO CAPS
10.0000 mg | ORAL_CAPSULE | Freq: Two times a day (BID) | ORAL | Status: DC
Start: 2013-03-04 — End: 2013-03-06
  Administered 2013-03-04 – 2013-03-06 (×4): 10 mg via ORAL
  Filled 2013-03-04 (×6): qty 1

## 2013-03-04 MED ORDER — ASPIRIN EC 325 MG PO TBEC
325.0000 mg | DELAYED_RELEASE_TABLET | Freq: Every day | ORAL | Status: DC
  Administered 2013-03-05 – 2013-03-06 (×2): 325 mg via ORAL
  Filled 2013-03-04 (×2): qty 1

## 2013-03-04 MED ORDER — ACETAMINOPHEN 325 MG PO TABS: 650.0000 mg | ORAL_TABLET | ORAL | Status: DC | PRN

## 2013-03-04 MED ORDER — SODIUM CHLORIDE 0.9 % IR SOLN
80.0000 mg | Status: DC
  Filled 2013-03-04: qty 2

## 2013-03-04 MED ORDER — CEFAZOLIN SODIUM-DEXTROSE 2-3 GM-% IV SOLR
2.0000 g | INTRAVENOUS | Status: DC
  Filled 2013-03-04: qty 50

## 2013-03-04 MED ORDER — SODIUM CHLORIDE 0.9 % IV SOLN: 250.0000 mL | INTRAVENOUS | Status: DC | PRN

## 2013-03-04 MED ORDER — HEPARIN SODIUM (PORCINE) 5000 UNIT/ML IJ SOLN
5000.0000 [IU] | Freq: Three times a day (TID) | INTRAMUSCULAR | Status: DC
Start: 2013-03-04 — End: 2013-03-04
  Filled 2013-03-04 (×3): qty 1

## 2013-03-04 MED ORDER — SODIUM CHLORIDE 0.9 % IJ SOLN
3.0000 mL | Freq: Two times a day (BID) | INTRAMUSCULAR | Status: DC
  Administered 2013-03-04: 3 mL via INTRAVENOUS

## 2013-03-04 MED ORDER — SPIRONOLACTONE 25 MG PO TABS
25.0000 mg | ORAL_TABLET | Freq: Every day | ORAL | Status: DC
  Administered 2013-03-04 – 2013-03-06 (×3): 25 mg via ORAL
  Filled 2013-03-04 (×3): qty 1

## 2013-03-04 MED ORDER — SODIUM CHLORIDE 0.9 % IV SOLN
INTRAVENOUS | Status: DC
  Administered 2013-03-05: 10 mL/h via INTRAVENOUS

## 2013-03-04 MED ORDER — CHLORHEXIDINE GLUCONATE 4 % EX LIQD
60.0000 mL | Freq: Once | CUTANEOUS | Status: AC
  Administered 2013-03-05: 4 via TOPICAL
  Filled 2013-03-04 (×2): qty 60

## 2013-03-04 NOTE — ED Provider Notes (Signed)
I saw and evaluated the patient, reviewed the resident's note and I agree with the findings and plan. If applicable, I agree with the resident's interpretation of the EKG.  If applicable, I was present for critical portions of any procedures performed.  Ischemic cardiomyopathy with AICD presenting after 8 shocks this morning. Denies chest pain, SOB, dizziness, syncope.  Troponin elevated, likely from defibrillation. EKG unchanged. No VF or VT on interrogation.  D/w cards.  Glynn Octave, MD 03/04/13 [phone]

## 2013-03-04 NOTE — Consult Note (Addendum)
ELECTROPHYSIOLOGY CONSULT NOTE  Patient ID: [name] MRN: [id number], DOB/AGE: 1956-08-09   Admit date: 03/04/2013 Date of Consult: 03/04/2013  Primary Physician: Acey Lav, MD Primary Cardiologist / EP: [place], MD / Johney Frame, MD Reason for Consultation: ICD shocks  History of Present Illness [name] is a 57 y.o. male with an ischemic CM, EF 30-35%, prior VF arrest s/p ICD implant, chronic systolic HF, CAD s/p CABG and prior MRSA bacteremia who presented to [facility] ED after receiving multiple ICD shocks. He states while sitting at home drinking coffee this AM his ICD fired. There were no preceding symptoms. He was shocked an additional 7 times for a total of 8 shocks. He denies CP, SOB, palpitations, dizziness or syncope. He reports he has been "feeling great" and has no complaints. He reports compliance with his medications. He denies HF symptoms, specifically LE swelling, orthopnea or PND. In the ED a [facility] transmission was done. EGMs show RV lead noise which resulted in shocks.   Past Medical History Past Medical History  Diagnosis Date  . CAD (coronary artery disease)     Est. EF of 45% -- Successful intracoronary stenting of the mid-LAD -- Three-vessel atherosclerotic coronary artery disease.  The patient has occlusion of the mid LAD, which is his culprit lesion.  He has  moderate disease in the proximal circumflex and PDA -- Moderate left ventricular dysfunction -- [name], [name]  . Ischemic cardiomyopathy     severe ischemic cardiomyopathy -- ejection fraction 30-35%  . Polymorphic ventricular tachycardia     with VF arrest, s/p subsequent ICD  . Ejection fraction < 50%     30-35%  . HL (hearing loss)   . CHF NYHA class II      New York Heart Association class II/III heart failure  . Adrenal mass     currently being evaluated -- 4.1 cm right adrenal mass  . Acute anterior myocardial infarction 09/02/2008    post emergent stenting of the LAD in  January 2010 -- anterior ST-elevation myocardial infarction  . Hyperlipidemia   . Hypertension   . Cardiac arrest - ventricular fibrillation 09/02/2008    Out of hospital cardiac arrest secondary to arrhythmia  . History of methicillin resistant staphylococcus aureus (MRSA)     History of methicillin-sensitive Staphylococcus aureus bacteremia  with implantable cardioverter-defibrillator explant, February 23, 2009  . CHF (congestive heart failure)   . Pericarditis     history of acute pericarditis in July 2010 now resolved  . Anemia   . S/P CABG (coronary artery bypass graft) Sept 2012    Past Surgical History Past Surgical History  Procedure Laterality Date  . Ankle surgery    . Icd implant  02/23/2009     with subsequent extraction for MSSA bacteremia  . Icd reimplantation  05/09/2009    status post implant of a Medtronic Maximo II DR dual- chamber cardioverter defibrillator by Dr. Hillis Range  . Cardiac catheterization  08/26/2008    Est. EF of 45% -- Successful intracoronary stenting of the mid-LAD -- Three-vessel atherosclerotic coronary artery disease.  The patient has occlusion of the mid LAD, which is his culprit lesion.  He has  moderate disease in the proximal circumflex and PDA -- Moderate left ventricular dysfunction -- [name], [name]  . Cardiac catheterization  03/16/2009     EF was approximately 30% -- Atherosclerotic coronary vascular disease, three-vessel --  Moderate-to-severe ischemic cardiomyopathy -- No clear evidence for an  acute culprit lesion that would explain the patient's chest pain -- [name], [name].   . Coronary artery bypass graft  05/07/2011    CABG x3:  LIMA to LAD, SVG to OM2, SVG to PDA    Allergies/Intolerances Allergies  Allergen Reactions  . Morphine And Related    Inpatient Medications . aspirin  324 mg Oral NOW   Or  . aspirin  300 mg Rectal NOW  . [START ON 03/05/2013] aspirin EC  325 mg Oral Daily  . atorvastatin  80 mg Oral q1800    . carvedilol  25 mg Oral BID WC  . heparin  5,000 Units Subcutaneous Q8H  . ramipril  10 mg Oral BID  . sodium chloride  3 mL Intravenous Q12H  . spironolactone  25 mg Oral Daily   Family History Family History  Problem Relation Age of Onset  . Stroke Mother   . Coronary artery disease Father     Social History History   Social History  . Marital Status: Married    Spouse Name: N/A    Number of Children: 3  . Years of Education: N/A   Occupational History  . Curator farm equipment    Social History Main Topics  . Smoking status: Current Every Day Smoker -- 37 years    Types: Cigarettes    Last Attempt to Quit: 12/18/2011  . Smokeless tobacco: Not on file     Comment: 1 pack per week. Has been smoking for 35 years.   . Alcohol Use: No  . Drug Use: No  . Sexually Active: Not on file   Other Topics Concern  . Not on file   Social History Narrative   SOCIAL HISTORY:  He is married for 37 years with children.  He smokes 1 pack per day. He has been smoking for 37 years.  He denies alcohol use.  He works as a  Curator.         FAMILY HISTORY:  Father has a history of coronary artery disease. Mother died in her 54s with a stroke.  He has 2 sisters who are alive and well.     Review of Systems General: No chills, fever, night sweats or weight changes  Cardiovascular:  No chest pain, dyspnea on exertion, edema, orthopnea, palpitations, paroxysmal nocturnal dyspnea Dermatological: No rash, lesions or masses Respiratory: No cough, dyspnea Urologic: No hematuria, dysuria Abdominal: No nausea, vomiting, diarrhea, bright red blood per rectum, melena, or hematemesis Neurologic: No visual changes, weakness, changes in mental status All other systems reviewed and are otherwise negative except as noted above.  Physical Exam Vitals: Blood pressure 133/68, pulse 59, temperature 98.3 F (36.8 C), temperature source Oral, resp. rate 18, height 5\' 7"  (1.702 m), weight 183 lb 6.4 oz  (83.19 kg), SpO2 98.00%.  General: Well developed, well appearing 56 y.o. male in no acute distress. HEENT: Normocephalic, atraumatic. EOMs intact. Sclera nonicteric. Oropharynx clear.  Neck: Supple without bruits. No JVD. Lungs: Respirations regular and unlabored, CTA bilaterally. No wheezes, rales or rhonchi. Heart: RRR. S1, S2 present. No murmurs, rub, S3 or S4. Abdomen: Soft, non-tender, non-distended. BS present x 4 quadrants. No hepatosplenomegaly.  Extremities: No clubbing, cyanosis or edema. DP/PT/Radials 2+ and equal bilaterally. Psych: Normal affect. Neuro: Alert and oriented X 3. Moves all extremities spontaneously. Musculoskeletal: No kyphosis. Skin: Intact. Warm and dry. No rashes or petechiae in exposed areas.   Labs Troponin 2.63  Lab Results  Component Value Date   WBC  8.4 03/04/2013   HGB 15.6 03/04/2013   HCT 46.4 03/04/2013   MCV 95.1 03/04/2013   PLT 247 03/04/2013    Recent Labs Lab 03/04/13 0840  NA 136  K 4.5  CL 103  CO2 24  BUN 15  CREATININE 0.89  CALCIUM 9.4  GLUCOSE 104*    Radiology/Studies Dg Chest Portable 1 View  03/04/2013   *RADIOLOGY REPORT*  Clinical Data: Defibrillator discharge eight times, history of hypertension, coronary disease post CABG, ischemic cardiomyopathy, CHF  PORTABLE CHEST - 1 VIEW  Comparison: Portable exam 0829 hours compared to 06/03/2011  Findings: Upper normal heart size post CABG. Right subclavian AICD leads project over right atrium and right ventricle. Mediastinal contours and pulmonary vascularity normal. Lungs clear. No pleural effusion or pneumothorax. No acute osseous findings.  IMPRESSION: Post CABG and AICD. No acute abnormalities.   Original Report Authenticated By: Ulyses Southward, [name].    12-lead ECG shows SR at 64 bpm with normal intervals Telemetry shows SR; one 15-beat run NSVT   Assessment and Plan 1. Probable RV lead fracture   - Device interrogation findings were discussed with [name] and his family.  He will need RV lead revision. The procedure involved including   risks and benefits were reviewed in detail. These risks include, but are not limited to, bleeding, infection, pneumothorax, perforation,   tamponade, vascular damage, renal failure, MI, stroke and death. [name] expressed verbal understanding and wishes to proceed. This will   be scheduled for tomorrow, 03/05/2013. 2. Inappropriate ICD shocks, due to #1 3. NSVT  - Asymptomatic. Continue BB. 4. Prior VF arrest 5. Ischemic CM with chronic systolic HF  - Stable without HF symptoms. Continue medical therapy. 6. CAD  - Stable without anginal symptoms. Continue medical therapy.  Dr. Johney Frame to see Signed, Rick Duff, PA-C 03/04/2013, 1:59 PM   I have seen, examined the patient, and reviewed the above assessment and plan.  Changes to above are made where necessary.  The patient has had an ICD implanted for secondary prevention following a prior VF arrest in the setting of an ischemic CM.  He continues to have NSVT on telemetry here and is at high risk of sudden death.  I have reviewed his device interrogation at length.  This reveals inappropriate shocks for lead noise.  Though his initial impedances have been stable, his most recent RV lead pace/sense impedance is >3000 confirming lead fracture.  Given his risks for sudden death, I believe that lead replacement is required at this time. Risks, benefits, alternatives to ICD lead revision were discussed in detail with the patient today. The patient  understands that the risks include but are not limited to bleeding, infection, pneumothorax, perforation, tamponade, vascular damage, renal failure, MI, stroke, death, inappropriate shocks, and lead dislodgement and wishes to proceed.  We will therefore schedule device implantation at the next available time.  His elevated troponin is due to cardiac injury related to multiple ICD shocks.  This is not a MI.  Co Sign: Hillis Range,  MD 03/04/2013 10:19 PM

## 2013-03-04 NOTE — ED Notes (Signed)
Results of i-stat troponin shown to Dr. Manus Gunning at [phone] hrs.

## 2013-03-04 NOTE — ED Notes (Signed)
Per EMS- Pt woke up this morning was sitting on porch, defibrillator fired. In total fired 8 times this morning. Denying any cp or sob. Skin warm and dry. Given 325 aspirin, 1 nitro. EMS was called out to pts home this morning 2 times prior to transport, both times HR in SR no pain, denied needing transport. Pt reports defib checked 2 months ago, with no problems. 97% RA, BP 132/53, HR 83 SR. Pt is alert and oriented, speech clear and concise. Reports medtronic device.

## 2013-03-04 NOTE — H&P (Signed)
CARDIOLOGY CONSULT NOTE    Patient ID:  [name]  MRN: [id number]  DOB/AGE: 1956-05-07 57 y.o.   Admit date: 03/04/2013  Primary Physician Acey Lav, MD  Primary Cardiologist Dr. Johney Frame  Reason for Consultation Inappropriate AICD firing   HPI:   Pt w/ PMHx of CAD s/p 3v CABG 2012 ( + mid-LAD stent s/p MI in 2010), ischemic cardiomyopathy w/ EF 30% and pacer/AICD (placed in 2010), and V-fib arrest in 2010, presents to the ED after his AICD fired 8 times this morning at around 7:00 AM. He says he was out on the porch drinking coffee when his AICD fired 4 times within a couple minutes and then 4 more times within the following hour. He claims he has been feeling fine recently. The patient denies any recent SOB, chest pain, dyspnea on exertion, cough, fever, nausea, vomiting, dizziness, lightheadedness or palpitations. He also claims his AICD has never malfunctioned or fired in the past.    Past Medical History   Diagnosis  Date   .  CAD (coronary artery disease)      Est. EF of 45% -- Successful intracoronary stenting of the mid-LAD -- Three-vessel atherosclerotic coronary artery disease. The patient has occlusion of the mid LAD, which is his culprit lesion. He has moderate disease in the proximal circumflex and PDA -- Moderate left ventricular dysfunction -- [name], [name]   .  Ischemic cardiomyopathy      severe ischemic cardiomyopathy -- ejection fraction 30-35%   .  Polymorphic ventricular tachycardia      with VF arrest, s/p subsequent ICD   .  Ejection fraction < 50%      30-35%   .  HL (hearing loss)    .  CHF NYHA class II      New York Heart Association class II/III heart failure   .  Adrenal mass      currently being evaluated -- 4.1 cm right adrenal mass   .  Acute anterior myocardial infarction  09/02/2008     post emergent stenting of the LAD in January 2010 -- anterior ST-elevation myocardial infarction   .  Hyperlipidemia    .  Hypertension    .   Cardiac arrest - ventricular fibrillation  09/02/2008     Out of hospital cardiac arrest secondary to arrhythmia   .  History of methicillin resistant staphylococcus aureus (MRSA)      History of methicillin-sensitive Staphylococcus aureus bacteremia with implantable cardioverter-defibrillator explant, February 23, 2009   .  CHF (congestive heart failure)    .  Pericarditis      history of acute pericarditis in July 2010 now resolved   .  Anemia    .  S/P CABG (coronary artery bypass graft)  Sept 2012    Past Surgical History   Procedure  Laterality  Date   .  Ankle surgery     .  Icd implant   02/23/2009     with subsequent extraction for MSSA bacteremia   .  Icd reimplantation   05/09/2009     status post implant of a Medtronic Maximo II DR dual- chamber cardioverter defibrillator by Dr. Hillis Range   .  Cardiac catheterization   08/26/2008     Est. EF of 45% -- Successful intracoronary stenting of the mid-LAD -- Three-vessel atherosclerotic coronary artery disease. The patient has occlusion of the mid LAD, which is his culprit lesion. He has moderate disease in the proximal circumflex  and PDA -- Moderate left ventricular dysfunction -- [name], [name]   .  Cardiac catheterization   03/16/2009     EF was approximately 30% -- Atherosclerotic coronary vascular disease, three-vessel -- Moderate-to-severe ischemic cardiomyopathy -- No clear evidence for an acute culprit lesion that would explain the patient's chest pain -- [name], [name].   .  Coronary artery bypass graft   05/07/2011     CABG x3: LIMA to LAD, SVG to OM2, SVG to PDA    Allergies   Allergen  Reactions   .  Morphine And Related     I have reviewed the patient's current medications  Prior to Admission medications   Medication  Sig  Start Date  End Date  Taking?  Authorizing Provider   aspirin EC 325 MG tablet  Take 325 mg by mouth daily.    Yes  Historical Provider, MD   atorvastatin (LIPITOR) 80 MG tablet  Take 80  mg by mouth daily.    Yes  Historical Provider, MD   carvedilol (COREG) 25 MG tablet  Take 1 tablet (25 mg total) by mouth 2 (two) times daily with a meal.  10/16/12   Yes  Hillis Range, MD   ramipril (ALTACE) 10 MG capsule  Take 1 capsule (10 mg total) by mouth 2 (two) times daily.  12/03/12   Yes  [name], MD   spironolactone (ALDACTONE) 25 MG tablet  Take 1 tablet (25 mg total) by mouth daily.  10/15/12  10/15/13  Yes  [name], MD    History    Social History   .  Marital Status:  Married     Spouse Name:  N/A     Number of Children:  3   .  Years of Education:  N/A    Occupational History   .  Curator farm equipment     Social History Main Topics   .  Smoking status:  Current Every Day Smoker -- 37 years     Types:  Cigarettes     Last Attempt to Quit:  12/18/2011   .  Smokeless tobacco:  Not on file      Comment: 1 pack per week. Has been smoking for 35 years.   .  Alcohol Use:  No   .  Drug Use:  No   .  Sexually Active:  Not on file    Other Topics  Concern   .  Not on file    Social History Narrative    SOCIAL HISTORY: He is married for 37 years with children. He smokes 1 pack per day. He has been smoking for 37 years. He denies alcohol use. He works as a Curator.       FAMILY HISTORY: Father has a history of coronary artery disease. Mother died in her 70s with a stroke. He has 2 sisters who are alive and well.    Family Status   Relation  Status  Death Age   .  Mother  Deceased  86s   .  Sister  Alive    .  Sister  Alive     Family History   Problem  Relation  Age of Onset   .  Stroke  Mother    .  Coronary artery disease  Father     Review of Systems  Constitutional: Negative for fever, chills, malaise/fatigue and diaphoresis.  HENT: Negative for congestion.  Eyes: Negative for blurred vision and  double vision.  Respiratory: Negative for cough, shortness of breath and wheezing.  Cardiovascular: Negative for chest pain, palpitations and leg  swelling.  Gastrointestinal: Negative for heartburn, nausea, vomiting and abdominal pain.  Neurological: Negative for dizziness, tingling, tremors, weakness and headaches.   Physical Exam:  Blood pressure 140/85, pulse 60, temperature 98.3 F (36.8 C), temperature source Oral, resp. rate 12, height 5\' 7"  (1.702 m), weight 181 lb (82.101 kg), SpO2 99.00%.  Physical Exam  Constitutional: He is oriented to person, place, and time and well-developed, well-nourished, and in no distress.  HENT:  Head: Normocephalic.  Eyes: EOM are normal. Pupils are equal, round, and reactive to light.  Cardiovascular: Normal rate, regular rhythm and normal heart sounds. Exam reveals no friction rub.  No murmur heard.  Pulmonary/Chest: Effort normal and breath sounds normal. No respiratory distress. He has no wheezes.  Abdominal: Soft. Bowel sounds are normal. He exhibits no distension. There is no tenderness.  Musculoskeletal: He exhibits no edema.  Neurological: He is alert and oriented to person, place, and time.  Skin: He is not diaphoretic.   Labs:  Lab Results   Component  Value  Date    WBC  8.4  03/04/2013    HGB  15.6  03/04/2013    HCT  46.4  03/04/2013    MCV  95.1  03/04/2013    PLT  247  03/04/2013    No results found for this basename: INR, in the last 72 hours   Recent Labs  Lab  03/04/13 0840   NA  136   K  4.5   CL  103   CO2  24   BUN  15   CREATININE  0.89   CALCIUM  9.4   GLUCOSE  104*    Magnesium   Date  Value  Range  Status   05/08/2011  1.7  1.5 - 2.5 mg/dL  Final    No results found for this basename: CKTOTAL, CKMB, TROPONINI, in the last 72 hours   Recent Labs   03/04/13 0925   TROPIPOC  2.63*    Pro B Natriuretic peptide (BNP)   Date/Time  Value  Range  Status   02/25/2009 4:04 AM  543.0*  0.0 - 100.0 pg/mL  Final   09/08/2008 6:15 AM  574.0*  0.0 - 100.0 pg/mL  Final    Lab Results   Component  Value  Date    CHOL  130  12/03/2012    HDL  27.00*  12/03/2012     LDLCALC  92  12/03/2012    TRIG  55.0  12/03/2012    Lab Results   Component  Value  Date    DDIMER  Value: 8.05 AT THE INHOUSE ESTABLISHED CUTOFF VALUE OF 0.48 ug/mL FEU, THIS ASSAY HAS BEEN DOCUMENTED IN THE LITERATURE TO HAVE A SENSITIVITY AND NEGATIVE PREDICTIVE VALUE OF AT LEAST 98 TO 99%. THE TEST RESULT SHOULD BE CORRELATED WITH AN ASSESSMENT OF THE CLINICAL PROBABILITY OF DVT / VTE.*  02/22/2009    Lipase   Date/Time  Value  Range  Status   02/21/2009 6:34 AM  12  11 - 59 U/L  Final    TSH   Date/Time  Value  Range  Status   03/16/2009 8:35 AM  2.567 Test methodology is 3rd generation TSH 0.350 - 4.500 uIU/mL  Final    Retic Ct Pct   Date/Time  Value  Range  Status   02/23/2009 1:05 PM  0.5  0.4 - 3.1 %  Final    Echo:  10/04/2011 Impression: Upper normal LV size with mild LV hypertrophy. EF 35% with  anteroseptal severe hypokinesis, akinesis of the true apex, and mid to apical inferior akinesis. Normal RV size with mildly decreased systolic function. No significant valvular abnormality.   ECG: NSR at 64 BPM.   Radiology: Dg Chest Portable 1 View  03/04/2013 *RADIOLOGY REPORT* Clinical Data: Defibrillator discharge eight times, history of hypertension, coronary disease post CABG, ischemic cardiomyopathy, CHF PORTABLE CHEST - 1 VIEW Comparison: Portable exam 0829 hours compared to 06/03/2011 Findings: Upper normal heart size post CABG. Right subclavian AICD leads project over right atrium and right ventricle. Mediastinal contours and pulmonary vascularity normal. Lungs clear. No pleural effusion or pneumothorax. No acute osseous findings. IMPRESSION: Post CABG and AICD. No acute abnormalities. Original Report Authenticated By: Ulyses Southward, [name].   ASSESSMENT AND PLAN:   Pt w/ PMHx of CAD s/p 3v CABG 2012 ( + mid-LAD stent s/p MI in 2010), ischemic cardiomyopathy w/ EF 30% and pacer/AICD (placed in 2010), and V-fib arrest in 2010, presents with inappropriate AICD firing due to a  ventricular lead malfunction. The patient is to be admitted for lead revision and seen by Dr. Johney Frame in EP. Continue to measure troponins d/t an elevated POC troponin, most likely d/t AICD firing.  Signed:  Lars Masson, MD  03/04/2013  11:01 AM As above, patient seen and examined. Briefly he is a 57 year old male with past medical history of coronary artery disease status post coronary artery bypass and graft, ischemic cardiomyopathy, prior ventricular fibrillation arrest and now status post ICD who presents with multiple ICD discharges. The patient has been doing well recently. He denies increased dyspnea on exertion, orthopnea, PND, pedal edema, chest pain or palpitations. No syncope. Today he was sitting on his front porch drinking coffee in his ICD fired. There were no preceding symptoms. He was shocked an additional 7 times for a total of 8. He is presently asymptomatic. Electrocardiogram shows sinus rhythm with prior anterior infarct. Potassium 4.5. Troponin 2.63. Interrogation of device shows over sensing with inappropriate shocks. Plan to admit to telemetry. Continue to cycle enzymes. Elevated troponin most likely related to ICD discharge. I will ask electrophysiology to see concerning lead revision. Continue preadmission medications. Olga Millers

## 2013-03-04 NOTE — ED Provider Notes (Signed)
History    CSN: [id number] Arrival date & time 03/04/13  4098  First MD Initiated Contact with Patient 03/04/13 [id number]     Chief Complaint  Patient presents with  . AICD Problem   (Consider location/radiation/quality/duration/timing/severity/associated sxs/prior Treatment) HPI Comments: Pt w/ PMHx of CAD s/p 3v CABG 2012, ischemic cardiomyopathy w/ EF 30% w/ pacer/defibrillator, now w/ AICD firing. States normal state of health this am. Noted defibrillator fired 8 times. Denies prodrome of chest pain, dyspnea, palpitations or syncope. No recent fever or infection or cough. Denies prior hx of AICD malfunction. Pt notes firing a/w movement of right arm  Patient is a 57 y.o. male presenting with general illness. The history is provided by the patient. No language interpreter was used.  Illness Location:  Cardio/pulm Quality:  AICD firing  Severity:  Severe Onset quality:  Sudden Timing:  Intermittent Progression:  Worsening Chronicity:  New Associated symptoms: no abdominal pain, no chest pain, no congestion, no cough, no diarrhea, no fever, no headaches, no nausea, no rash, no shortness of breath, no sore throat and no vomiting    Past Medical History  Diagnosis Date  . CAD (coronary artery disease)     Est. EF of 45% -- Successful intracoronary stenting of the mid-LAD -- Three-vessel atherosclerotic coronary artery disease.  The patient has occlusion of the mid LAD, which is his culprit lesion.  He has  moderate disease in the proximal circumflex and PDA -- Moderate left ventricular dysfunction -- [name], [name]  . Ischemic cardiomyopathy     severe ischemic cardiomyopathy -- ejection fraction 30-35%  . Polymorphic ventricular tachycardia     with VF arrest, s/p subsequent ICD  . Ejection fraction < 50%     30-35%  . HL (hearing loss)   . CHF NYHA class II      New York Heart Association class II/III heart failure  . Adrenal mass     currently being evaluated -- 4.1 cm  right adrenal mass  . Acute anterior myocardial infarction 09/02/2008    post emergent stenting of the LAD in January 2010 -- anterior ST-elevation myocardial infarction  . Hyperlipidemia   . Hypertension   . Cardiac arrest - ventricular fibrillation 09/02/2008    Out of hospital cardiac arrest secondary to arrhythmia  . History of methicillin resistant staphylococcus aureus (MRSA)     History of methicillin-sensitive Staphylococcus aureus bacteremia  with implantable cardioverter-defibrillator explant, February 23, 2009  . CHF (congestive heart failure)   . Pericarditis     history of acute pericarditis in July 2010 now resolved  . Anemia   . S/P CABG (coronary artery bypass graft) Sept 2012   Past Surgical History  Procedure Laterality Date  . Ankle surgery    . Icd implant  02/23/2009     with subsequent extraction for MSSA bacteremia  . Icd reimplantation  05/09/2009    status post implant of a Medtronic Maximo II DR dual- chamber cardioverter defibrillator by Dr. Hillis Range  . Cardiac catheterization  08/26/2008    Est. EF of 45% -- Successful intracoronary stenting of the mid-LAD -- Three-vessel atherosclerotic coronary artery disease.  The patient has occlusion of the mid LAD, which is his culprit lesion.  He has  moderate disease in the proximal circumflex and PDA -- Moderate left ventricular dysfunction -- [name], [name]  . Cardiac catheterization  03/16/2009     EF was approximately 30% -- Atherosclerotic coronary vascular disease, three-vessel --  Moderate-to-severe ischemic cardiomyopathy -- No clear evidence for an acute culprit lesion that would explain the patient's chest pain -- [name], [name].   . Coronary artery bypass graft  05/07/2011    CABG x3:  LIMA to LAD, SVG to OM2, SVG to PDA   Family History  Problem Relation Age of Onset  . Stroke Mother   . Coronary artery disease Father    History  Substance Use Topics  . Smoking status: Current Every Day  Smoker -- 37 years    Types: Cigarettes    Last Attempt to Quit: 12/18/2011  . Smokeless tobacco: Not on file     Comment: 1 pack per week. Has been smoking for 35 years.   . Alcohol Use: No    Review of Systems  Constitutional: Negative for fever and chills.  HENT: Negative for congestion and sore throat.   Respiratory: Negative for cough and shortness of breath.   Cardiovascular: Negative for chest pain and leg swelling.  Gastrointestinal: Negative for nausea, vomiting, abdominal pain, diarrhea and constipation.  Genitourinary: Negative for dysuria and frequency.  Skin: Negative for color change and rash.  Neurological: Negative for dizziness and headaches.  Psychiatric/Behavioral: Negative for confusion and agitation.  All other systems reviewed and are negative.    Allergies  Morphine and related  Home Medications   Current Outpatient Rx  Name  Route  Sig  Dispense  Refill  . aspirin EC 325 MG tablet   Oral   Take 325 mg by mouth daily.           Marland Kitchen atorvastatin (LIPITOR) 80 MG tablet   Oral   Take 80 mg by mouth daily.         . carvedilol (COREG) 25 MG tablet   Oral   Take 1 tablet (25 mg total) by mouth 2 (two) times daily with a meal.   60 tablet   11   . ramipril (ALTACE) 10 MG capsule   Oral   Take 1 capsule (10 mg total) by mouth 2 (two) times daily.   180 capsule   3   . spironolactone (ALDACTONE) 25 MG tablet   Oral   Take 1 tablet (25 mg total) by mouth daily.   30 tablet   11    BP 143/82  Pulse 60  Temp(Src) 98.3 F (36.8 C) (Oral)  Resp 13  Ht 5\' 7"  (1.702 m)  Wt 181 lb (82.101 kg)  BMI 28.34 kg/m2  SpO2 100% Physical Exam  Constitutional: He is oriented to person, place, and time. He appears well-developed and well-nourished. No distress.  HENT:  Head: Normocephalic and atraumatic.  Eyes: EOM are normal. Pupils are equal, round, and reactive to light.  Neck: Normal range of motion. Neck supple.  Cardiovascular: Normal rate  and regular rhythm.     Pulmonary/Chest: Effort normal and breath sounds normal. No respiratory distress.  Abdominal: Soft. He exhibits no distension.  Musculoskeletal: Normal range of motion. He exhibits no edema.  Neurological: He is alert and oriented to person, place, and time.  Skin: Skin is warm and dry.  Psychiatric: He has a normal mood and affect. His behavior is normal.    ED Course  Procedures (including critical care time) Labs Reviewed  CBC WITH DIFFERENTIAL  BASIC METABOLIC PANEL   Date: 03/04/2013  Rate: 64  Rhythm: normal sinus rhythm  QRS Axis: normal  Intervals: normal  ST/T Wave abnormalities: septal Q waves  Conduction Disutrbances:none  Narrative Interpretation:  Old EKG Reviewed: changes noted pacer spike absent   Results for orders placed during the hospital encounter of 03/04/13  CBC WITH DIFFERENTIAL      Result Value Range   WBC 8.4  4.0 - 10.5 K/uL   RBC 4.88  4.22 - 5.81 MIL/uL   Hemoglobin 15.6  13.0 - 17.0 g/dL   HCT 16.1  09.6 - 04.5 %   MCV 95.1  78.0 - 100.0 fL   MCH 32.0  26.0 - 34.0 pg   MCHC 33.6  30.0 - 36.0 g/dL   RDW 40.9  81.1 - 91.4 %   Platelets 247  150 - 400 K/uL   Neutrophils Relative % 66  43 - 77 %   Neutro Abs 5.6  1.7 - 7.7 K/uL   Lymphocytes Relative 21  12 - 46 %   Lymphs Abs 1.8  0.7 - 4.0 K/uL   Monocytes Relative 8  3 - 12 %   Monocytes Absolute 0.7  0.1 - 1.0 K/uL   Eosinophils Relative 4  0 - 5 %   Eosinophils Absolute 0.3  0.0 - 0.7 K/uL   Basophils Relative 0  0 - 1 %   Basophils Absolute 0.0  0.0 - 0.1 K/uL  BASIC METABOLIC PANEL      Result Value Range   Sodium 136  135 - 145 mEq/L   Potassium 4.5  3.5 - 5.1 mEq/L   Chloride 103  96 - 112 mEq/L   CO2 24  19 - 32 mEq/L   Glucose, Bld 104 (*) 70 - 99 mg/dL   BUN 15  6 - 23 mg/dL   Creatinine, Ser 7.82  0.50 - 1.35 mg/dL   Calcium 9.4  8.4 - 95.6 mg/dL   GFR calc non Af Amer >90  >90 mL/min   GFR calc Af Amer >90  >90 mL/min  POCT I-STAT TROPONIN I        Result Value Range   Troponin i, poc 2.63 (*) 0.00 - 0.08 ng/mL   Comment NOTIFIED PHYSICIAN     Comment 3            DG Chest Portable 1 View (Final result)  Result time: 03/04/13 08:42:25    Final result by Rad Results In Interface (03/04/13 08:42:25)    Narrative:   *RADIOLOGY REPORT*  Clinical Data: Defibrillator discharge eight times, history of hypertension, coronary disease post CABG, ischemic cardiomyopathy, CHF  PORTABLE CHEST - 1 VIEW  Comparison: Portable exam 0829 hours compared to 06/03/2011  Findings: Upper normal heart size post CABG. Right subclavian AICD leads project over right atrium and right ventricle. Mediastinal contours and pulmonary vascularity normal. Lungs clear. No pleural effusion or pneumothorax. No acute osseous findings.  IMPRESSION: Post CABG and AICD. No acute abnormalities.   Original Report Authenticated By: Ulyses Southward, [name].          No results found. No diagnosis found.  MDM  Exam as above, concern for AICD malfunction. Possible lead malposition or e-lyte abnormality. Will interrogate, obtain troponin, ECG, CXR, BMP, CBC  Course: CXR - NACPF, troponin 2.63 - likely from AICD firing. Pt is chest pain free. Labs otherwise unremarkable. ECG c/w prior infarcts no acute ischemia. Pacer interrogated - no arrhythmia - concern for intrinsic defib malfunction. D/w cardiology and EP and pt admitted for further work up. Defibrillator did not fire while in ED. Admit in stable condition.   I have personally reviewed labs and imaging and considered in my MDM.  Case d/w Dr Manus Gunning   1. Defibrillator discharge     Audelia Hives, MD 03/04/13 [phone]

## 2013-03-05 ENCOUNTER — Encounter (HOSPITAL_COMMUNITY)
Admission: EM | Disposition: A | Discharge status: Home / Self Care | Payer: Self-pay | Source: Home / Self Care | Attending: Cardiology

## 2013-03-05 DIAGNOSIS — I4901 Ventricular fibrillation: Secondary | ICD-10-CM

## 2013-03-05 HISTORY — PX: LEAD REVISION: SHX5945

## 2013-03-05 LAB — CBC
Hemoglobin: 16.6 g/dL (ref 13.0–17.0)
Platelets: 255 10*3/uL (ref 150–400)
RBC: 5.02 MIL/uL (ref 4.22–5.81)
WBC: 10.4 10*3/uL (ref 4.0–10.5)

## 2013-03-05 LAB — BASIC METABOLIC PANEL
CO2: 23 mEq/L (ref 19–32)
Calcium: 9.7 mg/dL (ref 8.4–10.5)
GFR calc non Af Amer: >90 mL/min (ref >90)
Potassium: 4.4 mEq/L (ref 3.5–5.1)
Sodium: 138 mEq/L (ref 135–145)

## 2013-03-05 LAB — PROTIME-INR
INR: 1.01 (ref 0.00–1.49)
Prothrombin Time: 13.1 seconds (ref 11.6–15.2)

## 2013-03-05 LAB — SURGICAL PCR SCREEN: MRSA, PCR: NEGATIVE

## 2013-03-05 SURGERY — LEAD REVISION
Anesthesia: LOCAL | Laterality: Left

## 2013-03-05 MED ORDER — MIDAZOLAM HCL 5 MG/5ML IJ SOLN
INTRAMUSCULAR | Status: AC
  Filled 2013-03-05: qty 5

## 2013-03-05 MED ORDER — SODIUM CHLORIDE 0.9 % IJ SOLN: 3.0000 mL | INTRAMUSCULAR | Status: DC | PRN

## 2013-03-05 MED ORDER — ACETAMINOPHEN 325 MG PO TABS: 325.0000 mg | ORAL_TABLET | ORAL | Status: DC | PRN

## 2013-03-05 MED ORDER — ONDANSETRON HCL 4 MG/2ML IJ SOLN
4.0000 mg | Freq: Four times a day (QID) | INTRAMUSCULAR | Status: DC | PRN
Start: 2013-03-05 — End: 2013-03-05

## 2013-03-05 MED ORDER — FENTANYL CITRATE 0.05 MG/ML IJ SOLN
INTRAMUSCULAR | Status: AC
  Filled 2013-03-05: qty 2

## 2013-03-05 MED ORDER — DIPHENHYDRAMINE HCL 50 MG/ML IJ SOLN
INTRAMUSCULAR | Status: AC
  Filled 2013-03-05: qty 1

## 2013-03-05 MED ORDER — HYDROCODONE-ACETAMINOPHEN 5-325 MG PO TABS
1.0000 | ORAL_TABLET | ORAL | Status: DC | PRN
  Administered 2013-03-05 (×2): 1 via ORAL
  Administered 2013-03-06: 2 via ORAL
  Filled 2013-03-05: qty 1
  Filled 2013-03-05: qty 2
  Filled 2013-03-05: qty 1

## 2013-03-05 MED ORDER — SODIUM CHLORIDE 0.9 % IV SOLN: 250.0000 mL | INTRAVENOUS | Status: DC | PRN

## 2013-03-05 MED ORDER — CEFAZOLIN SODIUM 1-5 GM-% IV SOLN
1.0000 g | Freq: Four times a day (QID) | INTRAVENOUS | Status: DC
  Filled 2013-03-05 (×2): qty 50

## 2013-03-05 MED ORDER — LIDOCAINE HCL (PF) 1 % IJ SOLN
INTRAMUSCULAR | Status: AC
  Filled 2013-03-05: qty 60

## 2013-03-05 MED ORDER — CEFAZOLIN SODIUM 1-5 GM-% IV SOLN
1.0000 g | Freq: Four times a day (QID) | INTRAVENOUS | Status: AC
  Administered 2013-03-05 – 2013-03-06 (×3): 1 g via INTRAVENOUS
  Filled 2013-03-05 (×3): qty 50

## 2013-03-05 MED ORDER — SODIUM CHLORIDE 0.9 % IJ SOLN
3.0000 mL | Freq: Two times a day (BID) | INTRAMUSCULAR | Status: DC
  Administered 2013-03-05 – 2013-03-06 (×2): 3 mL via INTRAVENOUS

## 2013-03-05 NOTE — Progress Notes (Signed)
ICD Criteria  Current LVEF (within 6 months):35%  NYHA Functional Classification: Class II  Heart Failure History:  No.  Non-Ischemic Dilated Cardiomyopathy History:  No.  Atrial Fibrillation/Atrial Flutter:  No.  Ventricular Tachycardia History:  Prior VF arrest  Cardiac Arrest History:  Yes, This was a Ventricular Tachycardia/Ventricular Fibrillation Arrest. This was NOT a bradycardia arrest.  History of Syndromes with Risk of Sudden Death:  no  Previous ICD:  Yes, ICD Type:  Dual, Reason for ICD:  Secondary, reason for secondary prevention:  Cardiac Arrest.  Electrophysiology Study: No.  Anticoagulation Therapy:  Patient is NOT on anticoagulation therapy.   Beta Blocker Therapy:  Yes.   Ace Inhibitor/ARB Therapy:  Yes.   Reason for procedure:  RV lead fracture with inappropriate ICD shocks.  Lead revision required.

## 2013-03-05 NOTE — Progress Notes (Signed)
Utilization review completed.  

## 2013-03-05 NOTE — Brief Op Note (Signed)
Successful RV lead replacement for RV lead fracture  See dictation [id number]

## 2013-03-05 NOTE — Interval H&P Note (Signed)
History and Physical Interval Note:  03/05/2013 6:35 AM See my full consult note from yesterday. Will proceed with RV lead revision with possible generator change this AM if R subclavian system is patent. [name]  has presented today for surgery, with the diagnosis of Chest Pain  The various methods of treatment have been discussed with the patient and family. After consideration of risks, benefits and other options for treatment, the patient has consented to  Procedure(s): LEAD REVISION (Left) as a surgical intervention .  The patient's history has been reviewed, patient examined, no change in status, stable for surgery.  I have reviewed the patient's chart and labs.  Questions were answered to the patient's satisfaction.     Hillis Range

## 2013-03-05 NOTE — Progress Notes (Signed)
Orthopedic Tech Progress Note Patient Details:  [name] 11/06/1955 [id number] Patient provided arm sling after procedure. No action needed by Ortho Tech Patient ID: [name], male   DOB: 1956-04-18, 57 y.o.   MRN: [id number]   Orie Rout 03/05/2013, 12:39 PM

## 2013-03-05 NOTE — H&P (View-Only) (Signed)
 ELECTROPHYSIOLOGY CONSULT NOTE  Patient ID: [name] MRN: [id number], DOB/AGE: 01/17/1956   Admit date: 03/04/2013 Date of Consult: 03/04/2013  Primary Physician: Cornelius Van Dam, MD Primary Cardiologist / EP: Jordan, MD / Rakayla Ricklefs, MD Reason for Consultation: ICD shocks  History of Present Illness [name] is a 57 y.o. male with an ischemic CM, EF 30-35%, prior VF arrest s/p ICD implant, chronic systolic HF, CAD s/p CABG and prior MRSA bacteremia who presented to [facility] ED after receiving multiple ICD shocks. He states while sitting at home drinking coffee this AM his ICD fired. There were no preceding symptoms. He was shocked an additional 7 times for a total of 8 shocks. He denies CP, SOB, palpitations, dizziness or syncope. He reports he has been "feeling great" and has no complaints. He reports compliance with his medications. He denies HF symptoms, specifically LE swelling, orthopnea or PND. In the ED a [facility] transmission was done. EGMs show RV lead noise which resulted in shocks.   Past Medical History Past Medical History  Diagnosis Date  . CAD (coronary artery disease)     Est. EF of 45% -- Successful intracoronary stenting of the mid-LAD -- Three-vessel atherosclerotic coronary artery disease.  The patient has occlusion of the mid LAD, which is his culprit lesion.  He has  moderate disease in the proximal circumflex and PDA -- Moderate left ventricular dysfunction -- Peter M. Jordan, [name]  . Ischemic cardiomyopathy     severe ischemic cardiomyopathy -- ejection fraction 30-35%  . Polymorphic ventricular tachycardia     with VF arrest, s/p subsequent ICD  . Ejection fraction < 50%     30-35%  . HL (hearing loss)   . CHF NYHA class II      New York Heart Association class II/III heart failure  . Adrenal mass     currently being evaluated -- 4.1 cm right adrenal mass  . Acute anterior myocardial infarction 09/02/2008    post emergent stenting of the LAD in  January 2010 -- anterior ST-elevation myocardial infarction  . Hyperlipidemia   . Hypertension   . Cardiac arrest - ventricular fibrillation 09/02/2008    Out of hospital cardiac arrest secondary to arrhythmia  . History of methicillin resistant staphylococcus aureus (MRSA)     History of methicillin-sensitive Staphylococcus aureus bacteremia  with implantable cardioverter-defibrillator explant, February 23, 2009  . CHF (congestive heart failure)   . Pericarditis     history of acute pericarditis in July 2010 now resolved  . Anemia   . S/P CABG (coronary artery bypass graft) Sept 2012    Past Surgical History Past Surgical History  Procedure Laterality Date  . Ankle surgery    . Icd implant  02/23/2009     with subsequent extraction for MSSA bacteremia  . Icd reimplantation  05/09/2009    status post implant of a Medtronic Maximo II DR dual- chamber cardioverter defibrillator by Dr. Maybree Riling  . Cardiac catheterization  08/26/2008    Est. EF of 45% -- Successful intracoronary stenting of the mid-LAD -- Three-vessel atherosclerotic coronary artery disease.  The patient has occlusion of the mid LAD, which is his culprit lesion.  He has  moderate disease in the proximal circumflex and PDA -- Moderate left ventricular dysfunction -- Peter M. Jordan, [name]  . Cardiac catheterization  03/16/2009     EF was approximately 30% -- Atherosclerotic coronary vascular disease, three-vessel --  Moderate-to-severe ischemic cardiomyopathy -- No clear evidence for an   acute culprit lesion that would explain the patient's chest pain -- John H. Edmunds, [name].   . Coronary artery bypass graft  05/07/2011    CABG x3:  LIMA to LAD, SVG to OM2, SVG to PDA    Allergies/Intolerances Allergies  Allergen Reactions  . Morphine And Related    Inpatient Medications . aspirin  324 mg Oral NOW   Or  . aspirin  300 mg Rectal NOW  . [START ON 03/05/2013] aspirin EC  325 mg Oral Daily  . atorvastatin  80 mg Oral q1800    . carvedilol  25 mg Oral BID WC  . heparin  5,000 Units Subcutaneous Q8H  . ramipril  10 mg Oral BID  . sodium chloride  3 mL Intravenous Q12H  . spironolactone  25 mg Oral Daily   Family History Family History  Problem Relation Age of Onset  . Stroke Mother   . Coronary artery disease Father     Social History History   Social History  . Marital Status: Married    Spouse Name: N/A    Number of Children: 3  . Years of Education: N/A   Occupational History  . mechanic farm equipment    Social History Main Topics  . Smoking status: Current Every Day Smoker -- 37 years    Types: Cigarettes    Last Attempt to Quit: 12/18/2011  . Smokeless tobacco: Not on file     Comment: 1 pack per week. Has been smoking for 35 years.   . Alcohol Use: No  . Drug Use: No  . Sexually Active: Not on file   Other Topics Concern  . Not on file   Social History Narrative   SOCIAL HISTORY:  He is married for 37 years with children.  He smokes 1 pack per day. He has been smoking for 37 years.  He denies alcohol use.  He works as a  mechanic.         FAMILY HISTORY:  Father has a history of coronary artery disease. Mother died in her 70s with a stroke.  He has 2 sisters who are alive and well.     Review of Systems General: No chills, fever, night sweats or weight changes  Cardiovascular:  No chest pain, dyspnea on exertion, edema, orthopnea, palpitations, paroxysmal nocturnal dyspnea Dermatological: No rash, lesions or masses Respiratory: No cough, dyspnea Urologic: No hematuria, dysuria Abdominal: No nausea, vomiting, diarrhea, bright red blood per rectum, melena, or hematemesis Neurologic: No visual changes, weakness, changes in mental status All other systems reviewed and are otherwise negative except as noted above.  Physical Exam Vitals: Blood pressure 133/68, pulse 59, temperature 98.3 F (36.8 C), temperature source Oral, resp. rate 18, height 5' 7" (1.702 m), weight 183 lb 6.4 oz  (83.19 kg), SpO2 98.00%.  General: Well developed, well appearing 57 y.o. male in no acute distress. HEENT: Normocephalic, atraumatic. EOMs intact. Sclera nonicteric. Oropharynx clear.  Neck: Supple without bruits. No JVD. Lungs: Respirations regular and unlabored, CTA bilaterally. No wheezes, rales or rhonchi. Heart: RRR. S1, S2 present. No murmurs, rub, S3 or S4. Abdomen: Soft, non-tender, non-distended. BS present x 4 quadrants. No hepatosplenomegaly.  Extremities: No clubbing, cyanosis or edema. DP/PT/Radials 2+ and equal bilaterally. Psych: Normal affect. Neuro: Alert and oriented X 3. Moves all extremities spontaneously. Musculoskeletal: No kyphosis. Skin: Intact. Warm and dry. No rashes or petechiae in exposed areas.   Labs Troponin 2.63  Lab Results  Component Value Date   WBC   8.4 03/04/2013   HGB 15.6 03/04/2013   HCT 46.4 03/04/2013   MCV 95.1 03/04/2013   PLT 247 03/04/2013    Recent Labs Lab 03/04/13 0840  NA 136  K 4.5  CL 103  CO2 24  BUN 15  CREATININE 0.89  CALCIUM 9.4  GLUCOSE 104*    Radiology/Studies Dg Chest Portable 1 View  03/04/2013   *RADIOLOGY REPORT*  Clinical Data: Defibrillator discharge eight times, history of hypertension, coronary disease post CABG, ischemic cardiomyopathy, CHF  PORTABLE CHEST - 1 VIEW  Comparison: Portable exam 0829 hours compared to 06/03/2011  Findings: Upper normal heart size post CABG. Right subclavian AICD leads project over right atrium and right ventricle. Mediastinal contours and pulmonary vascularity normal. Lungs clear. No pleural effusion or pneumothorax. No acute osseous findings.  IMPRESSION: Post CABG and AICD. No acute abnormalities.   Original Report Authenticated By: Mark Boles, [name].    12-lead ECG shows SR at 64 bpm with normal intervals Telemetry shows SR; one 15-beat run NSVT   Assessment and Plan 1. Probable RV lead fracture   - Device interrogation findings were discussed with Mr. Gunkel and his family.  He will need RV lead revision. The procedure involved including   risks and benefits were reviewed in detail. These risks include, but are not limited to, bleeding, infection, pneumothorax, perforation,   tamponade, vascular damage, renal failure, MI, stroke and death. Mr. Swinford expressed verbal understanding and wishes to proceed. This will   be scheduled for tomorrow, 03/05/2013. 2. Inappropriate ICD shocks, due to #1 3. NSVT  - Asymptomatic. Continue BB. 4. Prior VF arrest 5. Ischemic CM with chronic systolic HF  - Stable without HF symptoms. Continue medical therapy. 6. CAD  - Stable without anginal symptoms. Continue medical therapy.  Dr. Kameka Whan to see Signed, EDMISTEN, BROOKE, PA-C 03/04/2013, 1:59 PM   I have seen, examined the patient, and reviewed the above assessment and plan.  Changes to above are made where necessary.  The patient has had an ICD implanted for secondary prevention following a prior VF arrest in the setting of an ischemic CM.  He continues to have NSVT on telemetry here and is at high risk of sudden death.  I have reviewed his device interrogation at length.  This reveals inappropriate shocks for lead noise.  Though his initial impedances have been stable, his most recent RV lead pace/sense impedance is >3000 confirming lead fracture.  Given his risks for sudden death, I believe that lead replacement is required at this time. Risks, benefits, alternatives to ICD lead revision were discussed in detail with the patient today. The patient  understands that the risks include but are not limited to bleeding, infection, pneumothorax, perforation, tamponade, vascular damage, renal failure, MI, stroke, death, inappropriate shocks, and lead dislodgement and wishes to proceed.  We will therefore schedule device implantation at the next available time.  His elevated troponin is due to cardiac injury related to multiple ICD shocks.  This is not a MI.  Co Sign: Cordarrius Coad,  MD 03/04/2013 10:19 PM   

## 2013-03-05 NOTE — Op Note (Signed)
NAME:  [name], [name] NO.:  [id number]  MEDICAL RECORD NO.:  [id number]  LOCATION:  [facility]                         FACILITY:  [facility]  PHYSICIAN:  Hillis Range, MD       DATE OF BIRTH:  1955-12-13  DATE OF PROCEDURE:  03/05/2013 DATE OF DISCHARGE:                              OPERATIVE REPORT   SURGEON:  Hillis Range, MD  PREPROCEDURE DIAGNOSES: 1. Prior ventricular fibrillation arrest. 2. Ischemic cardiomyopathy. 3. Chronic systolic dysfunction. 4. ICD defibrillator lead fracture with inappropriate shocks     delivered.  POSTPROCEDURE DIAGNOSES: 1. Prior ventricular fibrillation arrest. 2. Ischemic cardiomyopathy. 3. Chronic systolic dysfunction. 4. ICD defibrillator lead fracture with inappropriate shocks     delivered.  PROCEDURES: 1. Right upper extremity venography. 2. Defibrillator pulse generator replacement. 3. New right ventricular defibrillator lead placement. 4. Defibrillation threshold testing.  INTRODUCTION:  [name] is a very pleasant 57 year old gentleman with a history of coronary artery disease and an ischemic cardiomyopathy (ejection fraction 35%).  He has chronic New York Heart Association class 2 congestive heart failure symptoms.  He previously underwent ICD implantation for secondary prevention of sudden cardiac death following successful resuscitation of a ventricular fibrillation arrest for sudden cardiac death.  He reports doing very well until March 04, 2013 when he received 9 ICD shocks.  He reports that he was sitting at that time and at his usual state of health.  He was brought [facility] where he was confirmed to have fracture of his Medtronic [phone] right ventricular lead with inappropriate therapies delivered.  He now presents for a right ventricular lead replacement with a pulse generator change.  DESCRIPTION OF PROCEDURE:  Informed written consent was obtained and the patient was brought to the  electrophysiology lab in the fasting state. His defibrillator was interrogated and the battery longevity was less than 1 year.  His right ventricular lead was observed to have noise with inappropriate shocks delivered.  His right ventricular pace sense impedance was greater than 3000 Ohms.  SIC counters were also elevated. Tachy therapies were programmed off.  The patient was adequately sedated with intravenous medications as outlined in the nursing report.  The patient's right chest was prepped and draped in the usual sterile fashion by the EP lab staff.  His right ventricular defibrillator lead was evaluated thoroughly under fluoroscopy and there was no obvious fracture.  However, there did appear to be an abnormality past the yoke which was felt to possibly represent lead damage within the pocket area. A venogram of the right upper extremity was performed, which revealed stenosis of the distal right subclavian vein.  There were mild collaterals from the right internal jugular vein.  The patient's right chest was infiltrated with lidocaine for local analgesia.  A 4-cm incision was then made over the pocket.  The pocket was exposed using a combination of sharp and blunt dissection.  The existing defibrillator was removed from the pocket.  There was no foreign matter or debris within the pocket.  The defibrillator lead was examined closely and there was really no obvious fracture or abrasion or any troubles with the lead based on visual inspection.  The  lead was capped off and returned to the pocket.  The right axillary vein was cannulated.  A Glidewire was used to traverse the stenotic portion of the distal right subclavian vein.  An 8-French sheath was then advanced through the stenotic area.  A [employer] SG model [id number] (serial [id number]) lead was advanced through the right axillary vein into the right ventricular apical septal position.  In this location,  R-waves measured 5.9 mV with impedance of 534 ohms and a threshold of 0.6 volts at 0.5 milliseconds.  The lead was secured to the pectoralis fascia using #2 silk suture over the suture sleeve.  The previously implanted atrial lead was a Medtronic, model [id number] (serial #[id number]) lead implanted on May 09, 2009.  This atrial lead as well as the newly placed [employer] SG lead were both connected to a Medtronic Evera XT DR, model [id number] (serial #[id number]) defibrillator.  The pocket was revised to accommodate this new device.  The pocket was then irrigated with copious gentamicin solution.  The device was placed into the pocket.  The pocket was then closed in two layers with 2-0 Vicryl suture for the subcutaneous and subcuticular layers.  Steri-Strips and a sterile dressing were then applied.   DFT testing was then performed. Ventricular fibrillation was induced with a T-wave shock.  Adequate sensing of ventricular fibrillation was observed with minimal dropout with a programmed sensitivity of 1.2 mV.  A single 15 joule shock was delivered with an impedance of 73 ohms, the duration of 9.3 seconds which successfully terminated ventricular fibrillation.  The patient remained in sinus rhythm thereafter.  There were no early apparent complications.  CONCLUSIONS: 1. Successful right ventricular lead placement for inappropriate shocks delivered due to Medtronic 6947 right ventricular lead fracture. 2. Successful pulse generator replacement. 3. Defibrillation threshold less or equal to 15 joules. 4. No early apparent complications.     Hillis Range, MD     JA/MEDQ  D:  03/05/2013  T:  03/05/2013  Job:  [id number]  cc:   [name], [name].

## 2013-03-06 ENCOUNTER — Inpatient Hospital Stay (HOSPITAL_COMMUNITY): Payer: Medicare Other

## 2013-03-06 DIAGNOSIS — T82190A Other mechanical complication of cardiac electrode, initial encounter: Secondary | ICD-10-CM | POA: Diagnosis not present

## 2013-03-06 DIAGNOSIS — I469 Cardiac arrest, cause unspecified: Secondary | ICD-10-CM

## 2013-03-06 DIAGNOSIS — I472 Ventricular tachycardia: Secondary | ICD-10-CM | POA: Diagnosis not present

## 2013-03-06 DIAGNOSIS — I509 Heart failure, unspecified: Secondary | ICD-10-CM | POA: Diagnosis not present

## 2013-03-06 DIAGNOSIS — R9389 Abnormal findings on diagnostic imaging of other specified body structures: Secondary | ICD-10-CM | POA: Diagnosis not present

## 2013-03-06 DIAGNOSIS — I5022 Chronic systolic (congestive) heart failure: Secondary | ICD-10-CM | POA: Diagnosis not present

## 2013-03-06 LAB — BASIC METABOLIC PANEL
BUN: 15 mg/dL (ref 6–23)
CO2: 24 mEq/L (ref 19–32)
Calcium: 9.2 mg/dL (ref 8.4–10.5)
Creatinine, Ser: 0.81 mg/dL (ref 0.50–1.35)
GFR calc non Af Amer: >90 mL/min (ref >90)
Glucose, Bld: 103 mg/dL — ABNORMAL HIGH (ref 70–99)
Sodium: 134 mEq/L — ABNORMAL LOW (ref 135–145)

## 2013-03-06 MED ORDER — HYDROCODONE-ACETAMINOPHEN 5-325 MG PO TABS: 1.0000 | ORAL_TABLET | ORAL | Status: DC | PRN

## 2013-03-06 MED ORDER — NITROGLYCERIN 0.4 MG SL SUBL: 0.4000 mg | SUBLINGUAL_TABLET | SUBLINGUAL | Status: DC | PRN

## 2013-03-06 MED ORDER — ACETAMINOPHEN 325 MG PO TABS: 325.0000 mg | ORAL_TABLET | ORAL | Status: DC | PRN

## 2013-03-06 NOTE — Discharge Summary (Signed)
Physician Discharge Summary  Patient ID: [name] MRN: [id number] DOB/AGE: [birth year] 57 y.o.  Admit date: 03/04/2013 Discharge date: 03/06/2013  Primary Discharge Diagnosis: 1. ICD defibrillator lead fracture with inappropriate shocks 2. S/P new right ventricular defibrillator lead placement, with the fibular pulse generator replacement on 03/05/2013 Secondary Discharge Diagnosis: 1. Ischemic CM 2. Systolic Dysfunction EF of 35% 3. Prior Ventricular Arrest   Significant Diagnostic Studies: 1.Right ventricular extremity venography   Consults: None  Hospital Course: As detailed in is a 57 year old patient who presented with ICD lead fracture and inappropriate shocks. The patient has a history of ischemic cardiomyopathy with an EF of 35%. He has chronic New York Heart Association class II congestive heart failure symptoms. He was admitted after receiving 9 ICD shocks. On arrival he was found to have a fracture is Medtronic 6947 right ventricular lead.     On 03/05/2013, the patient was brought to EP lab, Cristal Deer w interrogated and battery longevity was less than one year.His right ventricular defibrillator lead  was evaluated thoroughly under fluoroscopy and there was no obvious  fracture. However, there did appear to be an abnormality past the yoke  which was felt to possibly represent lead damage within the pocket area.  A venogram of the right upper extremity was performed, which revealed  stenosis of the distal right subclavian vein. The previously implanted  atrial lead was a Medtronic, model [id number] (serial #[id number]) lead  implanted on May 09, 2009 as well as the newly placed [employer] SG lead were both connected to a Medtronic  Evera XT DR, model [id number] (serial #[id number]) defibrillator. There were no apparent complications. The patient tolerated procedure well without evidence of bleeding hematoma sinus infection. Chest x-ray on day of  discharge 03/06/2013 demonstrated no evidence of active pulmonary disease, no pneumothorax.    The patient was seen and examined by Dr. Elease Hashimoto and found to be stable for discharge. Follow up appointments and discharge instructions were provided for the patient prior to discharge. New Rx for pain control and NTG sublingual are provided.   Discharge Exam: Blood pressure 148/87, pulse 60, temperature 97.7 F (36.5 C), temperature source Oral, resp. rate 18, height 5\' 7"  (1.702 m), weight 183 lb 6.4 oz (83.19 kg), SpO2 97.00%. Please see rounding note Labs:   Lab Results  Component Value Date   WBC 10.4 03/05/2013   HGB 16.6 03/05/2013   HCT 47.4 03/05/2013   MCV 94.4 03/05/2013   PLT 255 03/05/2013    Recent Labs Lab 03/06/13 0600  NA 134*  K 4.3  CL 101  CO2 24  BUN 15  CREATININE 0.81  CALCIUM 9.2  GLUCOSE 103*       Radiology: Dg Chest 2 View  03/06/2013   *RADIOLOGY REPORT*  Clinical Data: Post ICD revision.  CHEST - 2 VIEW  Comparison: 03/04/2013  Findings: Stable appearance of postoperative changes in the mediastinum.  Cardiac pacemaker with lead tips over the RA and RV regions.  No pneumothorax. Pulmonary hyperinflation.  Normal heart size and pulmonary vascularity.  No focal consolidation in the lungs.  No blunting of costophrenic angles.  Calcification of the aorta.  Mild degenerative changes in the thoracic spine.  IMPRESSION: No evidence of active pulmonary disease.   Original Report Authenticated By: Burman Nieves, [name].   Dg Chest Portable 1 View  03/04/2013   *RADIOLOGY REPORT*  Clinical Data: Defibrillator discharge eight times, history of hypertension, coronary disease post CABG, ischemic cardiomyopathy, CHF  PORTABLE CHEST - 1 VIEW  Comparison: Portable exam 0829 hours compared to 06/03/2011  Findings: Upper normal heart size post CABG. Right subclavian AICD leads project over right atrium and right ventricle. Mediastinal contours and pulmonary vascularity normal. Lungs  clear. No pleural effusion or pneumothorax. No acute osseous findings.  IMPRESSION: Post CABG and AICD. No acute abnormalities.   Original Report Authenticated By: Ulyses Southward, [name].    ZOX:[id number] atrial pacemaker Anterior infarct , age undetermined Rate 60 bpm.  FOLLOW UP PLANS AND APPOINTMENTS Discharge Orders   Future Appointments Provider Department Dept Phone   03/16/2013 1:30 PM [facility] [employer] Main Office [place]) [phone]   04/29/2013 8:45 AM [name], MD [facility] Main Office [place]) [phone]   04/29/2013 9:00 AM [facility] Main Office [place]) [phone]   Future Orders Complete By Expires     Diet - low sodium heart healthy  As directed     Discharge instructions  As directed     Comments:      Call for signs of infection at the site of lead revision.Redness, drainage, pain, or fever.    Increase activity slowly  As directed     Leave dressing on - Keep it clean, dry, and intact until clinic visit  As directed         Medication List         acetaminophen 325 MG tablet  Commonly known as:  TYLENOL  Take 1-2 tablets (325-650 mg total) by mouth every 4 (four) hours as needed.     aspirin EC 325 MG tablet  Take 325 mg by mouth daily.     atorvastatin 80 MG tablet  Commonly known as:  LIPITOR  Take 80 mg by mouth daily.     carvedilol 25 MG tablet  Commonly known as:  COREG  Take 1 tablet (25 mg total) by mouth 2 (two) times daily with a meal.     HYDROcodone-acetaminophen 5-325 MG per tablet  Commonly known as:  NORCO/VICODIN  Take 1-2 tablets by mouth every 4 (four) hours as needed for pain.     nitroGLYCERIN 0.4 MG SL tablet  Commonly known as:  NITROSTAT  Place 1 tablet (0.4 mg total) under the tongue every 5 (five) minutes x 3 doses as needed for chest pain.     ramipril 10 MG capsule  Commonly known as:  ALTACE  Take 1 capsule (10 mg total) by mouth 2 (two) times daily.      spironolactone 25 MG tablet  Commonly known as:  ALDACTONE  Take 1 tablet (25 mg total) by mouth daily.           Follow-up Information   Follow up with [facility] On 03/16/2013. (At 1:30 PM for wound check)    Contact information:   [address] [place] [address] [phone]      Follow up with Hillis Range, MD In 3 months. (Our office will schedule)    Contact information:   [address] [place] [address] [phone]        Time spent with patient to include physician time:35 minutes.  Signed: Joni Reining 03/06/2013, 8:22 AM Co-Sign MD  Attending Note:   The patient was seen and examined.  Agree with assessment and plan as noted above.  Changes made to the above note as needed.  Pt is very stable.  Ready to go home with new lead  and new ICD.  Wound looks good.  Vesta Mixer, Montez Hageman., MD, [facility] 03/08/2013, 2:53 PM

## 2013-03-06 NOTE — Progress Notes (Signed)
PROGRESS NOTE  Subjective:   Pt is doing well.  No complications from lead replacement and ICD gen change  Objective:    Vital Signs:   Temp:  [97.7 F (36.5 C)-98.3 F (36.8 C)] 97.7 F (36.5 C) (07/12 0648) Pulse Rate:  [57-62] 60 (07/12 0800) Resp:  [18] 18 (07/12 0648) BP: (118-148)/(64-100) 148/87 mmHg (07/12 0800) SpO2:  [95 %-100 %] 97 % (07/12 0648)  Last BM Date: 03/05/13   24-hour weight change: Weight change:   Weight trends: Filed Weights   03/04/13 0811 03/04/13 1213  Weight: 181 lb (82.101 kg) 183 lb 6.4 oz (83.19 kg)    Intake/Output:  07/11 0701 - 07/12 0700 In: 480 [P.O.:480] Out: -      Physical Exam: BP 148/87  Pulse 60  Temp(Src) 97.7 F (36.5 C) (Oral)  Resp 18  Ht 5\' 7"  (1.702 m)  Wt 183 lb 6.4 oz (83.19 kg)  BMI 28.72 kg/m2  SpO2 97%  General: Vital signs reviewed and noted.   Head: Normocephalic, atraumatic.  Eyes: conjunctivae/corneas clear.  EOM's intact.   Throat: normal  Neck:  normal.    Lungs:    clear  Heart:  RR, S1 S2,  The ICD wound on upper right subclavicular area looks good. outer bandage removed.   Abdomen:  Soft, non-tender, non-distended    Extremities: No edema   Neurologic: A&O X3, CN II - XII are grossly intact.   Psych: Normal     Labs: BMET:  Recent Labs  03/05/13 0500 03/06/13 0600  NA 138 134*  K 4.4 4.3  CL 101 101  CO2 23 24  GLUCOSE 93 103*  BUN 13 15  CREATININE 0.90 0.81  CALCIUM 9.7 9.2    Liver function tests: No results found for this basename: AST, ALT, ALKPHOS, BILITOT, PROT, ALBUMIN,  in the last 72 hours No results found for this basename: LIPASE, AMYLASE,  in the last 72 hours  CBC:  Recent Labs  03/04/13 0840 03/04/13 1417 03/05/13 0500  WBC 8.4 10.4 10.4  NEUTROABS 5.6  --   --   HGB 15.6 15.2 16.6  HCT 46.4 43.4 47.4  MCV 95.1 94.8 94.4  PLT 247 239 255    Cardiac Enzymes:  Recent Labs  03/04/13 1417 03/04/13 1848 03/05/13 0545  TROPONINI 8.29* 6.25*  3.55*    Coagulation Studies:  Recent Labs  03/05/13 0500  LABPROT 13.1  INR 1.01    Other: No components found with this basename: POCBNP,  No results found for this basename: DDIMER,  in the last 72 hours No results found for this basename: HGBA1C,  in the last 72 hours No results found for this basename: CHOL, HDL, LDLCALC, TRIG, CHOLHDL,  in the last 72 hours No results found for this basename: TSH, T4TOTAL, FREET3, T3FREE, THYROIDAB,  in the last 72 hours No results found for this basename: VITAMINB12, FOLATE, FERRITIN, TIBC, IRON, RETICCTPCT,  in the last 72 hours   Other results:  Tele : A-pacing , ventricular sensing.   Medications:    Infusions:    Scheduled Medications: . aspirin EC  325 mg Oral Daily  . atorvastatin  80 mg Oral q1800  . carvedilol  25 mg Oral BID WC  . ramipril  10 mg Oral BID  . sodium chloride  3 mL Intravenous Q12H  . spironolactone  25 mg Oral Daily    Assessment/ Plan:    1. ICD lead fracture with inappropriate ICD shocks:  He  is s/p lead replacement and new ICD.  Wound looks good.   CXR looks good .  Dc to home today.  Follow up with EP in several weeks.  See DC note.   Disposition: DC to home Length of Stay: 2  Vesta Mixer, Montez Hageman., MD, [facility] 03/06/2013, 8:40 AM Office [phone] Pager [phone]

## 2013-03-16 ENCOUNTER — Ambulatory Visit (INDEPENDENT_AMBULATORY_CARE_PROVIDER_SITE_OTHER): Payer: Medicare Other | Admitting: *Deleted

## 2013-03-16 DIAGNOSIS — I509 Heart failure, unspecified: Secondary | ICD-10-CM

## 2013-03-16 DIAGNOSIS — I469 Cardiac arrest, cause unspecified: Secondary | ICD-10-CM

## 2013-03-16 LAB — ICD DEVICE OBSERVATION
AL IMPEDENCE ICD: 494 Ohm
HV IMPEDENCE: 56 Ohm
RV LEAD IMPEDENCE ICD: 323 Ohm
VENTRICULAR PACING ICD: 0.2 pct

## 2013-03-16 NOTE — Progress Notes (Signed)
Wound check-ICD in office. 

## 2013-03-25 ENCOUNTER — Ambulatory Visit (INDEPENDENT_AMBULATORY_CARE_PROVIDER_SITE_OTHER): Payer: Medicare Other | Admitting: *Deleted

## 2013-03-25 DIAGNOSIS — I428 Other cardiomyopathies: Secondary | ICD-10-CM

## 2013-03-25 NOTE — Progress Notes (Signed)
Wound re-check-ICD in office.  Hematoma resolving.  Follow up with Dr. Johney Frame in October.

## 2013-04-02 ENCOUNTER — Encounter: Payer: Self-pay | Admitting: Internal Medicine

## 2013-04-29 ENCOUNTER — Ambulatory Visit: Payer: Medicare Other | Admitting: Cardiology

## 2013-05-20 ENCOUNTER — Ambulatory Visit: Payer: Medicare Other | Admitting: Cardiology

## 2013-06-02 ENCOUNTER — Ambulatory Visit (INDEPENDENT_AMBULATORY_CARE_PROVIDER_SITE_OTHER): Payer: Medicare Other | Admitting: Cardiology

## 2013-06-02 ENCOUNTER — Encounter: Payer: Self-pay | Admitting: Cardiology

## 2013-06-02 ENCOUNTER — Encounter (INDEPENDENT_AMBULATORY_CARE_PROVIDER_SITE_OTHER): Payer: Self-pay

## 2013-06-02 VITALS — BP 140/88 | HR 86 | Ht 67.0 in | Wt 191.0 lb

## 2013-06-02 DIAGNOSIS — I2510 Atherosclerotic heart disease of native coronary artery without angina pectoris: Secondary | ICD-10-CM | POA: Diagnosis not present

## 2013-06-02 DIAGNOSIS — I5020 Unspecified systolic (congestive) heart failure: Secondary | ICD-10-CM | POA: Diagnosis not present

## 2013-06-02 DIAGNOSIS — E7800 Pure hypercholesterolemia, unspecified: Secondary | ICD-10-CM | POA: Diagnosis not present

## 2013-06-02 DIAGNOSIS — I472 Ventricular tachycardia: Secondary | ICD-10-CM

## 2013-06-02 DIAGNOSIS — F17200 Nicotine dependence, unspecified, uncomplicated: Secondary | ICD-10-CM

## 2013-06-02 NOTE — Patient Instructions (Signed)
Continue your current therapy  I will see you in 6 months.   

## 2013-06-02 NOTE — Progress Notes (Signed)
[name] Date of Birth: 1955-10-20   History of Present Illness: [name] is seen back today for a followup visit. He is status post CABG in September 2012 following an anterior myocardial infarction.. He has a history of cardiac arrest and is status post ICD implant. He continues to smoke. He was admitted in July with an inappropriate ICD shock. He had a lead fracture and underwent revision of his ICD. Since then he has had no recurrent arrhythmias. He denies any chest pain or shortness of breath. He continues to exercise regularly.  Current Outpatient Prescriptions on File Prior to Visit  Medication Sig Dispense Refill  . aspirin EC 325 MG tablet Take 325 mg by mouth daily.        Marland Kitchen atorvastatin (LIPITOR) 80 MG tablet Take 80 mg by mouth daily.      . carvedilol (COREG) 25 MG tablet Take 1 tablet (25 mg total) by mouth 2 (two) times daily with a meal.  60 tablet  11  . ramipril (ALTACE) 10 MG capsule Take 1 capsule (10 mg total) by mouth 2 (two) times daily.  180 capsule  3  . spironolactone (ALDACTONE) 25 MG tablet Take 1 tablet (25 mg total) by mouth daily.  30 tablet  11   No current facility-administered medications on file prior to visit.    Allergies  Allergen Reactions  . Morphine And Related     Past Medical History  Diagnosis Date  . CAD (coronary artery disease)     Est. EF of 45% -- Successful intracoronary stenting of the mid-LAD -- Three-vessel atherosclerotic coronary artery disease.  The patient has occlusion of the mid LAD, which is his culprit lesion.  He has  moderate disease in the proximal circumflex and PDA -- Moderate left ventricular dysfunction -- [name], [name]  . Ischemic cardiomyopathy     severe ischemic cardiomyopathy -- ejection fraction 30-35%  . Polymorphic ventricular tachycardia     with VF arrest, s/p subsequent ICD  . Ejection fraction < 50%     30-35%  . HL (hearing loss)   . CHF NYHA class II      New York Heart  Association class II/III heart failure  . Adrenal mass     currently being evaluated -- 4.1 cm right adrenal mass  . Acute anterior myocardial infarction 09/02/2008    post emergent stenting of the LAD in January 2010 -- anterior ST-elevation myocardial infarction  . Hyperlipidemia   . Hypertension   . Cardiac arrest - ventricular fibrillation 09/02/2008    Out of hospital cardiac arrest secondary to arrhythmia  . History of methicillin resistant staphylococcus aureus (MRSA)     History of methicillin-sensitive Staphylococcus aureus bacteremia  with implantable cardioverter-defibrillator explant, February 23, 2009  . CHF (congestive heart failure)   . Pericarditis     history of acute pericarditis in July 2010 now resolved  . Anemia   . S/P CABG (coronary artery bypass graft) Sept 2012    Past Surgical History  Procedure Laterality Date  . Ankle surgery    . Icd implant  02/23/2009     with subsequent extraction for MSSA bacteremia  . Icd reimplantation  05/09/2009    status post implant of a Medtronic Maximo II DR dual- chamber cardioverter defibrillator by Dr. Hillis [name]  . Cardiac catheterization  08/26/2008    Est. EF of 45% -- Successful intracoronary stenting of the mid-LAD -- Three-vessel atherosclerotic coronary artery disease.  The patient has occlusion of the mid LAD, which is his culprit lesion.  He has  moderate disease in the proximal circumflex and PDA -- Moderate left ventricular dysfunction -- [name], [name]  . Cardiac catheterization  03/16/2009     EF was approximately 30% -- Atherosclerotic coronary vascular disease, three-vessel --  Moderate-to-severe ischemic cardiomyopathy -- No clear evidence for an acute culprit lesion that would explain the patient's chest pain -- [name], [name].   . Coronary artery bypass graft  05/07/2011    CABG x3:  LIMA to LAD, SVG to OM2, SVG to PDA    History  Smoking status  . Current Every Day Smoker -- 37 years  . Types:  Cigarettes  . Last Attempt to Quit: 12/18/2011  Smokeless tobacco  . Not on file    Comment: 1 pack per week. Has been smoking for 35 years.     History  Alcohol Use No    Family History  Problem Relation Age of Onset  . Stroke Mother   . Coronary artery disease Father     Review of Systems: The review of systems is per the HPI.  All other systems were reviewed and are negative.  Physical Exam: BP 140/88  Pulse 86  Ht 5\' 7"  (1.702 m)  Wt 191 lb (86.637 kg)  BMI 29.91 kg/m2 Patient is very pleasant and in no acute distress. Skin is warm and dry. Color is normal.  HEENT is unremarkable. Normocephalic/atraumatic. PERRL. Sclera are nonicteric. Neck is supple. No masses. No JVD. Lungs are clear. Cardiac exam shows a regular rate and rhythm. Sternum looks good. His ICD site has healed well. Abdomen is soft. Extremities are without edema. Gait and ROM are intact. No gross neurologic deficits noted.   LABORATORY DATA:  Lab Results  Component Value Date   WBC 7.0 07/09/2011   HGB 13.8 07/09/2011   HCT 41.2 07/09/2011   PLT 244.0 07/09/2011   GLUCOSE 95 12/03/2012   CHOL 130 12/03/2012   TRIG 55.0 12/03/2012   HDL 27.00* 12/03/2012   LDLDIRECT 217.3 04/04/2011   LDLCALC 92 12/03/2012   ALT 23 12/03/2012   AST 19 12/03/2012   NA 139 12/03/2012   K 4.6 12/03/2012   CL 106 12/03/2012   CREATININE 0.9 12/03/2012   BUN 14 12/03/2012   CO2 26 12/03/2012   TSH 2.567 Test methodology is 3rd generation TSH 03/16/2009   INR 1.48 05/07/2011   HGBA1C  Value: 5.2 (NOTE) The ADA recommends the following therapeutic goal for glycemic control related to Hgb A1c measurement: Goal of therapy: <6.5 Hgb A1c  Reference: American Diabetes Association: Clinical Practice Recommendations 2010, Diabetes Care, 2010, 33: (Suppl  1). 03/16/2009      Assessment / Plan: 1. Coronary disease status post anterior myocardial infarction. Status post CABG in September 2012. Patient is asymptomatic. We'll continue with  aspirin, beta blocker, and statin therapy.  2. Ischemic cardiomyopathy ejection fraction 35%. He is on optimal medical therapy with carvedilol, Altace, and Aldactone. No evidence of volume overload. He has class I symptoms.  3. Tobacco abuse. Encourage smoking cessation.  4. History of cardiac arrest with polymorphic VT. history of inappropriate ICD shock related to lead fracture. Status post revision in July 2014.  5. Hyperlipidemia, under fair control on high-dose Lipitor.  6. Hypertension, blood pressure is satisfactory.

## 2013-06-09 ENCOUNTER — Encounter: Payer: Self-pay | Admitting: Internal Medicine

## 2013-06-24 ENCOUNTER — Encounter: Payer: Medicare Other | Admitting: Internal Medicine

## 2013-07-05 ENCOUNTER — Encounter: Payer: Self-pay | Admitting: Internal Medicine

## 2013-08-09 ENCOUNTER — Ambulatory Visit (INDEPENDENT_AMBULATORY_CARE_PROVIDER_SITE_OTHER): Payer: Medicare Other | Admitting: Internal Medicine

## 2013-08-09 ENCOUNTER — Encounter: Payer: Self-pay | Admitting: Internal Medicine

## 2013-08-09 VITALS — BP 132/88 | HR 84 | Ht 67.0 in | Wt 192.8 lb

## 2013-08-09 DIAGNOSIS — I509 Heart failure, unspecified: Secondary | ICD-10-CM

## 2013-08-09 DIAGNOSIS — I2589 Other forms of chronic ischemic heart disease: Secondary | ICD-10-CM | POA: Diagnosis not present

## 2013-08-09 DIAGNOSIS — I469 Cardiac arrest, cause unspecified: Secondary | ICD-10-CM

## 2013-08-09 DIAGNOSIS — I472 Ventricular tachycardia: Secondary | ICD-10-CM | POA: Diagnosis not present

## 2013-08-09 DIAGNOSIS — Z95810 Presence of automatic (implantable) cardiac defibrillator: Secondary | ICD-10-CM | POA: Diagnosis not present

## 2013-08-09 DIAGNOSIS — I255 Ischemic cardiomyopathy: Secondary | ICD-10-CM

## 2013-08-09 DIAGNOSIS — I5020 Unspecified systolic (congestive) heart failure: Secondary | ICD-10-CM

## 2013-08-09 LAB — MDC_IDC_ENUM_SESS_TYPE_INCLINIC
Brady Statistic AP VS Percent: 47.36 %
Brady Statistic AS VP Percent: 0.03 %
Brady Statistic AS VS Percent: 52.59 %
Date Time Interrogation Session: 20141215130918
HighPow Impedance: 57 Ohm
HighPow Impedance: 72 Ohm
Lead Channel Impedance Value: 437 Ohm
Lead Channel Pacing Threshold Amplitude: 1 V
Lead Channel Pacing Threshold Pulse Width: 0.4 ms
Lead Channel Sensing Intrinsic Amplitude: 1.25 mV
Lead Channel Sensing Intrinsic Amplitude: 6.375 mV
Lead Channel Setting Pacing Amplitude: 2.5 V
Lead Channel Setting Pacing Amplitude: 2.5 V
Lead Channel Setting Sensing Sensitivity: 0.3 mV
Zone Setting Detection Interval: 300 ms
Zone Setting Detection Interval: 330 ms
Zone Setting Detection Interval: 350 ms

## 2013-08-09 NOTE — Progress Notes (Signed)
PCP: Julian Hy, MD Primary Cardiologist:  [name]  Dudley E Dopson is a 57 y.o. male who presents today for routine electrophysiology followup.  Since his ICD system revision for lead fracture and ICD shocks, the patient reports doing very well.  Today, he denies symptoms of palpitations, chest pain, shortness of breath,  lower extremity edema, dizziness, presyncope, syncope, or ICD shocks.  The patient is otherwise without complaint today.   Past Medical History  Diagnosis Date  . CAD (coronary artery disease)     Est. EF of 45% -- Successful intracoronary stenting of the mid-LAD -- Three-vessel atherosclerotic coronary artery disease.  The patient has occlusion of the mid LAD, which is his culprit lesion.  He has  moderate disease in the proximal circumflex and PDA -- Moderate left ventricular dysfunction -- [name], [name]  . Ischemic cardiomyopathy     severe ischemic cardiomyopathy -- ejection fraction 30-35%  . Polymorphic ventricular tachycardia     with VF arrest, s/p subsequent ICD  . Ejection fraction < 50%     30-35%  . HL (hearing loss)   . CHF NYHA class II      New York Heart Association class II/III heart failure  . Adrenal mass     currently being evaluated -- 4.1 cm right adrenal mass  . Acute anterior myocardial infarction 09/02/2008    post emergent stenting of the LAD in January 2010 -- anterior ST-elevation myocardial infarction  . Hyperlipidemia   . Hypertension   . Cardiac arrest - ventricular fibrillation 09/02/2008    Out of hospital cardiac arrest secondary to arrhythmia  . History of methicillin resistant staphylococcus aureus (MRSA)     History of methicillin-sensitive Staphylococcus aureus bacteremia  with implantable cardioverter-defibrillator explant, February 23, 2009  . CHF (congestive heart failure)   . Pericarditis     history of acute pericarditis in July 2010 now resolved  . Anemia   . S/P CABG (coronary artery bypass graft) Sept 2012    Past Surgical History  Procedure Laterality Date  . Ankle surgery    . Icd implant  02/23/2009     with subsequent extraction for MSSA bacteremia  . Icd reimplantation  05/09/2009    status post implant of a Medtronic Maximo II DR dual- chamber cardioverter defibrillator by Dr. Hillis Range  . Cardiac catheterization  08/26/2008    Est. EF of 45% -- Successful intracoronary stenting of the mid-LAD -- Three-vessel atherosclerotic coronary artery disease.  The patient has occlusion of the mid LAD, which is his culprit lesion.  He has  moderate disease in the proximal circumflex and PDA -- Moderate left ventricular dysfunction -- [name], [name]  . Cardiac catheterization  03/16/2009     EF was approximately 30% -- Atherosclerotic coronary vascular disease, three-vessel --  Moderate-to-severe ischemic cardiomyopathy -- No clear evidence for an acute culprit lesion that would explain the patient's chest pain -- [name], [name].   . Coronary artery bypass graft  05/07/2011    CABG x3:  LIMA to LAD, SVG to OM2, SVG to PDA  . Implantable cardioverter defibrillator revision  03/05/13    RV lead fracture.  A new [employer] [id number] lead was placed    Current Outpatient Prescriptions  Medication Sig Dispense Refill  . aspirin EC 325 MG tablet Take 325 mg by mouth daily.        Marland Kitchen atorvastatin (LIPITOR) 80 MG tablet Take 80 mg by mouth daily.      Marland Kitchen  carvedilol (COREG) 25 MG tablet Take 1 tablet (25 mg total) by mouth 2 (two) times daily with a meal.  60 tablet  11  . nitroGLYCERIN (NITROSTAT) 0.4 MG SL tablet Place 0.4 mg under the tongue every 5 (five) minutes x 3 doses as needed for chest pain. IF NEEDED      . ramipril (ALTACE) 10 MG capsule Take 1 capsule (10 mg total) by mouth 2 (two) times daily.  180 capsule  3  . spironolactone (ALDACTONE) 25 MG tablet Take 1 tablet (25 mg total) by mouth daily.  30 tablet  11   No current facility-administered medications for this visit.     Physical Exam: Filed Vitals:   08/09/13 0938  BP: 132/88  Pulse: 84  Height: 5\' 7"  (1.702 m)  Weight: 192 lb 12.8 oz (87.454 kg)    GEN- The patient is well appearing, alert and oriented x 3 today.   Head- normocephalic, atraumatic Eyes-  Sclera clear, conjunctiva pink Ears- hearing intact Oropharynx- clear Lungs- Clear to ausculation bilaterally, normal work of breathing Chest- R sided ICD pocket is well healed Heart- Regular rate and rhythm, no murmurs, rubs or gallops, PMI not laterally displaced GI- soft, NT, ND, + BS Extremities- no clubbing, cyanosis, or edema  ICD interrogation- reviewed in detail today,  See PACEART report  Assessment and Plan:  1.  Chronic systolic dysfunction euvolemic today Stable on an appropriate medical regimen Normal ICD function See Pace Art report No changes today  [facility] We will follow his Optivol every month in our ICM device clinic

## 2013-08-09 NOTE — Patient Instructions (Signed)
Remote monitoring is used to monitor your ICD from home. This monitoring reduces the number of office visits required to check your device to one time per year. It allows [place] to keep an eye on the functioning of your device to ensure it is working properly. You are scheduled for a device check from home on 11-10-2013. You may send your transmission at any time that day. If you have a wireless device, the transmission will be sent automatically. After your physician reviews your transmission, you will receive a postcard with your next transmission date.  Your physician recommends that you schedule a follow-up appointment in: 12 MONTHS

## 2013-08-16 ENCOUNTER — Encounter: Payer: Self-pay | Admitting: Internal Medicine

## 2013-08-30 ENCOUNTER — Telehealth: Payer: Self-pay | Admitting: *Deleted

## 2013-08-30 NOTE — Telephone Encounter (Signed)
I left a message for the patient to call and confirm if he received his cell phone adapter for his transmitter.

## 2013-08-31 ENCOUNTER — Telehealth: Payer: Self-pay | Admitting: Internal Medicine

## 2013-08-31 NOTE — Telephone Encounter (Signed)
Previous encounter already open. Will close this encounter. 

## 2013-08-31 NOTE — Telephone Encounter (Signed)
New message ° ° ° ° ° °Pt returning nurses' phone call °

## 2013-08-31 NOTE — Telephone Encounter (Signed)
I spoke with the patient. He did receive his adapter for his transmitter.

## 2013-08-31 NOTE — Telephone Encounter (Signed)
Shanice L Simms at 08/31/2013 12:55 PM     Status: Signed        New message  Pt returning nurses' phone call

## 2013-09-13 ENCOUNTER — Ambulatory Visit (INDEPENDENT_AMBULATORY_CARE_PROVIDER_SITE_OTHER): Payer: Medicare Other | Admitting: *Deleted

## 2013-09-13 DIAGNOSIS — I509 Heart failure, unspecified: Secondary | ICD-10-CM | POA: Diagnosis not present

## 2013-09-13 DIAGNOSIS — Z95810 Presence of automatic (implantable) cardiac defibrillator: Secondary | ICD-10-CM

## 2013-09-16 NOTE — Progress Notes (Addendum)
EPIC Encounter for ICM Monitoring  Patient Name: [name] is a 58 y.o. male Date: 09/16/2013 Primary Care Physican: Julian HySOUTH,STEPHEN ALAN, MD Primary Cardiologist: [place] Electrophysiologist: Allred Dry Weight: 181 lbs       In the past month, have you:  1. Gained more than 2 pounds in a day or more than 5 pounds in a week? no  2. Had changes in your medications (with verification of current medications)? no  3. Had more shortness of breath than is usual for you? no  4. Limited your activity because of shortness of breath? no  5. Not been able to sleep because of shortness of breath? no  6. Had increased swelling in your feet or ankles? no  7. Had symptoms of dehydration (dizziness, dry mouth, increased thirst, decreased urine output) no  8. Had changes in sodium restriction? no  9. Been compliant with medication? Yes  ** The patient reports his device fired about 8 times 6 months ago. He states he has not felt quite right since then, but is unable to describe exactly how he feels. He states at times he feels like he has "stress" across his chest. I question if there is possibly an anxiety component related to previous firing of the device. **  ICM trend:   Follow-up plan: ICM clinic phone appointment : 10/18/13  Copy of note sent to patient's primary care physician, primary cardiologist, and device following physician.  Sherri RadHeather McGhee, RN, BSN 09/16/2013 9:40 AM  Addendum He received multiple ICD shocks 7/14 in the setting of a lead fracture.  This certainly can lead to anxiety disorders and at times requires medical therapy.  I am happy to see him in the office to talk about this if you think this would be helpful.  Otherwise we will continue our current therapy.

## 2013-10-18 ENCOUNTER — Ambulatory Visit (INDEPENDENT_AMBULATORY_CARE_PROVIDER_SITE_OTHER): Payer: Medicare Other | Admitting: *Deleted

## 2013-10-18 DIAGNOSIS — I509 Heart failure, unspecified: Secondary | ICD-10-CM

## 2013-10-18 DIAGNOSIS — Z95810 Presence of automatic (implantable) cardiac defibrillator: Secondary | ICD-10-CM | POA: Diagnosis not present

## 2013-10-18 NOTE — Progress Notes (Signed)
EPIC Encounter for ICM Monitoring  Patient Name: Billy JacquetRobert E Yeoman is a 58 y.o. male Date: 10/18/2013 Primary Care Physican: Julian HySOUTH,STEPHEN ALAN, MD Primary Cardiologist: [place] Electrophysiologist: Allred Dry Weight: 181 lbs       In the past month, have you:  1. Gained more than 2 pounds in a day or more than 5 pounds in a week? no  2. Had changes in your medications (with verification of current medications)? no  3. Had more shortness of breath than is usual for you? no  4. Limited your activity because of shortness of breath? no  5. Not been able to sleep because of shortness of breath? no  6. Had increased swelling in your feet or ankles? no  7. Had symptoms of dehydration (dizziness, dry mouth, increased thirst, decreased urine output) no  8. Had changes in sodium restriction? no  9. Been compliant with medication? Yes   ICM trend:   Follow-up plan: ICM clinic phone appointment: 11/18/13 (full transmission)  Copy of note sent to patient's primary care physician, primary cardiologist, and device following physician.  Sherri RadHeather Kachina Niederer, RN, BSN 10/18/2013 3:32 PM

## 2013-10-27 ENCOUNTER — Other Ambulatory Visit: Payer: Self-pay

## 2013-10-27 MED ORDER — SPIRONOLACTONE 25 MG PO TABS: 25.0000 mg | ORAL_TABLET | Freq: Every day | ORAL | Status: DC

## 2013-10-29 ENCOUNTER — Other Ambulatory Visit: Payer: Self-pay | Admitting: Cardiovascular Disease

## 2013-11-01 ENCOUNTER — Other Ambulatory Visit: Payer: Self-pay | Admitting: Internal Medicine

## 2013-11-18 ENCOUNTER — Ambulatory Visit (INDEPENDENT_AMBULATORY_CARE_PROVIDER_SITE_OTHER): Payer: Medicare Other | Admitting: *Deleted

## 2013-11-18 DIAGNOSIS — I5020 Unspecified systolic (congestive) heart failure: Secondary | ICD-10-CM

## 2013-11-18 DIAGNOSIS — Z95810 Presence of automatic (implantable) cardiac defibrillator: Secondary | ICD-10-CM

## 2013-11-18 DIAGNOSIS — I472 Ventricular tachycardia: Secondary | ICD-10-CM | POA: Diagnosis not present

## 2013-11-18 DIAGNOSIS — I509 Heart failure, unspecified: Secondary | ICD-10-CM | POA: Diagnosis not present

## 2013-11-18 DIAGNOSIS — I4729 Other ventricular tachycardia: Secondary | ICD-10-CM

## 2013-11-18 DIAGNOSIS — I469 Cardiac arrest, cause unspecified: Secondary | ICD-10-CM

## 2013-11-19 LAB — MDC_IDC_ENUM_SESS_TYPE_REMOTE
Brady Statistic AP VP Percent: 0.1 % — CL
Brady Statistic AP VS Percent: 75.1 %
Brady Statistic AS VP Percent: 0.1 % — CL
HIGH POWER IMPEDANCE MEASURED VALUE: 63 Ohm
Lead Channel Impedance Value: 437 Ohm
Lead Channel Pacing Threshold Amplitude: 1.125 V
Lead Channel Pacing Threshold Pulse Width: 0.4 ms
Lead Channel Pacing Threshold Pulse Width: 0.4 ms
Lead Channel Sensing Intrinsic Amplitude: 1.6 mV
Lead Channel Setting Pacing Amplitude: 2.5 V
MDC IDC MSMT LEADCHNL RV IMPEDANCE VALUE: 323 Ohm
MDC IDC MSMT LEADCHNL RV PACING THRESHOLD AMPLITUDE: 1.125 V
MDC IDC MSMT LEADCHNL RV SENSING INTR AMPL: 5.9 mV
MDC IDC SET LEADCHNL RA PACING AMPLITUDE: 2.25 V
MDC IDC SET LEADCHNL RV PACING PULSEWIDTH: 0.4 ms
MDC IDC SET LEADCHNL RV SENSING SENSITIVITY: 0.3 mV
MDC IDC SET ZONE DETECTION INTERVAL: 360 ms
MDC IDC STAT BRADY AS VS PERCENT: 24.9 %
Zone Setting Detection Interval: 300 ms
Zone Setting Detection Interval: 330 ms
Zone Setting Detection Interval: 350 ms

## 2013-11-22 NOTE — Addendum Note (Signed)
Addended by: Sherri RadMCGHEE, HEATHER C on: 11/22/2013 02:10 PM   Modules accepted: Level of Service

## 2013-11-22 NOTE — Progress Notes (Addendum)
EPIC Encounter for ICM Monitoring  Patient Name: Billy JacquetRobert E Line is a 58 y.o. male Date: 11/22/2013 Primary Care Physican: Julian HySOUTH,STEPHEN ALAN, MD Primary Cardiologist: [place] Electrophysiologist: Allred Dry Weight: 181 lbs       In the past month, have you:  1. Gained more than 2 pounds in a day or more than 5 pounds in a week? no  2. Had changes in your medications (with verification of current medications)? no  3. Had more shortness of breath than is usual for you? No. The patient states he did have one episode over the last month that he experienced a pressure sensation to his chest that he thought might be related to some fluid accumulation, but this was not sustained.  4. Limited your activity because of shortness of breath? no  5. Not been able to sleep because of shortness of breath? no  6. Had increased swelling in your feet or ankles? no  7. Had symptoms of dehydration (dizziness, dry mouth, increased thirst, decreased urine output) no  8. Had changes in sodium restriction? no  9. Been compliant with medication? Yes   ICM trend:   Follow-up plan: ICM clinic phone appointment: 12/23/13  Copy of note sent to patient's primary care physician, primary cardiologist, and device following physician.  Sherri RadHeather Leda [place], RN, BSN 11/22/2013 1:56 PM

## 2013-11-29 ENCOUNTER — Other Ambulatory Visit: Payer: Self-pay | Admitting: *Deleted

## 2013-11-29 MED ORDER — ATORVASTATIN CALCIUM 80 MG PO TABS: ORAL_TABLET | ORAL | Status: DC

## 2013-12-03 ENCOUNTER — Encounter: Payer: Self-pay | Admitting: *Deleted

## 2013-12-08 ENCOUNTER — Encounter: Payer: Self-pay | Admitting: Cardiology

## 2013-12-08 ENCOUNTER — Ambulatory Visit (INDEPENDENT_AMBULATORY_CARE_PROVIDER_SITE_OTHER): Payer: Medicare Other | Admitting: Cardiology

## 2013-12-08 VITALS — BP 112/72 | HR 79 | Ht 67.0 in | Wt 184.4 lb

## 2013-12-08 DIAGNOSIS — I5020 Unspecified systolic (congestive) heart failure: Secondary | ICD-10-CM | POA: Diagnosis not present

## 2013-12-08 DIAGNOSIS — Z951 Presence of aortocoronary bypass graft: Secondary | ICD-10-CM

## 2013-12-08 DIAGNOSIS — F17200 Nicotine dependence, unspecified, uncomplicated: Secondary | ICD-10-CM

## 2013-12-08 DIAGNOSIS — I2510 Atherosclerotic heart disease of native coronary artery without angina pectoris: Secondary | ICD-10-CM | POA: Diagnosis not present

## 2013-12-08 DIAGNOSIS — E7800 Pure hypercholesterolemia, unspecified: Secondary | ICD-10-CM | POA: Diagnosis not present

## 2013-12-08 DIAGNOSIS — I10 Essential (primary) hypertension: Secondary | ICD-10-CM

## 2013-12-08 MED ORDER — NITROGLYCERIN 0.4 MG SL SUBL: 0.4000 mg | SUBLINGUAL_TABLET | SUBLINGUAL | Status: AC | PRN

## 2013-12-08 NOTE — Progress Notes (Signed)
[name] Date of Birth: 14-Aug-1956   History of Present Illness: Mr. [name] is seen back today for a followup visit. He is status post CABG in September 2012 following an anterior myocardial infarction.. He has a history of cardiac arrest and is status post ICD implant. He continues to smoke.  He denies any chest pain or shortness of breath. He continues to exercise regularly. He has no plans to quit smoking. No edema or orthopnea. Feels well overall.   Current Outpatient Prescriptions on File Prior to Visit  Medication Sig Dispense Refill  . aspirin EC 325 MG tablet Take 325 mg by mouth daily.        Marland Kitchen. atorvastatin (LIPITOR) 80 MG tablet TAKE 1 TABLET BY MOUTH EVERY NIGHT AT BEDTIME  30 tablet  3  . carvedilol (COREG) 25 MG tablet TAKE 1 TABLET BY MOUTH TWICE DAILY WITH A MEAL  60 tablet  5  . ramipril (ALTACE) 10 MG capsule Take 1 capsule (10 mg total) by mouth 2 (two) times daily.  180 capsule  3  . spironolactone (ALDACTONE) 25 MG tablet Take 1 tablet (25 mg total) by mouth daily.  30 tablet  6   No current facility-administered medications on file prior to visit.    Allergies  Allergen Reactions  . Morphine And Related     Past Medical History  Diagnosis Date  . CAD (coronary artery disease)     Est. EF of 45% -- Successful intracoronary stenting of the mid-LAD -- Three-vessel atherosclerotic coronary artery disease.  The patient has occlusion of the mid LAD, which is his culprit lesion.  He has  moderate disease in the proximal circumflex and PDA -- Moderate left ventricular dysfunction -- Peter M. [place], [name]  . Ischemic cardiomyopathy     severe ischemic cardiomyopathy -- ejection fraction 30-35%  . Polymorphic ventricular tachycardia     with VF arrest, s/p subsequent ICD  . Ejection fraction < 50%     30-35%  . HL (hearing loss)   . CHF NYHA class II      New York Heart Association class II/III heart failure  . Adrenal mass     currently being evaluated --  4.1 cm right adrenal mass  . Acute anterior myocardial infarction 09/02/2008    post emergent stenting of the LAD in January 2010 -- anterior ST-elevation myocardial infarction  . Hyperlipidemia   . Hypertension   . Cardiac arrest - ventricular fibrillation 09/02/2008    Out of hospital cardiac arrest secondary to arrhythmia  . History of methicillin resistant staphylococcus aureus (MRSA)     History of methicillin-sensitive Staphylococcus aureus bacteremia  with implantable cardioverter-defibrillator explant, February 23, 2009  . CHF (congestive heart failure)   . Pericarditis     history of acute pericarditis in July 2010 now resolved  . Anemia   . S/P CABG (coronary artery bypass graft) Sept 2012    Past Surgical History  Procedure Laterality Date  . Ankle surgery    . Icd implant  02/23/2009     with subsequent extraction for MSSA bacteremia  . Icd reimplantation  05/09/2009    status post implant of a Medtronic Maximo II DR dual- chamber cardioverter defibrillator by Dr. Hillis RangeJames Allred  . Cardiac catheterization  08/26/2008    Est. EF of 45% -- Successful intracoronary stenting of the mid-LAD -- Three-vessel atherosclerotic coronary artery disease.  The patient has occlusion of the mid LAD, which is his culprit lesion.  He has  moderate disease in the proximal circumflex and PDA -- Moderate left ventricular dysfunction -- Peter M. [place], [name]  . Cardiac catheterization  03/16/2009     EF was approximately 30% -- Atherosclerotic coronary vascular disease, three-vessel --  Moderate-to-severe ischemic cardiomyopathy -- No clear evidence for an acute culprit lesion that would explain the patient's chest pain -- [name], [name].   . Coronary artery bypass graft  05/07/2011    CABG x3:  LIMA to LAD, SVG to OM2, SVG to PDA  . Implantable cardioverter defibrillator revision  03/05/13    RV lead fracture.  A new [employer] [id number] lead was placed    History  Smoking status  . Current  Every Day Smoker -- 37 years  . Types: Cigarettes  . Last Attempt to Quit: 12/18/2011  Smokeless tobacco  . Not on file    Comment: 1 pack per week. Has been smoking for 35 years.     History  Alcohol Use No    Family History  Problem Relation Age of Onset  . Stroke Mother   . Coronary artery disease Father     Review of Systems: The review of systems is per the HPI.  Weight is down 7 lbs. All other systems were reviewed and are negative.  Physical Exam: BP 112/72  Pulse 79  Ht 5\' 7"  (1.702 m)  Wt 184 lb 6.4 oz (83.643 kg)  BMI 28.87 kg/m2  SpO2 98% Patient is very pleasant and in no acute distress. Skin is warm and dry. Color is normal.  HEENT is unremarkable. Normocephalic/atraumatic. PERRL. Sclera are nonicteric. Neck is supple. No masses. No JVD. Lungs are clear. Cardiac exam shows a regular rate and rhythm. Sternum looks good. His ICD site has healed well. Abdomen is soft. Extremities are without edema. Gait and ROM are intact. No gross neurologic deficits noted.   LABORATORY DATA:  Lab Results  Component Value Date   WBC 7.0 07/09/2011   HGB 13.8 07/09/2011   HCT 41.2 07/09/2011   PLT 244.0 07/09/2011   GLUCOSE 95 12/03/2012   CHOL 130 12/03/2012   TRIG 55.0 12/03/2012   HDL 27.00* 12/03/2012   LDLDIRECT 217.3 04/04/2011   LDLCALC 92 12/03/2012   ALT 23 12/03/2012   AST 19 12/03/2012   NA 139 12/03/2012   K 4.6 12/03/2012   CL 106 12/03/2012   CREATININE 0.9 12/03/2012   BUN 14 12/03/2012   CO2 26 12/03/2012   TSH 2.567 Test methodology is 3rd generation TSH 03/16/2009   INR 1.48 05/07/2011   HGBA1C  Value: 5.2 (NOTE) The ADA recommends the following therapeutic goal for glycemic control related to Hgb A1c measurement: Goal of therapy: <6.5 Hgb A1c  Reference: American Diabetes Association: Clinical Practice Recommendations 2010, Diabetes Care, 2010, 33: (Suppl  1). 03/16/2009      Assessment / Plan: 1. Coronary disease status post anterior myocardial infarction.  Status post CABG in September 2012. Patient is asymptomatic. We'll continue with aspirin, beta blocker, and statin therapy.  2. Ischemic cardiomyopathy ejection fraction 35%. He is on optimal medical therapy with carvedilol, Altace, and Aldactone. No evidence of volume overload. He has class I symptoms.  3. Tobacco abuse. Encourage smoking cessation.  4. History of cardiac arrest with polymorphic VT. history of inappropriate ICD shock related to lead fracture. Status post revision in July 2014. Most recent ICD check was satisfactory.  5. Hyperlipidemia, under control on high-dose Lipitor.  6. Hypertension, blood pressure is well  controlled.

## 2013-12-08 NOTE — Patient Instructions (Signed)
Continue your current therapy  I will see you in 6 months.   

## 2013-12-13 ENCOUNTER — Encounter: Payer: Self-pay | Admitting: Internal Medicine

## 2013-12-17 ENCOUNTER — Other Ambulatory Visit: Payer: Self-pay

## 2013-12-17 MED ORDER — RAMIPRIL 10 MG PO CAPS: 10.0000 mg | ORAL_CAPSULE | Freq: Two times a day (BID) | ORAL | Status: DC

## 2013-12-23 ENCOUNTER — Ambulatory Visit (INDEPENDENT_AMBULATORY_CARE_PROVIDER_SITE_OTHER): Payer: Medicare Other | Admitting: *Deleted

## 2013-12-23 ENCOUNTER — Encounter: Payer: Self-pay | Admitting: *Deleted

## 2013-12-23 DIAGNOSIS — I509 Heart failure, unspecified: Secondary | ICD-10-CM

## 2013-12-23 DIAGNOSIS — Z95810 Presence of automatic (implantable) cardiac defibrillator: Secondary | ICD-10-CM | POA: Diagnosis not present

## 2013-12-23 NOTE — Progress Notes (Signed)
EPIC Encounter for ICM Monitoring  Patient Name: [name] is a 58 y.o. male Date: 12/23/2013 Primary Care Physican: Julian HySOUTH,STEPHEN ALAN, MD Primary Cardiologist: [place] Electrophysiologist: Allred Dry Weight: 181 lbs       In the past month, have you:  1. Gained more than 2 pounds in a day or more than 5 pounds in a week? no  2. Had changes in your medications (with verification of current medications)? no  3. Had more shortness of breath than is usual for you? no  4. Limited your activity because of shortness of breath? no  5. Not been able to sleep because of shortness of breath? no  6. Had increased swelling in your feet or ankles? no  7. Had symptoms of dehydration (dizziness, dry mouth, increased thirst, decreased urine output) no  8. Had changes in sodium restriction? no  9. Been compliant with medication? Yes   ICM trend:   Follow-up plan: ICM clinic phone appointment: 01/24/14  Copy of note sent to patient's primary care physician, primary cardiologist, and device following physician.  Jefferey PicaHeather C Arnol Mcgibbon, RN, BSN 12/23/2013 11:13 AM

## 2014-01-24 ENCOUNTER — Ambulatory Visit (INDEPENDENT_AMBULATORY_CARE_PROVIDER_SITE_OTHER): Payer: Medicare Other | Admitting: *Deleted

## 2014-01-24 ENCOUNTER — Telehealth: Payer: Self-pay | Admitting: *Deleted

## 2014-01-24 DIAGNOSIS — I509 Heart failure, unspecified: Secondary | ICD-10-CM

## 2014-01-24 DIAGNOSIS — Z95810 Presence of automatic (implantable) cardiac defibrillator: Secondary | ICD-10-CM

## 2014-01-24 NOTE — Telephone Encounter (Signed)
I left a message for the patient to call regarding ICM transmission. 

## 2014-01-27 ENCOUNTER — Encounter: Payer: Self-pay | Admitting: *Deleted

## 2014-01-27 NOTE — Telephone Encounter (Signed)
I spoke with the patient. 

## 2014-01-27 NOTE — Telephone Encounter (Signed)
I left a message for the patient to call. 

## 2014-01-27 NOTE — Progress Notes (Signed)
EPIC Encounter for ICM Monitoring  Patient Name: [name] is a 58 y.o. male Date: 01/27/2014 Primary Care Physican: Julian Hy, MD Primary Cardiologist: [place] Electrophysiologist: Allred Dry Weight: 181 lbs       In the past month, have you:  1. Gained more than 2 pounds in a day or more than 5 pounds in a week? no  2. Had changes in your medications (with verification of current medications)? no  3. Had more shortness of breath than is usual for you? no  4. Limited your activity because of shortness of breath? no  5. Not been able to sleep because of shortness of breath? no  6. Had increased swelling in your feet or ankles? no  7. Had symptoms of dehydration (dizziness, dry mouth, increased thirst, decreased urine output) no  8. Had changes in sodium restriction? no  9. Been compliant with medication? Yes   ICM trend:   Follow-up plan: ICM clinic phone appointment: 02/28/14  Copy of note sent to patient's primary care physician, primary cardiologist, and device following physician.  Jefferey Pica, RN, BSN 01/27/2014 4:04 PM

## 2014-02-28 ENCOUNTER — Encounter: Payer: Medicare Other | Admitting: *Deleted

## 2014-02-28 ENCOUNTER — Telehealth: Payer: Self-pay | Admitting: Cardiology

## 2014-02-28 NOTE — Telephone Encounter (Signed)
Attempted to call pt and confirm remote transmission for today. No answer

## 2014-03-01 ENCOUNTER — Encounter: Payer: Self-pay | Admitting: Cardiology

## 2014-03-31 ENCOUNTER — Ambulatory Visit (INDEPENDENT_AMBULATORY_CARE_PROVIDER_SITE_OTHER): Payer: Medicare Other | Admitting: *Deleted

## 2014-03-31 ENCOUNTER — Telehealth: Payer: Self-pay | Admitting: *Deleted

## 2014-03-31 DIAGNOSIS — I472 Ventricular tachycardia: Secondary | ICD-10-CM | POA: Diagnosis not present

## 2014-03-31 DIAGNOSIS — I509 Heart failure, unspecified: Secondary | ICD-10-CM | POA: Diagnosis not present

## 2014-03-31 DIAGNOSIS — I4729 Other ventricular tachycardia: Secondary | ICD-10-CM | POA: Diagnosis not present

## 2014-03-31 LAB — MDC_IDC_ENUM_SESS_TYPE_REMOTE
Battery Remaining Longevity: 112 mo
Brady Statistic AP VP Percent: 0.01 %
Brady Statistic AS VP Percent: 0.02 %
Brady Statistic AS VS Percent: 30.17 %
Brady Statistic RA Percent Paced: 69.81 %
Date Time Interrogation Session: 20150806073526
HighPow Impedance: 57 Ohm
HighPow Impedance: 66 Ohm
Lead Channel Impedance Value: 437 Ohm
Lead Channel Pacing Threshold Pulse Width: 0.4 ms
Lead Channel Pacing Threshold Pulse Width: 0.4 ms
Lead Channel Sensing Intrinsic Amplitude: 2.25 mV
Lead Channel Sensing Intrinsic Amplitude: 2.25 mV
Lead Channel Sensing Intrinsic Amplitude: 5.875 mV
Lead Channel Setting Pacing Amplitude: 2.5 V
Lead Channel Setting Pacing Pulse Width: 0.4 ms
MDC IDC MSMT BATTERY VOLTAGE: 3.02 V
MDC IDC MSMT LEADCHNL RA PACING THRESHOLD AMPLITUDE: 0.875 V
MDC IDC MSMT LEADCHNL RV IMPEDANCE VALUE: 323 Ohm
MDC IDC MSMT LEADCHNL RV PACING THRESHOLD AMPLITUDE: 1.125 V
MDC IDC MSMT LEADCHNL RV SENSING INTR AMPL: 5.875 mV
MDC IDC SET LEADCHNL RA PACING AMPLITUDE: 2 V
MDC IDC SET LEADCHNL RV SENSING SENSITIVITY: 0.3 mV
MDC IDC SET ZONE DETECTION INTERVAL: 330 ms
MDC IDC STAT BRADY AP VS PERCENT: 69.79 %
MDC IDC STAT BRADY RV PERCENT PACED: 0.04 %
Zone Setting Detection Interval: 300 ms
Zone Setting Detection Interval: 350 ms
Zone Setting Detection Interval: 360 ms

## 2014-03-31 NOTE — Telephone Encounter (Signed)
ICM transmission received. I left a message for the patient to call. 

## 2014-04-04 ENCOUNTER — Encounter: Payer: Self-pay | Admitting: *Deleted

## 2014-04-04 NOTE — Telephone Encounter (Signed)
I spoke with the patient. 

## 2014-04-04 NOTE — Progress Notes (Signed)
EPIC Encounter for ICM Monitoring  Patient Name: [name] is a 58 y.o. male Date: 04/04/2014 Primary Care Physican: Julian HySOUTH,STEPHEN ALAN, MD Primary Cardiologist: [place] Electrophysiologist: Allred Dry Weight: 181 lbs       In the past month, have you:  1. Gained more than 2 pounds in a day or more than 5 pounds in a week? no  2. Had changes in your medications (with verification of current medications)? no  3. Had more shortness of breath than is usual for you? no  4. Limited your activity because of shortness of breath? no  5. Not been able to sleep because of shortness of breath? no  6. Had increased swelling in your feet or ankles? no  7. Had symptoms of dehydration (dizziness, dry mouth, increased thirst, decreased urine output) no  8. Had changes in sodium restriction? no  9. Been compliant with medication? Yes   ICM trend:   Follow-up plan: ICM clinic phone appointment: 05/05/14  Copy of note sent to patient's primary care physician, primary cardiologist, and device following physician.  Sherri RadHeather McGhee, RN, BSN 04/04/2014 5:30 PM

## 2014-04-04 NOTE — Addendum Note (Signed)
Addended by: Sherri RadMCGHEE, Favor Hackler C on: 04/04/2014 06:09 PM   Modules accepted: Level of Service

## 2014-04-04 NOTE — Progress Notes (Signed)
Remote ICD transmission.   

## 2014-04-07 ENCOUNTER — Telehealth: Payer: Self-pay | Admitting: *Deleted

## 2014-04-07 NOTE — Telephone Encounter (Signed)
Left message for patient to return our call.

## 2014-04-20 NOTE — Telephone Encounter (Signed)
Left message x 2 for patient to return call per Dr. Johney Frame to inquire about the EMI on his remote report dated 03/31/14.

## 2014-04-25 ENCOUNTER — Encounter: Payer: Self-pay | Admitting: Internal Medicine

## 2014-05-05 ENCOUNTER — Ambulatory Visit (INDEPENDENT_AMBULATORY_CARE_PROVIDER_SITE_OTHER): Payer: Medicare Other | Admitting: *Deleted

## 2014-05-05 DIAGNOSIS — I509 Heart failure, unspecified: Secondary | ICD-10-CM

## 2014-05-05 DIAGNOSIS — Z95810 Presence of automatic (implantable) cardiac defibrillator: Secondary | ICD-10-CM | POA: Diagnosis not present

## 2014-05-09 ENCOUNTER — Encounter: Payer: Self-pay | Admitting: *Deleted

## 2014-05-09 NOTE — Progress Notes (Signed)
EPIC Encounter for ICM Monitoring  Patient Name: [name] is a 58 y.o. male Date: 05/09/2014 Primary Care Physican: Julian Hy, MD Primary Cardiologist: [place] Electrophysiologist: Allred Dry Weight: 181 lbs       In the past month, have you:  1. Gained more than 2 pounds in a day or more than 5 pounds in a week? no  2. Had changes in your medications (with verification of current medications)? no  3. Had more shortness of breath than is usual for you? no  4. Limited your activity because of shortness of breath? no  5. Not been able to sleep because of shortness of breath? no  6. Had increased swelling in your feet or ankles? no  7. Had symptoms of dehydration (dizziness, dry mouth, increased thirst, decreased urine output) no  8. Had changes in sodium restriction? no  9. Been compliant with medication? Yes   ICM trend:   Follow-up plan: ICM clinic phone appointment: 06/09/14  Copy of note sent to patient's primary care physician, primary cardiologist, and device following physician.  Sherri Rad, RN, BSN 05/09/2014 10:30 AM

## 2014-06-09 ENCOUNTER — Encounter: Payer: Medicare Other | Admitting: *Deleted

## 2014-06-10 ENCOUNTER — Telehealth: Payer: Self-pay | Admitting: *Deleted

## 2014-06-10 NOTE — Telephone Encounter (Signed)
ICM transmission received. I left a message for the patient to call. 

## 2014-06-13 NOTE — Telephone Encounter (Signed)
I left a message for the patient to call. 

## 2014-06-13 NOTE — Telephone Encounter (Signed)
Attempted to call the patient. Rang until the phone cut off.

## 2014-06-13 NOTE — Telephone Encounter (Signed)
I attempted to call the patient back. I left a message I will call back later.

## 2014-06-13 NOTE — Telephone Encounter (Signed)
Follow up ° ° ° ° ° °Returning Heather's call °

## 2014-06-14 NOTE — Telephone Encounter (Signed)
I left a message for the patient to call. 

## 2014-06-15 ENCOUNTER — Ambulatory Visit (INDEPENDENT_AMBULATORY_CARE_PROVIDER_SITE_OTHER): Payer: Medicare Other | Admitting: Cardiology

## 2014-06-15 ENCOUNTER — Encounter: Payer: Self-pay | Admitting: Cardiology

## 2014-06-15 VITALS — BP 120/84 | HR 75 | Ht 67.0 in | Wt 181.5 lb

## 2014-06-15 DIAGNOSIS — I2510 Atherosclerotic heart disease of native coronary artery without angina pectoris: Secondary | ICD-10-CM | POA: Diagnosis not present

## 2014-06-15 DIAGNOSIS — I472 Ventricular tachycardia: Secondary | ICD-10-CM

## 2014-06-15 DIAGNOSIS — I4729 Other ventricular tachycardia: Secondary | ICD-10-CM

## 2014-06-15 DIAGNOSIS — Z951 Presence of aortocoronary bypass graft: Secondary | ICD-10-CM

## 2014-06-15 DIAGNOSIS — I5022 Chronic systolic (congestive) heart failure: Secondary | ICD-10-CM | POA: Diagnosis not present

## 2014-06-15 DIAGNOSIS — Z720 Tobacco use: Secondary | ICD-10-CM

## 2014-06-15 DIAGNOSIS — F17200 Nicotine dependence, unspecified, uncomplicated: Secondary | ICD-10-CM

## 2014-06-15 DIAGNOSIS — I509 Heart failure, unspecified: Secondary | ICD-10-CM | POA: Diagnosis not present

## 2014-06-15 NOTE — Progress Notes (Signed)
Suezanne Jacquetobert E Karnik Date of Birth: 1956-05-24   History of Present Illness: Mr. [name] is seen back today for a followup visit. He is status post CABG in September 2012 following an anterior myocardial infarction.. He has a history of cardiac arrest and is status post ICD implant. He continues to smoke. He states he enjoys it too much. He denies any chest pain or shortness of breath. He continues to exercise regularly.  No edema or orthopnea. Feels well overall.   Current Outpatient Prescriptions on File Prior to Visit  Medication Sig Dispense Refill  . aspirin EC 325 MG tablet Take 325 mg by mouth daily.        Marland Kitchen. atorvastatin (LIPITOR) 80 MG tablet TAKE 1 TABLET BY MOUTH EVERY NIGHT AT BEDTIME  30 tablet  3  . carvedilol (COREG) 25 MG tablet TAKE 1 TABLET BY MOUTH TWICE DAILY WITH A MEAL  60 tablet  5  . nitroGLYCERIN (NITROSTAT) 0.4 MG SL tablet Place 1 tablet (0.4 mg total) under the tongue every 5 (five) minutes x 3 doses as needed for chest pain. IF NEEDED  25 tablet  6  . ramipril (ALTACE) 10 MG capsule Take 1 capsule (10 mg total) by mouth 2 (two) times daily.  180 capsule  3  . spironolactone (ALDACTONE) 25 MG tablet Take 1 tablet (25 mg total) by mouth daily.  30 tablet  6   No current facility-administered medications on file prior to visit.    Allergies  Allergen Reactions  . Morphine And Related     Past Medical History  Diagnosis Date  . CAD (coronary artery disease)     Est. EF of 45% -- Successful intracoronary stenting of the mid-LAD -- Three-vessel atherosclerotic coronary artery disease.  The patient has occlusion of the mid LAD, which is his culprit lesion.  He has  moderate disease in the proximal circumflex and PDA -- Moderate left ventricular dysfunction -- [name]  . Ischemic cardiomyopathy     severe ischemic cardiomyopathy -- ejection fraction 30-35%  . Polymorphic ventricular tachycardia     with VF arrest, s/p subsequent ICD  . Ejection  fraction < 50%     30-35%  . HL (hearing loss)   . CHF NYHA class II      New York Heart Association class II/III heart failure  . Adrenal mass     currently being evaluated -- 4.1 cm right adrenal mass  . Acute anterior myocardial infarction 09/02/2008    post emergent stenting of the LAD in January 2010 -- anterior ST-elevation myocardial infarction  . Hyperlipidemia   . Hypertension   . Cardiac arrest - ventricular fibrillation 09/02/2008    Out of hospital cardiac arrest secondary to arrhythmia  . History of methicillin resistant staphylococcus aureus (MRSA)     History of methicillin-sensitive Staphylococcus aureus bacteremia  with implantable cardioverter-defibrillator explant, February 23, 2009  . CHF (congestive heart failure)   . Pericarditis     history of acute pericarditis in July 2010 now resolved  . Anemia   . S/P CABG (coronary artery bypass graft) Sept 2012    Past Surgical History  Procedure Laterality Date  . Ankle surgery    . Icd implant  02/23/2009     with subsequent extraction for MSSA bacteremia  . Icd reimplantation  05/09/2009    status post implant of a Medtronic Maximo II DR dual- chamber cardioverter defibrillator by Dr. Hillis RangeJames Allred  . Cardiac catheterization  08/26/2008    Est. EF of 45% -- Successful intracoronary stenting of the mid-LAD -- Three-vessel atherosclerotic coronary artery disease.  The patient has occlusion of the mid LAD, which is his culprit lesion.  He has  moderate disease in the proximal circumflex and PDA -- Moderate left ventricular dysfunction -- [name]  . Cardiac catheterization  03/16/2009     EF was approximately 30% -- Atherosclerotic coronary vascular disease, three-vessel --  Moderate-to-severe ischemic cardiomyopathy -- No clear evidence for an acute culprit lesion that would explain the patient's chest pain -- [name].   . Coronary artery bypass graft  05/07/2011    CABG x3:  LIMA to LAD, SVG to OM2,  SVG to PDA  . Implantable cardioverter defibrillator revision  03/05/13    RV lead fracture.  A new [employer] [id number] lead was placed    History  Smoking status  . Current Every Day Smoker -- 37 years  . Types: Cigarettes  . Last Attempt to Quit: 12/18/2011  Smokeless tobacco  . Not on file    Comment: 1 pack per week. Has been smoking for 35 years.     History  Alcohol Use No    Family History  Problem Relation Age of Onset  . Stroke Mother   . Coronary artery disease Father     Review of Systems: The review of systems is per the HPI.  Weight is down 3 lbs. All other systems were reviewed and are negative.  Physical Exam: BP 120/84  Pulse 75  Ht 5\' 7"  (1.702 m)  Wt 181 lb 8 oz (82.328 kg)  BMI 28.42 kg/m2 Patient is very pleasant and in no acute distress. Skin is warm and dry. Color is normal.  HEENT is unremarkable. Normocephalic/atraumatic. PERRL. Sclera are nonicteric. Neck is supple. No masses. No JVD. Lungs are clear. Cardiac exam shows a regular rate and rhythm.  His ICD site has healed well. Abdomen is soft. Extremities are without edema. Gait and ROM are intact. No gross neurologic deficits noted.   LABORATORY DATA:  Lab Results  Component Value Date   WBC 7.0 07/09/2011   HGB 13.8 07/09/2011   HCT 41.2 07/09/2011   PLT 244.0 07/09/2011   GLUCOSE 95 12/03/2012   CHOL 130 12/03/2012   TRIG 55.0 12/03/2012   HDL 27.00* 12/03/2012   LDLDIRECT 217.3 04/04/2011   LDLCALC 92 12/03/2012   ALT 23 12/03/2012   AST 19 12/03/2012   NA 139 12/03/2012   K 4.6 12/03/2012   CL 106 12/03/2012   CREATININE 0.9 12/03/2012   BUN 14 12/03/2012   CO2 26 12/03/2012   TSH 2.567 Test methodology is 3rd generation TSH 03/16/2009   INR 1.48 05/07/2011   HGBA1C  Value: 5.2 (NOTE) The ADA recommends the following therapeutic goal for glycemic control related to Hgb A1c measurement: Goal of therapy: <6.5 Hgb A1c  Reference: American Diabetes Association: Clinical Practice Recommendations  2010, Diabetes Care, 2010, 33: (Suppl  1). 03/16/2009    Ecg: Atrial paced with rate 75 bpm. Old anterior infarct.   ICD check 03/31/14 was satisfactory.  Assessment / Plan: 1. Coronary disease status post anterior myocardial infarction. Status post CABG in September 2012. Patient is asymptomatic. We'll continue with aspirin, beta blocker, and statin therapy.  2. Ischemic cardiomyopathy ejection fraction 35%. He is on optimal medical therapy with carvedilol, Altace, and Aldactone. No evidence of volume overload. He has class I symptoms.  3. Tobacco abuse. Encourage  smoking cessation.  4. History of cardiac arrest with polymorphic VT. History of inappropriate ICD shock related to lead fracture. Status post revision in July 2014. Most recent ICD check was satisfactory.  5. Hyperlipidemia, on high dose statin. Will check fasting lab work today  6. Hypertension, blood pressure is well controlled.

## 2014-06-15 NOTE — Patient Instructions (Signed)
We will check lab work today   Continue your therapy  Stop smoking  I will see you in 6 months.

## 2014-06-16 LAB — HEPATIC FUNCTION PANEL
ALT: 15 U/L (ref 0–53)
AST: 16 U/L (ref 0–37)
Albumin: 4.2 g/dL (ref 3.5–5.2)
Alkaline Phosphatase: 100 U/L (ref 39–117)
BILIRUBIN DIRECT: 0.1 mg/dL (ref 0.0–0.3)
Indirect Bilirubin: 0.5 mg/dL (ref 0.2–1.2)
TOTAL PROTEIN: 7 g/dL (ref 6.0–8.3)
Total Bilirubin: 0.6 mg/dL (ref 0.2–1.2)

## 2014-06-16 LAB — BASIC METABOLIC PANEL
BUN: 13 mg/dL (ref 6–23)
CHLORIDE: 105 meq/L (ref 96–112)
CO2: 25 meq/L (ref 19–32)
Calcium: 9.6 mg/dL (ref 8.4–10.5)
Creat: 0.94 mg/dL (ref 0.50–1.35)
GLUCOSE: 77 mg/dL (ref 70–99)
POTASSIUM: 4.8 meq/L (ref 3.5–5.3)
Sodium: 136 mEq/L (ref 135–145)

## 2014-06-16 LAB — LIPID PANEL
CHOL/HDL RATIO: 4.1 ratio
CHOLESTEROL: 145 mg/dL (ref 0–200)
HDL: 35 mg/dL — ABNORMAL LOW (ref >39)
LDL Cholesterol: 97 mg/dL (ref 0–99)
TRIGLYCERIDES: 67 mg/dL (ref <150)
VLDL: 13 mg/dL (ref 0–40)

## 2014-06-21 ENCOUNTER — Encounter: Payer: Self-pay | Admitting: *Deleted

## 2014-06-21 NOTE — Telephone Encounter (Signed)
Letter of results mailed to patient. I will reschedule the patient's next ICM transmission for 07/14/14.

## 2014-07-02 ENCOUNTER — Other Ambulatory Visit: Payer: Self-pay | Admitting: Cardiology

## 2014-07-14 ENCOUNTER — Ambulatory Visit (INDEPENDENT_AMBULATORY_CARE_PROVIDER_SITE_OTHER): Payer: Medicare Other | Admitting: *Deleted

## 2014-07-14 DIAGNOSIS — I472 Ventricular tachycardia: Secondary | ICD-10-CM | POA: Diagnosis not present

## 2014-07-14 DIAGNOSIS — I5022 Chronic systolic (congestive) heart failure: Secondary | ICD-10-CM

## 2014-07-14 DIAGNOSIS — I4729 Other ventricular tachycardia: Secondary | ICD-10-CM

## 2014-07-14 NOTE — Progress Notes (Signed)
Remote ICD transmission.   

## 2014-07-15 LAB — MDC_IDC_ENUM_SESS_TYPE_REMOTE
Brady Statistic AP VP Percent: 0 %
Brady Statistic AP VS Percent: 85.41 %
Brady Statistic AS VS Percent: 14.59 %
Brady Statistic RA Percent Paced: 85.41 %
Brady Statistic RV Percent Paced: 0 %
HIGH POWER IMPEDANCE MEASURED VALUE: 68 Ohm
Lead Channel Impedance Value: 323 Ohm
Lead Channel Impedance Value: 323 Ohm
Lead Channel Pacing Threshold Amplitude: 1.25 V
Lead Channel Pacing Threshold Pulse Width: 0.4 ms
Lead Channel Sensing Intrinsic Amplitude: 6.25 mV
Lead Channel Setting Pacing Amplitude: 2 V
Lead Channel Setting Pacing Amplitude: 2.5 V
Lead Channel Setting Pacing Pulse Width: 0.4 ms
Lead Channel Setting Sensing Sensitivity: 0.3 mV
MDC IDC MSMT BATTERY REMAINING LONGEVITY: 108 mo
MDC IDC MSMT BATTERY VOLTAGE: 2.98 V
MDC IDC MSMT LEADCHNL RA IMPEDANCE VALUE: 494 Ohm
MDC IDC MSMT LEADCHNL RA PACING THRESHOLD AMPLITUDE: 1 V
MDC IDC MSMT LEADCHNL RA SENSING INTR AMPL: 2.125 mV
MDC IDC MSMT LEADCHNL RA SENSING INTR AMPL: 2.125 mV
MDC IDC MSMT LEADCHNL RV PACING THRESHOLD PULSEWIDTH: 0.4 ms
MDC IDC MSMT LEADCHNL RV SENSING INTR AMPL: 6.25 mV
MDC IDC SESS DTM: 20151119143608
MDC IDC SET ZONE DETECTION INTERVAL: 300 ms
MDC IDC SET ZONE DETECTION INTERVAL: 350 ms
MDC IDC STAT BRADY AS VP PERCENT: 0 %
Zone Setting Detection Interval: 330 ms
Zone Setting Detection Interval: 360 ms

## 2014-07-27 ENCOUNTER — Encounter: Payer: Self-pay | Admitting: Cardiology

## 2014-08-01 ENCOUNTER — Encounter: Payer: Self-pay | Admitting: Internal Medicine

## 2014-08-04 ENCOUNTER — Encounter (HOSPITAL_COMMUNITY): Payer: Self-pay | Admitting: Internal Medicine

## 2014-08-15 ENCOUNTER — Ambulatory Visit (INDEPENDENT_AMBULATORY_CARE_PROVIDER_SITE_OTHER): Payer: Medicare Other | Admitting: *Deleted

## 2014-08-15 ENCOUNTER — Encounter: Payer: Self-pay | Admitting: *Deleted

## 2014-08-15 DIAGNOSIS — Z95810 Presence of automatic (implantable) cardiac defibrillator: Secondary | ICD-10-CM

## 2014-08-15 DIAGNOSIS — I5022 Chronic systolic (congestive) heart failure: Secondary | ICD-10-CM

## 2014-08-15 NOTE — Progress Notes (Signed)
EPIC Encounter for ICM Monitoring  Patient Name: [name] is a 58 y.o. male Date: 08/15/2014 Primary Care Physican: Julian HySOUTH,STEPHEN ALAN, MD Primary Cardiologist: [place] Electrophysiologist: Allred Dry Weight: 177 lbs       In the past month, have you:  1. Gained more than 2 pounds in a day or more than 5 pounds in a week? No. His baseline weight is actually down from 181 lbs to 177 lbs.  2. Had changes in your medications (with verification of current medications)? no  3. Had more shortness of breath than is usual for you? no  4. Limited your activity because of shortness of breath? no  5. Not been able to sleep because of shortness of breath? no  6. Had increased swelling in your feet or ankles? no  7. Had symptoms of dehydration (dizziness, dry mouth, increased thirst, decreased urine output) no  8. Had changes in sodium restriction? no  9. Been compliant with medication? Yes   ICM trend:   Follow-up plan: ICM clinic phone appointment: 09/15/14  Copy of note sent to patient's primary care physician, primary cardiologist, and device following physician.  Sherri RadHeather Mayeli Bornhorst, RN, BSN 08/15/2014 3:02 PM

## 2014-08-17 ENCOUNTER — Other Ambulatory Visit: Payer: Self-pay | Admitting: Cardiology

## 2014-08-17 MED ORDER — CARVEDILOL 25 MG PO TABS: ORAL_TABLET | ORAL | Status: DC

## 2014-09-15 ENCOUNTER — Encounter: Payer: Self-pay | Admitting: *Deleted

## 2014-09-15 ENCOUNTER — Ambulatory Visit (INDEPENDENT_AMBULATORY_CARE_PROVIDER_SITE_OTHER): Payer: Medicare Other | Admitting: *Deleted

## 2014-09-15 DIAGNOSIS — I5022 Chronic systolic (congestive) heart failure: Secondary | ICD-10-CM

## 2014-09-15 DIAGNOSIS — Z95810 Presence of automatic (implantable) cardiac defibrillator: Secondary | ICD-10-CM | POA: Diagnosis not present

## 2014-09-15 NOTE — Progress Notes (Signed)
EPIC Encounter for ICM Monitoring  Patient Name: Billy JacquetRobert E [name] is a 59 y.o. male Date: 09/15/2014 Primary Care Physican: Julian HySOUTH,STEPHEN ALAN, MD Primary Cardiologist: [place] Electrophysiologist: Allred Dry Weight: 174 lbs       In the past month, have you:  1. Gained more than 2 pounds in a day or more than 5 pounds in a week? no  2. Had changes in your medications (with verification of current medications)? no  3. Had more shortness of breath than is usual for you? no  4. Limited your activity because of shortness of breath? no  5. Not been able to sleep because of shortness of breath? no  6. Had increased swelling in your feet or ankles? no  7. Had symptoms of dehydration (dizziness, dry mouth, increased thirst, decreased urine output) no  8. Had changes in sodium restriction? no  9. Been compliant with medication? Yes   ICM trend:   Follow-up plan: ICM clinic phone appointment: 12/01/14. The patient's optivol trends have been consistently stable. He was due for follow up in December with Dr. Johney FrameAllred, but no appointment was made. I have scheduled him to see Dr. Johney FrameAllred on 10/31/14. I advised him that since he has been doing so well, that I will not check him in February as it will be so close to his follow up appointment. I will follow back up with him after his office visit. I have advised him to call with any changes or concerns. He voices understanding.   Copy of note sent to patient's primary care physician, primary cardiologist, and device following physician.  Sherri RadHeather McGhee, RN, BSN 09/15/2014 10:54 AM

## 2014-09-28 ENCOUNTER — Other Ambulatory Visit: Payer: Self-pay | Admitting: Cardiology

## 2014-09-28 NOTE — Telephone Encounter (Signed)
Rx has been sent to the pharmacy electronically. ° °

## 2014-10-31 ENCOUNTER — Encounter: Payer: Medicare Other | Admitting: Internal Medicine

## 2014-12-01 ENCOUNTER — Ambulatory Visit (INDEPENDENT_AMBULATORY_CARE_PROVIDER_SITE_OTHER): Payer: Medicare Other | Admitting: *Deleted

## 2014-12-01 DIAGNOSIS — Z95810 Presence of automatic (implantable) cardiac defibrillator: Secondary | ICD-10-CM

## 2014-12-01 DIAGNOSIS — I5022 Chronic systolic (congestive) heart failure: Secondary | ICD-10-CM

## 2014-12-02 ENCOUNTER — Encounter: Payer: Self-pay | Admitting: *Deleted

## 2014-12-02 NOTE — Progress Notes (Signed)
EPIC Encounter for ICM Monitoring  Patient Name: Billy JacquetRobert E Wortmann is a 59 y.o. male Date: 12/02/2014 Primary Care Physican: Julian HySOUTH,STEPHEN ALAN, MD Primary Cardiologist: [place] Electrophysiologist: Allred Dry Weight: 174 lbs       In the past month, have you:  1. Gained more than 2 pounds in a day or more than 5 pounds in a week? no  2. Had changes in your medications (with verification of current medications)? no  3. Had more shortness of breath than is usual for you? no  4. Limited your activity because of shortness of breath? no  5. Not been able to sleep because of shortness of breath? no  6. Had increased swelling in your feet or ankles? no  7. Had symptoms of dehydration (dizziness, dry mouth, increased thirst, decreased urine output) no  8. Had changes in sodium restriction? no  9. Been compliant with medication? Yes   ICM trend:   Follow-up plan: ICM clinic phone appointment: 01/24/15. The patient is doing well at the present time. He does report that he broke his toe recently. He is scheduled for routine follow up with Dr. Johney FrameAllred on 12/19/14.  Copy of note sent to patient's primary care physician, primary cardiologist, and device following physician.  Sherri RadHeather Averyana Pillars, RN, BSN 12/02/2014 9:35 AM

## 2014-12-19 ENCOUNTER — Encounter: Payer: Self-pay | Admitting: Internal Medicine

## 2014-12-19 ENCOUNTER — Ambulatory Visit (INDEPENDENT_AMBULATORY_CARE_PROVIDER_SITE_OTHER): Payer: Medicare Other | Admitting: Internal Medicine

## 2014-12-19 VITALS — BP 110/76 | HR 83 | Ht 67.0 in | Wt 177.2 lb

## 2014-12-19 DIAGNOSIS — F17200 Nicotine dependence, unspecified, uncomplicated: Secondary | ICD-10-CM

## 2014-12-19 DIAGNOSIS — I5022 Chronic systolic (congestive) heart failure: Secondary | ICD-10-CM | POA: Diagnosis not present

## 2014-12-19 DIAGNOSIS — Z720 Tobacco use: Secondary | ICD-10-CM | POA: Diagnosis not present

## 2014-12-19 DIAGNOSIS — I469 Cardiac arrest, cause unspecified: Secondary | ICD-10-CM

## 2014-12-19 LAB — MDC_IDC_ENUM_SESS_TYPE_INCLINIC
Battery Remaining Longevity: 104 mo
Brady Statistic AP VP Percent: 0.02 %
Brady Statistic AS VS Percent: 24.65 %
Brady Statistic RA Percent Paced: 75.32 %
Brady Statistic RV Percent Paced: 0.04 %
Date Time Interrogation Session: 20160425145930
HighPow Impedance: 57 Ohm
HighPow Impedance: 69 Ohm
Lead Channel Pacing Threshold Pulse Width: 0.4 ms
Lead Channel Sensing Intrinsic Amplitude: 1.25 mV
Lead Channel Sensing Intrinsic Amplitude: 6.625 mV
Lead Channel Setting Pacing Amplitude: 2 V
Lead Channel Setting Pacing Pulse Width: 0.4 ms
Lead Channel Setting Sensing Sensitivity: 0.3 mV
MDC IDC MSMT BATTERY VOLTAGE: 3 V
MDC IDC MSMT LEADCHNL RA IMPEDANCE VALUE: 513 Ohm
MDC IDC MSMT LEADCHNL RA PACING THRESHOLD AMPLITUDE: 1 V
MDC IDC MSMT LEADCHNL RA PACING THRESHOLD PULSEWIDTH: 0.4 ms
MDC IDC MSMT LEADCHNL RV IMPEDANCE VALUE: 323 Ohm
MDC IDC MSMT LEADCHNL RV PACING THRESHOLD AMPLITUDE: 0.75 V
MDC IDC SET LEADCHNL RV PACING AMPLITUDE: 2.5 V
MDC IDC SET ZONE DETECTION INTERVAL: 350 ms
MDC IDC STAT BRADY AP VS PERCENT: 75.3 %
MDC IDC STAT BRADY AS VP PERCENT: 0.02 %
Zone Setting Detection Interval: 300 ms
Zone Setting Detection Interval: 330 ms
Zone Setting Detection Interval: 360 ms

## 2014-12-19 NOTE — Progress Notes (Signed)
Electrophysiology Office Note   Date:  12/19/2014   ID:  [name], DOB [birth year], MRN [id number]  PCP:  Julian Hy, MD  Cardiologist:  [name] Primary Electrophysiologist: Hillis Range, MD    Chief Complaint  Patient presents with  . PVT     History of Present Illness: [name] is a 59 y.o. male who presents today for electrophysiology evaluation.   Doing well.  Self employed working on Database administrator.  No CV symptoms presently.   Today, he denies symptoms of palpitations, chest pain, shortness of breath, orthopnea, PND, lower extremity edema, claudication, dizziness, presyncope, syncope, bleeding, or neurologic sequela. The patient is tolerating medications without difficulties and is otherwise without complaint today.    Past Medical History  Diagnosis Date  . CAD (coronary artery disease)     Est. EF of 45% -- Successful intracoronary stenting of the mid-LAD -- Three-vessel atherosclerotic coronary artery disease.  The patient has occlusion of the mid LAD, which is his culprit lesion.  He has  moderate disease in the proximal circumflex and PDA -- Moderate left ventricular dysfunction -- [name], [name]  . Ischemic cardiomyopathy     severe ischemic cardiomyopathy -- ejection fraction 30-35%  . Polymorphic ventricular tachycardia     with VF arrest, s/p subsequent ICD  . Ejection fraction < 50%     30-35%  . HL (hearing loss)   . CHF NYHA class II      New York Heart Association class II/III heart failure  . Adrenal mass     currently being evaluated -- 4.1 cm right adrenal mass  . Acute anterior myocardial infarction 09/02/2008    post emergent stenting of the LAD in January 2010 -- anterior ST-elevation myocardial infarction  . Hyperlipidemia   . Hypertension   . Cardiac arrest - ventricular fibrillation 09/02/2008    Out of hospital cardiac arrest secondary to arrhythmia  . History of methicillin resistant staphylococcus aureus  (MRSA)     History of methicillin-sensitive Staphylococcus aureus bacteremia  with implantable cardioverter-defibrillator explant, February 23, 2009  . CHF (congestive heart failure)   . Pericarditis     history of acute pericarditis in July 2010 now resolved  . Anemia   . S/P CABG (coronary artery bypass graft) Sept 2012   Past Surgical History  Procedure Laterality Date  . Ankle surgery    . Icd implant  02/23/2009     with subsequent extraction for MSSA bacteremia  . Icd reimplantation  05/09/2009    status post implant of a Medtronic Maximo II DR dual- chamber cardioverter defibrillator by Dr. Hillis Range  . Cardiac catheterization  08/26/2008    Est. EF of 45% -- Successful intracoronary stenting of the mid-LAD -- Three-vessel atherosclerotic coronary artery disease.  The patient has occlusion of the mid LAD, which is his culprit lesion.  He has  moderate disease in the proximal circumflex and PDA -- Moderate left ventricular dysfunction -- [name], [name]  . Cardiac catheterization  03/16/2009     EF was approximately 30% -- Atherosclerotic coronary vascular disease, three-vessel --  Moderate-to-severe ischemic cardiomyopathy -- No clear evidence for an acute culprit lesion that would explain the patient's chest pain -- [name], [name].   . Coronary artery bypass graft  05/07/2011    CABG x3:  LIMA to LAD, SVG to OM2, SVG to PDA  . Implantable cardioverter defibrillator revision  03/05/13    RV lead fracture.  A new [employer] [id number] lead was placed  . Lead revision Left 03/05/2013    Procedure: LEAD REVISION;  Surgeon: Hillis RangeJames Jessia Kief, MD;  [facility];  Service: Cardiovascular;  Laterality: Left;     Current Outpatient Prescriptions  Medication Sig Dispense Refill  . aspirin EC 325 MG tablet Take 325 mg by mouth daily.      Marland Kitchen. atorvastatin (LIPITOR) 80 MG tablet TAKE 1 TABLET BY MOUTH EVERY NIGHT AT BEDTIME 30 tablet 5  . carvedilol (COREG) 25 MG tablet TAKE 1  TABLET BY MOUTH TWICE DAILY WITH A MEAL 60 tablet 5  . nitroGLYCERIN (NITROSTAT) 0.4 MG SL tablet Place 1 tablet (0.4 mg total) under the tongue every 5 (five) minutes x 3 doses as needed for chest pain. IF NEEDED 25 tablet 6  . ramipril (ALTACE) 10 MG capsule Take 1 capsule (10 mg total) by mouth 2 (two) times daily. 180 capsule 3  . spironolactone (ALDACTONE) 25 MG tablet TAKE 1 TABLET BY MOUTH DAILY 30 tablet 3   No current facility-administered medications for this visit.    Allergies:   Morphine and related   Social History:  The patient  reports that he has been smoking Cigarettes.  He has smoked for the past 37 years. He does not have any smokeless tobacco history on file. He reports that he does not drink alcohol or use illicit drugs.   Family History:  The patient's family history includes Coronary artery disease in his father; Stroke in his mother.    ROS:  Please see the history of present illness.   All other systems are reviewed and negative.    PHYSICAL EXAM: VS:  BP 110/76 mmHg  Pulse 83  Ht 5\' 7"  (1.702 m)  Wt 177 lb 3.2 oz (80.377 kg)  BMI 27.75 kg/m2 , BMI Body mass index is 27.75 kg/(m^2). GEN: Well nourished, well developed, in no acute distress HEENT: normal Neck: no JVD, carotid bruits, or masses Cardiac: RRR; no murmurs, rubs, or gallops,no edema  Respiratory:  clear to auscultation bilaterally, normal work of breathing GI: soft, nontender, nondistended, + BS MS: no deformity or atrophy Skin: warm and dry, device pocket is well healed Neuro:  Strength and sensation are intact Psych: euthymic mood, full affect  Device interrogation is reviewed today in detail.  See PaceArt for details.   Recent Labs: 06/15/2014: ALT 15; BUN 13; Creatinine 0.94; Potassium 4.8; Sodium 136    Lipid Panel     Component Value Date/Time   CHOL 145 06/15/2014 1514   TRIG 67 06/15/2014 1514   HDL 35* 06/15/2014 1514   CHOLHDL 4.1 06/15/2014 1514   VLDL 13 06/15/2014 1514    LDLCALC 97 06/15/2014 1514   LDLDIRECT 217.3 04/04/2011 1124     Wt Readings from Last 3 Encounters:  12/19/14 177 lb 3.2 oz (80.377 kg)  06/15/14 181 lb 8 oz (82.328 kg)  12/08/13 184 lb 6.4 oz (83.643 kg)      ASSESSMENT AND PLAN:   1.  Chronic systolic dysfunction/ Ischemic CM/ prior VF arrest euvolemic today Stable on an appropriate medical regimen Normal ICD function See Pace Art report No changes today  2. Tobacco Cessation advised He is not ready to quit  [facility] Also followed in ICM device clinic Follow-up in Dr [place] as scheduled Return to see EP NP in 1 year  Current medicines are reviewed at length with the patient today.   The patient does not have concerns regarding his medicines.  The following changes were made today:  none  Signed, Hillis Range, MD  12/19/2014 2:51 PM     [facility] [address] [place] [address] [phone] (office) [phone] (fax)

## 2014-12-19 NOTE — Patient Instructions (Signed)
Medication Instructions:  Your physician recommends that you continue on your current medications as directed. Please refer to the Current Medication list given to you today.   Labwork: None ordered  Testing/Procedures: None ordered  Follow-Up: Remote monitoring is used to monitor your Pacemaker or ICD from home. This monitoring reduces the number of office visits required to check your device to one time per year. It allows us to keep an eye on the functioning of your device to ensure it is working properly. You are scheduled for a device check from home on 03/20/15. You may send your transmission at any time that day. If you have a wireless device, the transmission will be sent automatically. After your physician reviews your transmission, you will receive a postcard with your next transmission date.  Sherri RadHeather McGhee, RN will call you to follw monthly ICM's  Your physician wants you to follow-up in: 12 months with Gypsy BalsamAmber Seiler, NP  You will receive a reminder letter in the mail two months in advance. If you don't receive a letter, please call our office to schedule the follow-up appointment.     Any Other Special Instructions Will Be Listed Below (If Applicable).

## 2015-01-24 ENCOUNTER — Ambulatory Visit (INDEPENDENT_AMBULATORY_CARE_PROVIDER_SITE_OTHER): Payer: Medicare Other | Admitting: *Deleted

## 2015-01-24 ENCOUNTER — Telehealth: Payer: Self-pay | Admitting: Cardiology

## 2015-01-24 DIAGNOSIS — Z95810 Presence of automatic (implantable) cardiac defibrillator: Secondary | ICD-10-CM | POA: Diagnosis not present

## 2015-01-24 DIAGNOSIS — I5022 Chronic systolic (congestive) heart failure: Secondary | ICD-10-CM

## 2015-01-24 NOTE — Telephone Encounter (Signed)
LMOVM reminding pt to send remote transmission.   

## 2015-01-25 ENCOUNTER — Encounter: Payer: Self-pay | Admitting: *Deleted

## 2015-01-25 NOTE — Progress Notes (Signed)
EPIC Encounter for ICM Monitoring  Patient Name: Billy JacquetRobert E [name] is a 59 y.o. male Date: 01/25/2015 Primary Care Physican: Julian HySOUTH,STEPHEN ALAN, MD Primary Cardiologist: [place] Electrophysiologist: Allred Dry Weight: 174 lbs       In the past month, have you:  1. Gained more than 2 pounds in a day or more than 5 pounds in a week? no  2. Had changes in your medications (with verification of current medications)? no  3. Had more shortness of breath than is usual for you? no  4. Limited your activity because of shortness of breath? no  5. Not been able to sleep because of shortness of breath? no  6. Had increased swelling in your feet or ankles? no  7. Had symptoms of dehydration (dizziness, dry mouth, increased thirst, decreased urine output) no  8. Had changes in sodium restriction? no  9. Been compliant with medication? Yes   ICM trend:   Follow-up plan: ICM clinic phone appointment: 03/02/15. No changes made today.  Copy of note sent to patient's primary care physician, primary cardiologist, and device following physician.  Sherri RadHeather Lyly Canizales, RN, BSN 01/25/2015 5:57 PM

## 2015-02-10 ENCOUNTER — Other Ambulatory Visit: Payer: Self-pay | Admitting: Cardiology

## 2015-03-02 ENCOUNTER — Ambulatory Visit (INDEPENDENT_AMBULATORY_CARE_PROVIDER_SITE_OTHER): Payer: Medicare Other | Admitting: *Deleted

## 2015-03-02 ENCOUNTER — Telehealth: Payer: Self-pay | Admitting: Cardiology

## 2015-03-02 DIAGNOSIS — I5022 Chronic systolic (congestive) heart failure: Secondary | ICD-10-CM | POA: Diagnosis not present

## 2015-03-02 DIAGNOSIS — Z95810 Presence of automatic (implantable) cardiac defibrillator: Secondary | ICD-10-CM | POA: Diagnosis not present

## 2015-03-02 NOTE — Telephone Encounter (Signed)
LMOVM reminding pt to send remote transmission.   

## 2015-03-03 ENCOUNTER — Encounter: Payer: Self-pay | Admitting: *Deleted

## 2015-03-03 NOTE — Progress Notes (Signed)
EPIC Encounter for ICM Monitoring  Patient Name: [name] is a 59 y.o. male Date: 03/03/2015 Primary Care Physican: Julian HySOUTH,STEPHEN ALAN, MD Primary Cardiologist: [place] Electrophysiologist: Allred Dry Weight: 174 lb       In the past month, have you:  1. Gained more than 2 pounds in a day or more than 5 pounds in a week? no  2. Had changes in your medications (with verification of current medications)? no  3. Had more shortness of breath than is usual for you? no  4. Limited your activity because of shortness of breath? no  5. Not been able to sleep because of shortness of breath? no  6. Had increased swelling in your feet or ankles? no  7. Had symptoms of dehydration (dizziness, dry mouth, increased thirst, decreased urine output) no  8. Had changes in sodium restriction? no  9. Been compliant with medication? Yes   ICM trend:   Follow-up plan: ICM clinic phone appointment: 04/06/15. No changes made today. I did encourage the patient to stay well hydrated in the heat and humidity.   Copy of note sent to patient's primary care physician, primary cardiologist, and device following physician.  Sherri RadHeather Matheu Ploeger, RN, BSN 03/03/2015 3:03 PM

## 2015-04-06 ENCOUNTER — Ambulatory Visit (INDEPENDENT_AMBULATORY_CARE_PROVIDER_SITE_OTHER): Payer: Medicare Other | Admitting: *Deleted

## 2015-04-06 DIAGNOSIS — I5022 Chronic systolic (congestive) heart failure: Secondary | ICD-10-CM

## 2015-04-06 DIAGNOSIS — Z95810 Presence of automatic (implantable) cardiac defibrillator: Secondary | ICD-10-CM | POA: Diagnosis not present

## 2015-04-06 DIAGNOSIS — I472 Ventricular tachycardia: Secondary | ICD-10-CM

## 2015-04-06 DIAGNOSIS — I4729 Other ventricular tachycardia: Secondary | ICD-10-CM

## 2015-04-06 NOTE — Progress Notes (Signed)
Remote ICD transmission.   

## 2015-04-07 ENCOUNTER — Encounter: Payer: Self-pay | Admitting: *Deleted

## 2015-04-07 NOTE — Progress Notes (Signed)
EPIC Encounter for ICM Monitoring  Patient Name: [name] is a 59 y.o. male Date: 04/07/2015 Primary Care Physican: Julian Hy, MD Primary Cardiologist: [place] Electrophysiologist: Allred Dry Weight: 170 lbs       In the past month, have you:  1. Gained more than 2 pounds in a day or more than 5 pounds in a week? No. The patient has been running about 174 lbs, but is down to around 170 lbs currently.  2. Had changes in your medications (with verification of current medications)? no  3. Had more shortness of breath than is usual for you? no  4. Limited your activity because of shortness of breath? no  5. Not been able to sleep because of shortness of breath? no  6. Had increased swelling in your feet or ankles? no  7. Had symptoms of dehydration (dizziness, dry mouth, increased thirst, decreased urine output) no  8. Had changes in sodium restriction? no  9. Been compliant with medication? Yes   ICM trend:   Follow-up plan: ICM clinic phone appointment: 05/09/15. No changes made today.  Copy of note sent to patient's primary care physician, primary cardiologist, and device following physician.  Sherri Rad, RN, BSN 04/07/2015 3:39 PM

## 2015-04-12 LAB — CUP PACEART REMOTE DEVICE CHECK
Battery Remaining Longevity: 95 mo
Brady Statistic AP VS Percent: 86.28 %
Brady Statistic AS VP Percent: 0.01 %
Brady Statistic RA Percent Paced: 86.3 %
Brady Statistic RV Percent Paced: 0.03 %
HighPow Impedance: 58 Ohm
Lead Channel Impedance Value: 304 Ohm
Lead Channel Impedance Value: 304 Ohm
Lead Channel Pacing Threshold Amplitude: 1.125 V
Lead Channel Pacing Threshold Amplitude: 1.25 V
Lead Channel Pacing Threshold Pulse Width: 0.4 ms
Lead Channel Sensing Intrinsic Amplitude: 1.75 mV
Lead Channel Sensing Intrinsic Amplitude: 1.75 mV
Lead Channel Setting Pacing Amplitude: 2.5 V
Lead Channel Setting Pacing Pulse Width: 0.4 ms
MDC IDC MSMT BATTERY VOLTAGE: 3 V
MDC IDC MSMT LEADCHNL RA IMPEDANCE VALUE: 456 Ohm
MDC IDC MSMT LEADCHNL RV PACING THRESHOLD PULSEWIDTH: 0.4 ms
MDC IDC MSMT LEADCHNL RV SENSING INTR AMPL: 5.5 mV
MDC IDC MSMT LEADCHNL RV SENSING INTR AMPL: 5.5 mV
MDC IDC SESS DTM: 20160811122606
MDC IDC SET LEADCHNL RV PACING AMPLITUDE: 2.75 V
MDC IDC SET LEADCHNL RV SENSING SENSITIVITY: 0.3 mV
MDC IDC SET ZONE DETECTION INTERVAL: 300 ms
MDC IDC SET ZONE DETECTION INTERVAL: 350 ms
MDC IDC STAT BRADY AP VP PERCENT: 0.02 %
MDC IDC STAT BRADY AS VS PERCENT: 13.69 %
Zone Setting Detection Interval: 330 ms
Zone Setting Detection Interval: 360 ms

## 2015-04-18 ENCOUNTER — Encounter: Payer: Self-pay | Admitting: Cardiology

## 2015-04-20 ENCOUNTER — Encounter: Payer: Self-pay | Admitting: Internal Medicine

## 2015-05-11 ENCOUNTER — Telehealth: Payer: Self-pay

## 2015-05-11 NOTE — Telephone Encounter (Signed)
ICM transmission received.  Attempted call to patient and left message for return call. 

## 2015-05-22 NOTE — Telephone Encounter (Signed)
Unable to reach member for ICM follow up.  Will attempt to follow up next month.

## 2015-05-31 ENCOUNTER — Ambulatory Visit (INDEPENDENT_AMBULATORY_CARE_PROVIDER_SITE_OTHER): Payer: Medicare Other

## 2015-05-31 ENCOUNTER — Telehealth: Payer: Self-pay | Admitting: Cardiology

## 2015-05-31 DIAGNOSIS — Z95810 Presence of automatic (implantable) cardiac defibrillator: Secondary | ICD-10-CM

## 2015-05-31 DIAGNOSIS — I5022 Chronic systolic (congestive) heart failure: Secondary | ICD-10-CM | POA: Diagnosis not present

## 2015-05-31 NOTE — Progress Notes (Signed)
EPIC Encounter for ICM Monitoring  Patient Name: [name] is a 59 y.o. male Date: 05/31/2015 Primary Care Physican: Julian Hy, MD Primary Cardiologist: [place] Electrophysiologist: Allred Dry Weight: 170 lb       In the past month, have you:  1. Gained more than 2 pounds in a day or more than 5 pounds in a week? no  2. Had changes in your medications (with verification of current medications)? no  3. Had more shortness of breath than is usual for you? no  4. Limited your activity because of shortness of breath? no  5. Not been able to sleep because of shortness of breath? no  6. Had increased swelling in your feet or ankles? no  7. Had symptoms of dehydration (dizziness, dry mouth, increased thirst, decreased urine output) no  8. Had changes in sodium restriction? no  9. Been compliant with medication? Yes   ICM trend:  Follow-up plan: ICM clinic phone appointment 07/03/2015.  Optivol impedance trending along baseline.  Patient denied any problems at this time.  No changes today.    Copy of note sent to patient's primary care physician, primary cardiologist, and device following physician.  Karie Soda, RN, CCM 05/31/2015 1:54 PM

## 2015-05-31 NOTE — Telephone Encounter (Signed)
Spoke with pt and reminded pt of remote transmission that is due today. Pt verbalized understanding.   

## 2015-07-06 ENCOUNTER — Telehealth: Payer: Self-pay

## 2015-07-06 ENCOUNTER — Ambulatory Visit (INDEPENDENT_AMBULATORY_CARE_PROVIDER_SITE_OTHER): Payer: Medicare Other | Admitting: *Deleted

## 2015-07-06 DIAGNOSIS — I4729 Other ventricular tachycardia: Secondary | ICD-10-CM

## 2015-07-06 DIAGNOSIS — Z95810 Presence of automatic (implantable) cardiac defibrillator: Secondary | ICD-10-CM

## 2015-07-06 DIAGNOSIS — I5022 Chronic systolic (congestive) heart failure: Secondary | ICD-10-CM

## 2015-07-06 DIAGNOSIS — I472 Ventricular tachycardia: Secondary | ICD-10-CM | POA: Diagnosis not present

## 2015-07-06 NOTE — Telephone Encounter (Signed)
ICM transmission received.  Attempted patient call and left message for return call.  

## 2015-07-06 NOTE — Progress Notes (Signed)
Remote ICD transmission.   

## 2015-07-07 ENCOUNTER — Encounter: Payer: Self-pay | Admitting: Cardiology

## 2015-07-07 LAB — CUP PACEART REMOTE DEVICE CHECK
Battery Voltage: 3 V
Brady Statistic AP VS Percent: 74.1 %
Brady Statistic AS VP Percent: 0.02 %
Brady Statistic AS VS Percent: 25.86 %
HighPow Impedance: 66 Ohm
Implantable Lead Implant Date: 20100414
Implantable Lead Location: 753860
Lead Channel Impedance Value: 494 Ohm
Lead Channel Pacing Threshold Amplitude: 1.25 V
Lead Channel Pacing Threshold Pulse Width: 0.4 ms
Lead Channel Pacing Threshold Pulse Width: 0.4 ms
Lead Channel Sensing Intrinsic Amplitude: 2.125 mV
Lead Channel Sensing Intrinsic Amplitude: 6 mV
Lead Channel Setting Pacing Amplitude: 2.75 V
Lead Channel Setting Pacing Pulse Width: 0.4 ms
MDC IDC LEAD IMPLANT DT: 20140711
MDC IDC LEAD LOCATION: 753859
MDC IDC LEAD MODEL: 181
MDC IDC LEAD SERIAL: 323897
MDC IDC MSMT BATTERY REMAINING LONGEVITY: 94 mo
MDC IDC MSMT LEADCHNL RA PACING THRESHOLD AMPLITUDE: 0.875 V
MDC IDC MSMT LEADCHNL RA SENSING INTR AMPL: 2.125 mV
MDC IDC MSMT LEADCHNL RV IMPEDANCE VALUE: 304 Ohm
MDC IDC MSMT LEADCHNL RV IMPEDANCE VALUE: 323 Ohm
MDC IDC MSMT LEADCHNL RV SENSING INTR AMPL: 6 mV
MDC IDC SESS DTM: 20161110092308
MDC IDC SET LEADCHNL RA PACING AMPLITUDE: 2.25 V
MDC IDC SET LEADCHNL RV SENSING SENSITIVITY: 0.3 mV
MDC IDC STAT BRADY AP VP PERCENT: 0.01 %
MDC IDC STAT BRADY RA PERCENT PACED: 74.12 %
MDC IDC STAT BRADY RV PERCENT PACED: 0.03 %

## 2015-07-07 NOTE — Telephone Encounter (Signed)
Spoke with patient.

## 2015-07-07 NOTE — Progress Notes (Signed)
EPIC Encounter for ICM Monitoring  Patient Name: [name] is a 59 y.o. male Date: 07/07/2015 Primary Care Physican: Julian HySOUTH,STEPHEN ALAN, MD Primary Cardiologist: [place] Electrophysiologist: Allred Dry Weight: 171 lb       In the past month, have you:  1. Gained more than 2 pounds in a day or more than 5 pounds in a week? no  2. Had changes in your medications (with verification of current medications)? no  3. Had more shortness of breath than is usual for you? no  4. Limited your activity because of shortness of breath? no  5. Not been able to sleep because of shortness of breath? no  6. Had increased swelling in your feet or ankles? no  7. Had symptoms of dehydration (dizziness, dry mouth, increased thirst, decreased urine output) no  8. Had changes in sodium restriction? no  9. Been compliant with medication? Yes   ICM trend:  07/06/2015   Follow-up plan: ICM clinic phone appointment on 08/09/2015.  Optivol daily thoracic impedance trending along baseline.  Patient reported he is doing well.  No changes today.  Copy of note sent to patient's primary care physician, primary cardiologist, and device following physician.  Karie SodaLaurie S Linken Mcglothen, RN, CCM 07/07/2015 1:21 PM

## 2015-07-11 ENCOUNTER — Encounter: Payer: Self-pay | Admitting: Cardiology

## 2015-08-09 ENCOUNTER — Ambulatory Visit (INDEPENDENT_AMBULATORY_CARE_PROVIDER_SITE_OTHER): Payer: Medicare Other

## 2015-08-09 ENCOUNTER — Telehealth: Payer: Self-pay | Admitting: Cardiology

## 2015-08-09 DIAGNOSIS — I5022 Chronic systolic (congestive) heart failure: Secondary | ICD-10-CM

## 2015-08-09 DIAGNOSIS — Z95810 Presence of automatic (implantable) cardiac defibrillator: Secondary | ICD-10-CM | POA: Diagnosis not present

## 2015-08-09 NOTE — Telephone Encounter (Signed)
LMOVM reminding pt to send remote transmission.   

## 2015-08-11 ENCOUNTER — Telehealth: Payer: Self-pay

## 2015-08-11 NOTE — Telephone Encounter (Signed)
Spoke with patient.

## 2015-08-11 NOTE — Progress Notes (Signed)
EPIC Encounter for ICM Monitoring  Patient Name: [name] is a 59 y.o. male Date: 08/11/2015 Primary Care Physican: Julian HySOUTH,STEPHEN ALAN, MD Primary Cardiologist: [place] Electrophysiologist: Allred Dry Weight: 170 lb       In the past month, have you:  1. Gained more than 2 pounds in a day or more than 5 pounds in a week? no  2. Had changes in your medications (with verification of current medications)? no  3. Had more shortness of breath than is usual for you? no  4. Limited your activity because of shortness of breath? no  5. Not been able to sleep because of shortness of breath? no  6. Had increased swelling in your feet or ankles? no  7. Had symptoms of dehydration (dizziness, dry mouth, increased thirst, decreased urine output) no  8. Had changes in sodium restriction? no  9. Been compliant with medication? Yes   ICM trend: 08/09/2015   Follow-up plan: ICM clinic phone appointment on 09/13/2015.  Optivol thoracic impedance slightly below baseline 07/27/2015 to 08/06/2015 suggesting fluid retention.  He reported feeling fatigued at that time but had no other symptoms.  Impedance returning back toward baseline 08/09/2015.   He stated he has eaten foods that may be higher in sodium due to holidays.  He reported he feels fine at this time.  Advised to return to low sodium diet.  No changes today.   Copy of note sent to patient's primary care physician, primary cardiologist, and device following physician.  Karie SodaLaurie S Short, RN, CCM 08/11/2015 3:53 PM

## 2015-08-11 NOTE — Telephone Encounter (Signed)
ICM transmission received.  Attempted call to patient and left voice mail message for return call.

## 2015-08-30 ENCOUNTER — Other Ambulatory Visit: Payer: Self-pay | Admitting: Cardiology

## 2015-09-13 ENCOUNTER — Telehealth: Payer: Self-pay | Admitting: Cardiology

## 2015-09-13 ENCOUNTER — Ambulatory Visit (INDEPENDENT_AMBULATORY_CARE_PROVIDER_SITE_OTHER): Payer: Medicare Other

## 2015-09-13 DIAGNOSIS — I5022 Chronic systolic (congestive) heart failure: Secondary | ICD-10-CM

## 2015-09-13 DIAGNOSIS — Z95810 Presence of automatic (implantable) cardiac defibrillator: Secondary | ICD-10-CM | POA: Diagnosis not present

## 2015-09-13 NOTE — Telephone Encounter (Signed)
LMOVM reminding pt to send remote transmission.   

## 2015-09-15 NOTE — Progress Notes (Signed)
EPIC Encounter for ICM Monitoring  Patient Name: [name] is a 60 y.o. male Date: 09/15/2015 Primary Care Physican: Julian Hy, MD Primary Cardiologist: [place] Electrophysiologist: Allred Dry Weight: 170 lb       In the past month, have you:  1. Gained more than 2 pounds in a day or more than 5 pounds in a week? no  2. Had changes in your medications (with verification of current medications)? no  3. Had more shortness of breath than is usual for you? no  4. Limited your activity because of shortness of breath? no  5. Not been able to sleep because of shortness of breath? no  6. Had increased swelling in your feet or ankles? no  7. Had symptoms of dehydration (dizziness, dry mouth, increased thirst, decreased urine output) no  8. Had changes in sodium restriction? no  9. Been compliant with medication? Yes   ICM trend: 3 month view for 09/13/2015   ICM trend: 1 year view for 09/13/2015   Follow-up plan: ICM clinic phone appointment 10/19/2015.  Optivol thoracic impedance trending along reference line at transmission.  He denied any HF symptoms and stated he is doing well.  He stated he is trying to get meds refilled but [facility] told him MD declined to send refill.  Advised to make an appointment with [name] because the his last appointment was 05/2014.  He stated he would do so today.  No changes today.   Copy of note sent to patient's primary care physician, primary cardiologist, and device following physician.  Karie Soda, RN, CCM 09/15/2015 10:58 AM

## 2015-09-26 ENCOUNTER — Other Ambulatory Visit: Payer: Self-pay | Admitting: *Deleted

## 2015-09-26 MED ORDER — ATORVASTATIN CALCIUM 80 MG PO TABS: 80.0000 mg | ORAL_TABLET | Freq: Every day | ORAL | Status: DC

## 2015-09-26 MED ORDER — CARVEDILOL 25 MG PO TABS
ORAL_TABLET | ORAL | Status: DC
Start: 2015-09-26 — End: 2016-01-31

## 2015-09-29 ENCOUNTER — Encounter: Payer: Self-pay | Admitting: Internal Medicine

## 2015-09-29 NOTE — Progress Notes (Signed)
MRN: [id number] Name: [name]  Sex: male Age: 60 y.o. DOB: [birth year]  [facility] #: [facility] Facility/Room: Level Of Care: SNF Provider: Merrilee Seashore D Emergency Contacts: Extended Emergency Contact Information Primary Emergency Contact: Splawn,Bernadette Address: [address]          [place], [place] [address] [place] Home Phone: [phone] Mobile Phone: [phone] Relation: Spouse  Code Status:   Allergies: Morphine and related  Chief Complaint  Patient presents with  . Acute Visit    HPI: Patient is 60 y.o. male who is being seen for ankle pain for several days. Nothing has made it better or worse. Per nursing a palin film was done which was negative and an U/S done to r/o DVT which was negative. Pt has had no fever or other systemic sx.  Past Medical History  Diagnosis Date  . CAD (coronary artery disease)     Est. EF of 45% -- Successful intracoronary stenting of the mid-LAD -- Three-vessel atherosclerotic coronary artery disease.  The patient has occlusion of the mid LAD, which is his culprit lesion.  He has  moderate disease in the proximal circumflex and PDA -- Moderate left ventricular dysfunction -- [name], [name]  . Ischemic cardiomyopathy     severe ischemic cardiomyopathy -- ejection fraction 30-35%  . Polymorphic ventricular tachycardia     with VF arrest, s/p subsequent ICD  . Ejection fraction < 50%     30-35%  . HL (hearing loss)   . CHF NYHA class II      New York Heart Association class II/III heart failure  . Adrenal mass     currently being evaluated -- 4.1 cm right adrenal mass  . Acute anterior myocardial infarction 09/02/2008    post emergent stenting of the LAD in January 2010 -- anterior ST-elevation myocardial infarction  . Hyperlipidemia   . Hypertension   . Cardiac arrest - ventricular fibrillation 09/02/2008    Out of hospital cardiac arrest secondary to arrhythmia  . History of methicillin resistant  staphylococcus aureus (MRSA)     History of methicillin-sensitive Staphylococcus aureus bacteremia  with implantable cardioverter-defibrillator explant, February 23, 2009  . CHF (congestive heart failure)   . Pericarditis     history of acute pericarditis in July 2010 now resolved  . Anemia   . S/P CABG (coronary artery bypass graft) Sept 2012    Past Surgical History  Procedure Laterality Date  . Ankle surgery    . Icd implant  02/23/2009     with subsequent extraction for MSSA bacteremia  . Icd reimplantation  05/09/2009    status post implant of a Medtronic Maximo II DR dual- chamber cardioverter defibrillator by Dr. Hillis Range  . Cardiac catheterization  08/26/2008    Est. EF of 45% -- Successful intracoronary stenting of the mid-LAD -- Three-vessel atherosclerotic coronary artery disease.  The patient has occlusion of the mid LAD, which is his culprit lesion.  He has  moderate disease in the proximal circumflex and PDA -- Moderate left ventricular dysfunction -- [name], [name]  . Cardiac catheterization  03/16/2009     EF was approximately 30% -- Atherosclerotic coronary vascular disease, three-vessel --  Moderate-to-severe ischemic cardiomyopathy -- No clear evidence for an acute culprit lesion that would explain the patient's chest pain -- [name], [name].   . Coronary artery bypass graft  05/07/2011    CABG x3:  LIMA to LAD, SVG to OM2, SVG to  PDA  . Implantable cardioverter defibrillator revision  03/05/13    RV lead fracture.  A new [employer] [phone] lead was placed  . Lead revision Left 03/05/2013    Procedure: LEAD REVISION;  Surgeon: Hillis Range, MD;  Location: [facility] CATH LAB;  Service: Cardiovascular;  Laterality: Left;      Medication List       This list is accurate as of: 09/29/15 11:59 PM.  Always use your most recent med list.               aspirin EC 325 MG tablet  Take 325 mg by mouth daily.     atorvastatin 80 MG tablet  Commonly known as:   LIPITOR  Take 1 tablet (80 mg total) by mouth at bedtime.     carvedilol 25 MG tablet  Commonly known as:  COREG  TAKE 1 TABLET BY MOUTH TWICE DAILY WITH A MEAL     nitroGLYCERIN 0.4 MG SL tablet  Commonly known as:  NITROSTAT  Place 1 tablet (0.4 mg total) under the tongue every 5 (five) minutes x 3 doses as needed for chest pain. IF NEEDED     ramipril 10 MG capsule  Commonly known as:  ALTACE  TAKE 1 CAPSULE BY MOUTH TWICE DAILY     spironolactone 25 MG tablet  Commonly known as:  ALDACTONE  TAKE 1 TABLET BY MOUTH DAILY        No orders of the defined types were placed in this encounter.     There is no immunization history on file for this patient.  Social History  Substance Use Topics  . Smoking status: Current Every Day Smoker -- 37 years    Types: Cigarettes    Last Attempt to Quit: 12/18/2011  . Smokeless tobacco: Not on file     Comment: 1 pack per week. Has been smoking for 35 years.   . Alcohol Use: No    Review of Systems  DATA OBTAINED: from patient-aphasic but can motion;and nursing GENERAL:  no fevers, fatigue, appetite changes SKIN: No itching, rash HEENT: No complaint RESPIRATORY: No cough, wheezing, SOB CARDIAC: No chest pain, palpitations, lower extremity edema  GI: No abdominal pain, No N/V/D or constipation, No heartburn or reflux  GU: No dysuria, frequency or urgency, or incontinence  MUSCULOSKELETAL: r ankle pain NEUROLOGIC: No headache, dizziness  PSYCHIATRIC: No overt anxiety or sadness  There were no vitals filed for this visit.  Physical Exam  GENERAL APPEARANCE: Alert, non conversant, No acute distress  SKIN: No diaphoresis rash HEENT: Unremarkable RESPIRATORY: Breathing is even, unlabored. Lung sounds are clear   CARDIOVASCULAR: Heart RRR no murmurs, rubs or gallops. No peripheral edema  GASTROINTESTINAL: Abdomen is soft, non-tender, not distended w/ normal bowel sounds.  GENITOURINARY: Bladder non tender, not distended   MUSCULOSKELETAL: r ankle, minimal swelling, no heat but motions hurts with light palpation NEUROLOGIC: Cranial nerves 2-12 grossly intact. Moves all extremities PSYCHIATRIC: Mood and affect appropriate to situation, no behavioral issues  Patient Active Problem List   Diagnosis Date Noted  . Acute myocardial infarction, subendocardial infarction, initial episode of care (HCC) 03/04/2013  . ICD-Medtronic 06/24/2012  . S/P CABG x 3 05/31/2011  . Adrenal mass (HCC)   . METHICILLIN SUSCEPTIBLE STAPH AUREUS SEPTICEMIA 04/27/2009  . HYPERCHOLESTEROLEMIA 12/14/2008  . HYPOMAGNESEMIA 12/14/2008  . HYPOKALEMIA 12/14/2008  . TOBACCO ABUSE 12/14/2008  . ENCEPHALOPATHY, ANOXIC 12/14/2008  . HYPERTENSION 12/14/2008  . CAD (coronary artery disease) 12/14/2008  . VENTRICULAR TACHYCARDIA  12/14/2008  . Cardiac arrest (HCC) 12/14/2008  . CHF 12/14/2008  . Chronic systolic CHF (congestive heart failure) (HCC) 12/14/2008  . UNSPECIFIED CARDIOVASCULAR DISEASE 12/14/2008    CBC    Component Value Date/Time   WBC 10.4 03/05/2013 0500   RBC 5.02 03/05/2013 0500   RBC 3.41* 02/23/2009 1305   HGB 16.6 03/05/2013 0500   HCT 47.4 03/05/2013 0500   PLT 255 03/05/2013 0500   MCV 94.4 03/05/2013 0500   LYMPHSABS 1.8 03/04/2013 0840   MONOABS 0.7 03/04/2013 0840   EOSABS 0.3 03/04/2013 0840   BASOSABS 0.0 03/04/2013 0840    CMP     Component Value Date/Time   NA 136 06/15/2014 1514   K 4.8 06/15/2014 1514   CL 105 06/15/2014 1514   CO2 25 06/15/2014 1514   GLUCOSE 77 06/15/2014 1514   BUN 13 06/15/2014 1514   CREATININE 0.94 06/15/2014 1514   CREATININE 0.81 03/06/2013 0600   CALCIUM 9.6 06/15/2014 1514   PROT 7.0 06/15/2014 1547   ALBUMIN 4.2 06/15/2014 1547   AST 16 06/15/2014 1547   ALT 15 06/15/2014 1547   ALKPHOS 100 06/15/2014 1547   BILITOT 0.6 06/15/2014 1547   GFRNONAA >90 03/06/2013 0600   GFRAA >90 03/06/2013 0600    Assessment and Plan  No problem-specific assessment &  plan notes found for this encounter.   Margit Hanks, MD     This encounter was created in error - please disregard.

## 2015-10-19 ENCOUNTER — Telehealth: Payer: Self-pay | Admitting: Cardiology

## 2015-10-19 ENCOUNTER — Encounter: Payer: Medicare Other | Admitting: *Deleted

## 2015-10-19 NOTE — Telephone Encounter (Signed)
Spoke with pt and reminded pt of remote transmission that is due today. Pt verbalized understanding.   

## 2015-10-20 ENCOUNTER — Encounter: Payer: Self-pay | Admitting: Cardiology

## 2015-11-01 ENCOUNTER — Other Ambulatory Visit: Payer: Self-pay | Admitting: *Deleted

## 2015-11-01 MED ORDER — SPIRONOLACTONE 25 MG PO TABS: 25.0000 mg | ORAL_TABLET | Freq: Every day | ORAL | Status: DC

## 2015-11-03 NOTE — Progress Notes (Signed)
No ICM remote transmission received for scheduled date 10/19/2015.  Next scheduled transmission 11/29/2015.

## 2015-11-30 ENCOUNTER — Other Ambulatory Visit: Payer: Self-pay | Admitting: *Deleted

## 2015-11-30 MED ORDER — SPIRONOLACTONE 25 MG PO TABS: 25.0000 mg | ORAL_TABLET | Freq: Every day | ORAL | Status: DC

## 2015-12-04 NOTE — Progress Notes (Signed)
No ICM remote transmission received for 11/29/2015.  Next remote ICM scheduled for 12/26/2015.

## 2015-12-26 ENCOUNTER — Telehealth: Payer: Self-pay | Admitting: Cardiology

## 2015-12-26 NOTE — Telephone Encounter (Signed)
Spoke with pt and reminded pt of remote transmission that is due today. Pt verbalized understanding.   

## 2016-01-01 NOTE — Progress Notes (Signed)
No ICM transmission received that was scheduled for 12/26/2015.  Next ICM transmission scheduled for 01/16/2016.

## 2016-01-24 ENCOUNTER — Other Ambulatory Visit: Payer: Self-pay | Admitting: *Deleted

## 2016-01-24 MED ORDER — RAMIPRIL 10 MG PO CAPS: 10.0000 mg | ORAL_CAPSULE | Freq: Two times a day (BID) | ORAL | Status: DC

## 2016-01-24 MED ORDER — SPIRONOLACTONE 25 MG PO TABS: 25.0000 mg | ORAL_TABLET | Freq: Every day | ORAL | Status: DC

## 2016-01-30 NOTE — Progress Notes (Signed)
Electrophysiology Office Note Date: 01/30/2016  ID:  ANCHOR DWAN, DOB [birth year], MRN [id number]  PCP: Julian Hy, MD Primary Cardiologist: [place] Electrophysiologist: Allred  CC: Routine ICD follow-up  ALAIN DESCHENE is a 60 y.o. male seen today for Dr Johney Frame.  He presents today for routine electrophysiology followup.  Since last being seen in our clinic, the patient reports doing very well. He remains active working on [employer].  He has moved and hasn't had [facility] box hooked up at home but states he will do so.  He denies chest pain, palpitations, dyspnea, PND, orthopnea, nausea, vomiting, dizziness, syncope, edema, weight gain, or early satiety.  He has not had ICD shocks.   Device History: MDT single chamber ICD implanted 2010 for VF arrest; explanted 2/2 MSSA bacteremia 02/2009; new dual chamber ICD implant 2010; gen change with RV lead revision 2014 History of appropriate therapy: No History of AAD therapy: No   Past Medical History  Diagnosis Date  . CAD (coronary artery disease)     Est. EF of 45% -- Successful intracoronary stenting of the mid-LAD -- Three-vessel atherosclerotic coronary artery disease.  The patient has occlusion of the mid LAD, which is his culprit lesion.  He has  moderate disease in the proximal circumflex and PDA -- Moderate left ventricular dysfunction -- [name], [name]  . Ischemic cardiomyopathy     severe ischemic cardiomyopathy -- ejection fraction 30-35%  . Polymorphic ventricular tachycardia     with VF arrest, s/p subsequent ICD  . Ejection fraction < 50%     30-35%  . HL (hearing loss)   . CHF NYHA class II      New York Heart Association class II/III heart failure  . Adrenal mass     currently being evaluated -- 4.1 cm right adrenal mass  . Acute anterior myocardial infarction 09/02/2008    post emergent stenting of the LAD in January 2010 -- anterior ST-elevation myocardial infarction  . Hyperlipidemia   .  Hypertension   . Cardiac arrest - ventricular fibrillation 09/02/2008    Out of hospital cardiac arrest secondary to arrhythmia  . History of methicillin resistant staphylococcus aureus (MRSA)     History of methicillin-sensitive Staphylococcus aureus bacteremia  with implantable cardioverter-defibrillator explant, February 23, 2009  . CHF (congestive heart failure)   . Pericarditis     history of acute pericarditis in July 2010 now resolved  . Anemia   . S/P CABG (coronary artery bypass graft) Sept 2012   Past Surgical History  Procedure Laterality Date  . Ankle surgery    . Icd implant  02/23/2009     with subsequent extraction for MSSA bacteremia  . Icd reimplantation  05/09/2009    status post implant of a Medtronic Maximo II DR dual- chamber cardioverter defibrillator by Dr. Hillis Range  . Cardiac catheterization  08/26/2008    Est. EF of 45% -- Successful intracoronary stenting of the mid-LAD -- Three-vessel atherosclerotic coronary artery disease.  The patient has occlusion of the mid LAD, which is his culprit lesion.  He has  moderate disease in the proximal circumflex and PDA -- Moderate left ventricular dysfunction -- [name], [name]  . Cardiac catheterization  03/16/2009     EF was approximately 30% -- Atherosclerotic coronary vascular disease, three-vessel --  Moderate-to-severe ischemic cardiomyopathy -- No clear evidence for an acute culprit lesion that would explain the patient's chest pain -- [name], [name].   . Coronary  artery bypass graft  05/07/2011    CABG x3:  LIMA to LAD, SVG to OM2, SVG to PDA  . Implantable cardioverter defibrillator revision  03/05/13    RV lead fracture.  A new [employer] [id number] lead was placed  . Lead revision Left 03/05/2013    Procedure: LEAD REVISION;  Surgeon: Hillis RangeJames Allred, MD;  [facility];  Service: Cardiovascular;  Laterality: Left;    Current Outpatient Prescriptions  Medication Sig Dispense Refill  . aspirin EC  325 MG tablet Take 325 mg by mouth daily.      Marland Kitchen. atorvastatin (LIPITOR) 80 MG tablet Take 1 tablet (80 mg total) by mouth at bedtime. 30 tablet 0  . carvedilol (COREG) 25 MG tablet TAKE 1 TABLET BY MOUTH TWICE DAILY WITH A MEAL 60 tablet 0  . nitroGLYCERIN (NITROSTAT) 0.4 MG SL tablet Place 1 tablet (0.4 mg total) under the tongue every 5 (five) minutes x 3 doses as needed for chest pain. IF NEEDED 25 tablet 6  . ramipril (ALTACE) 10 MG capsule Take 1 capsule (10 mg total) by mouth 2 (two) times daily. KEEP OV. 180 capsule 0  . spironolactone (ALDACTONE) 25 MG tablet Take 1 tablet (25 mg total) by mouth daily. 16 tablet 0   No current facility-administered medications for this visit.    Allergies:   Morphine and related   Social History: Social History   Social History  . Marital Status: Married    Spouse Name: N/A  . Number of Children: 3  . Years of Education: N/A   Occupational History  . [employer]    Social History Main Topics  . Smoking status: Current Every Day Smoker -- 37 years    Types: Cigarettes    Last Attempt to Quit: 12/18/2011  . Smokeless tobacco: Not on file     Comment: 1 pack per week. Has been smoking for 35 years.   . Alcohol Use: No  . Drug Use: No  . Sexual Activity: Not on file   Other Topics Concern  . Not on file   Social History Narrative   SOCIAL HISTORY:  He is married for 37 years with children.  He smokes 1 pack per day. He has been smoking for 37 years.  He denies alcohol use.  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         FAMILY HISTORY:  Father has a history of coronary artery disease. Mother died in her 270s with a stroke.  He has 2 sisters who are alive and well.     Family History: Family History  Problem Relation Age of Onset  . Stroke Mother   . Coronary artery disease Father     Review of Systems: All other systems reviewed and are otherwise negative except as noted above.   Physical Exam: VS:  There were no vitals taken  for this visit. , BMI There is no weight on file to calculate BMI.  GEN- The patient is well appearing, alert and oriented x 3 today.   HEENT: normocephalic, atraumatic; sclera clear, conjunctiva pink; hearing intact; oropharynx clear; neck supple  Lungs- Clear to ausculation bilaterally, normal work of breathing.  No wheezes, rales, rhonchi Heart- Regular rate and rhythm, no murmurs, rubs or gallops  GI- soft, non-tender, non-distended, bowel sounds present  Extremities- no clubbing, cyanosis, or edema; DP/PT/radial pulses 2+ bilaterally MS- no significant deformity or atrophy Skin- warm and dry, no rash or lesion; ICD pocket well healed Psych-  euthymic mood, full affect Neuro- strength and sensation are intact  ICD interrogation- reviewed in detail today,  See PACEART report  EKG:  EKG is ordered today. The ekg ordered today shows sinus rhythm, rate 67  Recent Labs: No results found for requested labs within last 365 days.   Wt Readings from Last 3 Encounters:  12/19/14 177 lb 3.2 oz (80.377 kg)  06/15/14 181 lb 8 oz (82.328 kg)  12/08/13 184 lb 6.4 oz (83.643 kg)     Other studies Reviewed: Additional studies/ records that were reviewed today include: Dr Jenel Lucks office notes  Assessment and Plan:  1.  Chronic systolic dysfunction euvolemic today Stable on an appropriate medical regimen Normal ICD function See Pace Art report No changes today BMET today  2.  CAD/ICM No recent ischemic symptoms Continue ASA/BB/statin Will defer to [name] if ASA can be decreased to  daily Lipids, LFT's today   3.  VF No recurrent ventricular arrhythmias  4.  Tobacco abuse Pt not willing to quit     Current medicines are reviewed at length with the patient today.   The patient does not have concerns regarding his medicines.  The following changes were made today:  none  Labs/ tests ordered today include: BMET, LFT's, lipids     Disposition:   Follow up with [facility]  transmissions, [name] 6 months, me in 1 year   Signed, Gypsy Balsam, NP 01/30/2016 11:57 AM  [facility] [address] [place] [address] [phone] (office) [phone] (fax)

## 2016-01-31 ENCOUNTER — Encounter: Payer: Self-pay | Admitting: Nurse Practitioner

## 2016-01-31 ENCOUNTER — Ambulatory Visit (INDEPENDENT_AMBULATORY_CARE_PROVIDER_SITE_OTHER): Payer: Medicare Other | Admitting: Nurse Practitioner

## 2016-01-31 ENCOUNTER — Encounter: Payer: Self-pay | Admitting: Internal Medicine

## 2016-01-31 VITALS — BP 140/90 | HR 72 | Ht 67.0 in | Wt 173.2 lb

## 2016-01-31 DIAGNOSIS — E785 Hyperlipidemia, unspecified: Secondary | ICD-10-CM

## 2016-01-31 DIAGNOSIS — I255 Ischemic cardiomyopathy: Secondary | ICD-10-CM

## 2016-01-31 DIAGNOSIS — I5022 Chronic systolic (congestive) heart failure: Secondary | ICD-10-CM | POA: Diagnosis not present

## 2016-01-31 DIAGNOSIS — I4901 Ventricular fibrillation: Secondary | ICD-10-CM | POA: Diagnosis not present

## 2016-01-31 LAB — BASIC METABOLIC PANEL
BUN: 11 mg/dL (ref 7–25)
CALCIUM: 9.1 mg/dL (ref 8.6–10.3)
CO2: 21 mmol/L (ref 20–31)
CREATININE: 0.83 mg/dL (ref 0.70–1.33)
Chloride: 102 mmol/L (ref 98–110)
GLUCOSE: 78 mg/dL (ref 65–99)
Potassium: 4.6 mmol/L (ref 3.5–5.3)
Sodium: 137 mmol/L (ref 135–146)

## 2016-01-31 LAB — HEPATIC FUNCTION PANEL
ALT: 9 U/L (ref 9–46)
AST: 13 U/L (ref 10–35)
Albumin: 4 g/dL (ref 3.6–5.1)
Alkaline Phosphatase: 93 U/L (ref 40–115)
BILIRUBIN DIRECT: 0.1 mg/dL (ref <=0.2)
BILIRUBIN INDIRECT: 0.3 mg/dL (ref 0.2–1.2)
BILIRUBIN TOTAL: 0.4 mg/dL (ref 0.2–1.2)
Total Protein: 6.7 g/dL (ref 6.1–8.1)

## 2016-01-31 LAB — LIPID PANEL
CHOL/HDL RATIO: 6.4 ratio — AB (ref <=5.0)
CHOLESTEROL: 212 mg/dL — AB (ref 125–200)
HDL: 33 mg/dL — ABNORMAL LOW (ref >=40)
LDL Cholesterol: 165 mg/dL — ABNORMAL HIGH (ref <130)
Triglycerides: 71 mg/dL (ref <150)
VLDL: 14 mg/dL (ref <30)

## 2016-01-31 MED ORDER — ATORVASTATIN CALCIUM 80 MG PO TABS: 80.0000 mg | ORAL_TABLET | Freq: Every day | ORAL | Status: DC

## 2016-01-31 MED ORDER — CARVEDILOL 25 MG PO TABS: ORAL_TABLET | ORAL | Status: DC

## 2016-01-31 MED ORDER — SPIRONOLACTONE 25 MG PO TABS
25.0000 mg | ORAL_TABLET | Freq: Every day | ORAL | Status: DC
Start: 2016-01-31 — End: 2016-02-26

## 2016-01-31 MED ORDER — RAMIPRIL 10 MG PO CAPS: 10.0000 mg | ORAL_CAPSULE | Freq: Two times a day (BID) | ORAL | Status: DC

## 2016-01-31 NOTE — Progress Notes (Signed)
Next ICM remote transmission scheduled for 03/06/2016.  Patient has in office appointment, with Gypsy BalsamAmber Seiler, NP, for defib check today.

## 2016-01-31 NOTE — Patient Instructions (Signed)
Medication Instructions:   Your physician recommends that you continue on your current medications as directed. Please refer to the Current Medication list given to you today.   If you need a refill on your cardiac medications before your next appointment, please call your pharmacy.  Labwork:  BMET LFT AND LIPID TODAY   Testing/Procedures:  NONE ORDER TODAY    Follow-Up:  Your physician wants you to follow-up in: ONE YEAR WITH  ALLRED You will receive a reminder letter in the mail two months in advance. If you don't receive a letter, please call our office to schedule the follow-up appointment.  Your physician wants you to follow-up in:  IN  6 MONTHS WITH DR [place]andJORDAN   You will receive a reminder letter in the mail two months in advance. If you don't receive a letter, please call our office to schedule the follow-up appointment.  Remote monitoring is used to monitor your Pacemaker of ICD from home. This monitoring reduces the number of office visits required to check your device to one time per year. It allows us to keep an eye on the functioning of your device to ensure it is working properly. You are scheduled for a device check from home on .  05/01/2016..You may send your transmission at any time that day. If you have a wireless device, the transmission will be sent automatically. After your physician reviews your transmission, you will receive a postcard with your next transmission date.       Any Other Special Instructions Will Be Listed Below (If Applicable).

## 2016-02-01 ENCOUNTER — Telehealth: Payer: Self-pay | Admitting: *Deleted

## 2016-02-01 NOTE — Progress Notes (Signed)
[facility] seen 3[facility] yrs ago

## 2016-02-01 NOTE — Telephone Encounter (Signed)
-----   Message from Marily LenteAmber K Seiler, NP sent at 01/31/2016  6:12 PM EDT ----- Please notify patient of lab results.  Lipids worse than last year, check to see if he has been taking statin.  Kidney function, potassium, liver function stable.

## 2016-02-02 ENCOUNTER — Other Ambulatory Visit: Payer: Self-pay | Admitting: *Deleted

## 2016-02-02 ENCOUNTER — Telehealth: Payer: Self-pay | Admitting: *Deleted

## 2016-02-02 DIAGNOSIS — E7800 Pure hypercholesterolemia, unspecified: Secondary | ICD-10-CM

## 2016-02-02 NOTE — Telephone Encounter (Signed)
-----   Message from Amber K Seiler, NP sent at 01/31/2016  6:12 PM EDT ----- Please notify patient of lab results.  Lipids worse than last year, check to see if he has been taking statin.  Kidney function, potassium, liver function stable. 

## 2016-02-26 ENCOUNTER — Other Ambulatory Visit: Payer: Self-pay | Admitting: Cardiology

## 2016-02-26 DIAGNOSIS — I4901 Ventricular fibrillation: Secondary | ICD-10-CM

## 2016-02-26 DIAGNOSIS — I255 Ischemic cardiomyopathy: Secondary | ICD-10-CM

## 2016-02-26 DIAGNOSIS — I5022 Chronic systolic (congestive) heart failure: Secondary | ICD-10-CM

## 2016-02-26 MED ORDER — SPIRONOLACTONE 25 MG PO TABS: 25.0000 mg | ORAL_TABLET | Freq: Every day | ORAL | Status: DC

## 2016-02-26 NOTE — Telephone Encounter (Signed)
Rx request sent to pharmacy.  

## 2016-03-06 ENCOUNTER — Telehealth: Payer: Self-pay | Admitting: Cardiology

## 2016-03-06 ENCOUNTER — Ambulatory Visit (INDEPENDENT_AMBULATORY_CARE_PROVIDER_SITE_OTHER): Payer: Medicare Other

## 2016-03-06 DIAGNOSIS — I5022 Chronic systolic (congestive) heart failure: Secondary | ICD-10-CM | POA: Diagnosis not present

## 2016-03-06 DIAGNOSIS — Z95810 Presence of automatic (implantable) cardiac defibrillator: Secondary | ICD-10-CM | POA: Diagnosis not present

## 2016-03-06 NOTE — Telephone Encounter (Signed)
Spoke with pt and reminded pt of remote transmission that is due today. Pt verbalized understanding.   

## 2016-03-07 ENCOUNTER — Telehealth: Payer: Self-pay

## 2016-03-07 NOTE — Progress Notes (Signed)
EPIC Encounter for ICM Monitoring  Patient Name: Billy JacquetRobert E Polyak is a 60 y.o. male Date: 03/07/2016 Primary Care Physican: Julian HySOUTH,STEPHEN ALAN, MD Primary Cardiologist: [place] Electrophysiologist: Allred Dry Weight: unknown        Attempted patient call and unable to reach.   Thoracic impedence stable.   ICM trend: 03/06/2016    Follow-up plan: ICM clinic phone appointment on 04/10/2016.  Copy of ICM check sent to device physician.   Karie SodaLaurie S Short, RN 03/07/2016 1:19 PM

## 2016-03-07 NOTE — Telephone Encounter (Signed)
Remote ICM transmission received.  Attempted patient call and left message for return call.   

## 2016-03-11 ENCOUNTER — Other Ambulatory Visit (INDEPENDENT_AMBULATORY_CARE_PROVIDER_SITE_OTHER): Payer: Medicare Other | Admitting: *Deleted

## 2016-03-11 DIAGNOSIS — E7800 Pure hypercholesterolemia, unspecified: Secondary | ICD-10-CM | POA: Diagnosis not present

## 2016-03-11 LAB — LIPID PANEL
CHOL/HDL RATIO: 4.7 ratio (ref <=5.0)
Cholesterol: 112 mg/dL — ABNORMAL LOW (ref 125–200)
HDL: 24 mg/dL — ABNORMAL LOW (ref >=40)
LDL CALC: 72 mg/dL (ref <130)
Triglycerides: 81 mg/dL (ref <150)
VLDL: 16 mg/dL (ref <30)

## 2016-03-13 ENCOUNTER — Telehealth: Payer: Self-pay | Admitting: *Deleted

## 2016-03-13 NOTE — Telephone Encounter (Signed)
-----   Message from Marily LenteAmber K Seiler, NP sent at 03/12/2016  7:50 PM EDT ----- Please notify patient of significantly improved lipids on daily statin.  He should continue.  Please verify that he is taking dose listed on med list.

## 2016-03-13 NOTE — Telephone Encounter (Signed)
-----   Message from Amber K Seiler, NP sent at 03/12/2016  7:50 PM EDT ----- Please notify patient of significantly improved lipids on daily statin.  He should continue.  Please verify that he is taking dose listed on med list. 

## 2016-04-10 ENCOUNTER — Ambulatory Visit (INDEPENDENT_AMBULATORY_CARE_PROVIDER_SITE_OTHER): Payer: Medicare Other

## 2016-04-10 ENCOUNTER — Telehealth: Payer: Self-pay

## 2016-04-10 DIAGNOSIS — Z95810 Presence of automatic (implantable) cardiac defibrillator: Secondary | ICD-10-CM | POA: Diagnosis not present

## 2016-04-10 DIAGNOSIS — I5022 Chronic systolic (congestive) heart failure: Secondary | ICD-10-CM | POA: Diagnosis not present

## 2016-04-10 NOTE — Progress Notes (Signed)
EPIC Encounter for ICM Monitoring  Patient Name: Suezanne JacquetRobert E Gu is a 60 y.o. male Date: 04/10/2016 Primary Care Physican: Julian HySOUTH,STEPHEN ALAN, MD Primary Cardiologist: [place] Electrophysiologist: Allred Dry Weight: unknown      Attempted patient call and left detailed message.  Transmission reviewed.   Thoracic impedance normal.   Follow-up plan: ICM clinic phone appointment on 05/14/2016.  Copy of ICM check sent to device physician.   ICM trend: 04/10/2016       Karie SodaLaurie S Josephus Harriger, RN 04/10/2016 8:50 AM

## 2016-04-10 NOTE — Telephone Encounter (Signed)
Remote ICM transmission received.  Attempted patient call and left detailed message and to return call.   

## 2016-05-14 ENCOUNTER — Telehealth: Payer: Self-pay

## 2016-05-14 ENCOUNTER — Ambulatory Visit (INDEPENDENT_AMBULATORY_CARE_PROVIDER_SITE_OTHER): Payer: Medicare Other | Admitting: *Deleted

## 2016-05-14 DIAGNOSIS — Z95810 Presence of automatic (implantable) cardiac defibrillator: Secondary | ICD-10-CM

## 2016-05-14 DIAGNOSIS — I5022 Chronic systolic (congestive) heart failure: Secondary | ICD-10-CM

## 2016-05-14 DIAGNOSIS — I255 Ischemic cardiomyopathy: Secondary | ICD-10-CM

## 2016-05-14 NOTE — Telephone Encounter (Signed)
Remote ICM transmission received.  Attempted patient call and left message for return call.   

## 2016-05-14 NOTE — Progress Notes (Signed)
EPIC Encounter for ICM Monitoring  Patient Name: Billy JacquetRobert E [name] is a 60 y.o. male Date: 05/14/2016 Primary Care Physican: Julian HySOUTH,STEPHEN ALAN, MD Primary Cardiologist: [place] Electrophysiologist: Allred Dry Weight:  unknown      Attempted ICM call and unable to reach.  Transmission reviewed.   Thoracic impedance normal.  Recommendations:  None, unable to reach patient.    Follow-up plan: ICM clinic phone appointment on 06/14/2016.  Copy of ICM check sent to device physician.   ICM trend: 05/14/2016       Karie SodaLaurie S Short, RN 05/14/2016 12:13 PM

## 2016-05-14 NOTE — Progress Notes (Signed)
Remote ICD transmission.   

## 2016-05-15 ENCOUNTER — Encounter: Payer: Self-pay | Admitting: Cardiology

## 2016-05-31 ENCOUNTER — Encounter: Payer: Self-pay | Admitting: Cardiology

## 2016-06-11 LAB — CUP PACEART REMOTE DEVICE CHECK
Battery Remaining Longevity: 84 mo
Battery Voltage: 3 V
Brady Statistic AP VP Percent: 0.01 %
Brady Statistic AS VS Percent: 3.26 %
Brady Statistic RA Percent Paced: 96.74 %
HIGH POWER IMPEDANCE MEASURED VALUE: 72 Ohm
Implantable Lead Implant Date: 20100414
Implantable Lead Location: 753859
Implantable Lead Location: 753860
Implantable Lead Model: 181
Lead Channel Impedance Value: 323 Ohm
Lead Channel Impedance Value: 513 Ohm
Lead Channel Pacing Threshold Amplitude: 1 V
Lead Channel Pacing Threshold Pulse Width: 0.4 ms
Lead Channel Pacing Threshold Pulse Width: 0.4 ms
Lead Channel Sensing Intrinsic Amplitude: 6.375 mV
Lead Channel Sensing Intrinsic Amplitude: 6.375 mV
Lead Channel Setting Pacing Amplitude: 2 V
Lead Channel Setting Pacing Pulse Width: 0.4 ms
Lead Channel Setting Sensing Sensitivity: 0.3 mV
MDC IDC LEAD IMPLANT DT: 20140711
MDC IDC LEAD SERIAL: 323897
MDC IDC MSMT LEADCHNL RA SENSING INTR AMPL: 1.875 mV
MDC IDC MSMT LEADCHNL RA SENSING INTR AMPL: 1.875 mV
MDC IDC MSMT LEADCHNL RV IMPEDANCE VALUE: 323 Ohm
MDC IDC MSMT LEADCHNL RV PACING THRESHOLD AMPLITUDE: 1.875 V
MDC IDC SESS DTM: 20170919041707
MDC IDC SET LEADCHNL RV PACING AMPLITUDE: 2.5 V
MDC IDC STAT BRADY AP VS PERCENT: 96.73 %
MDC IDC STAT BRADY AS VP PERCENT: 0 %
MDC IDC STAT BRADY RV PERCENT PACED: 0.01 %

## 2016-06-14 ENCOUNTER — Telehealth: Payer: Self-pay

## 2016-06-14 ENCOUNTER — Ambulatory Visit (INDEPENDENT_AMBULATORY_CARE_PROVIDER_SITE_OTHER): Payer: Medicare Other

## 2016-06-14 DIAGNOSIS — I5022 Chronic systolic (congestive) heart failure: Secondary | ICD-10-CM

## 2016-06-14 DIAGNOSIS — Z95810 Presence of automatic (implantable) cardiac defibrillator: Secondary | ICD-10-CM

## 2016-06-14 NOTE — Progress Notes (Signed)
EPIC Encounter for ICM Monitoring  Patient Name: [name] is a 60 y.o. male Date: 06/14/2016 Primary Care Physican: Julian HySOUTH,STEPHEN ALAN, MD Primary Cardiologist:Jordan Electrophysiologist: Allred Dry Weight:    unknown      Attempted ICM call and unable to reach. Left detailed message regarding transmission.  Transmission reviewed.   Thoracic impedance normal   Follow-up plan: ICM clinic phone appointment on 07/15/2016.  Copy of ICM check sent to device physician.   ICM trend: 06/14/2016       Karie SodaLaurie S Petrita Blunck, RN 06/14/2016 9:31 AM

## 2016-06-14 NOTE — Telephone Encounter (Signed)
Remote ICM transmission received.  Attempted patient call and left detailed message regarding transmission and next ICM scheduled for 07/15/2016.  Advised to return call for any fluid symptoms or questions.    

## 2016-07-15 ENCOUNTER — Ambulatory Visit (INDEPENDENT_AMBULATORY_CARE_PROVIDER_SITE_OTHER): Payer: Medicare Other

## 2016-07-15 ENCOUNTER — Telehealth: Payer: Self-pay

## 2016-07-15 DIAGNOSIS — I5022 Chronic systolic (congestive) heart failure: Secondary | ICD-10-CM

## 2016-07-15 DIAGNOSIS — Z95810 Presence of automatic (implantable) cardiac defibrillator: Secondary | ICD-10-CM | POA: Diagnosis not present

## 2016-07-15 NOTE — Progress Notes (Signed)
EPIC Encounter for ICM Monitoring  Patient Name: Billy JacquetRobert E Reiley is a 60 y.o. male Date: 07/15/2016 Primary Care Physican: Julian HySOUTH,STEPHEN ALAN, MD Primary Cardiologist:Jordan Electrophysiologist: Allred Dry Weight:unknown          Heart Failure questions reviewed, pt asymptomatic   Thoracic impedance normal   Recommendations: No changes. Reminded him holiday foods will be high in sodium. Reinforced low salt food choices and limiting fluid intake to < 2 liters per day. Encouraged to call for fluid symptoms.    Follow-up plan: ICM clinic phone appointment on 08/15/2016.  Copy of ICM check sent to device physician.   ICM trend: 07/15/2016       Karie SodaLaurie S Short, RN 07/15/2016 5:03 PM

## 2016-07-15 NOTE — Telephone Encounter (Signed)
Remote ICM transmission received.  Spoke with patient.   

## 2016-08-15 ENCOUNTER — Telehealth: Payer: Self-pay

## 2016-08-15 ENCOUNTER — Ambulatory Visit (INDEPENDENT_AMBULATORY_CARE_PROVIDER_SITE_OTHER): Payer: Medicare Other | Admitting: *Deleted

## 2016-08-15 DIAGNOSIS — I5022 Chronic systolic (congestive) heart failure: Secondary | ICD-10-CM

## 2016-08-15 DIAGNOSIS — Z95810 Presence of automatic (implantable) cardiac defibrillator: Secondary | ICD-10-CM

## 2016-08-15 DIAGNOSIS — I255 Ischemic cardiomyopathy: Secondary | ICD-10-CM

## 2016-08-15 NOTE — Progress Notes (Signed)
EPIC Encounter for ICM Monitoring  Patient Name: Billy JacquetRobert E Lahm is a 60 y.o. male Date: 08/15/2016 Primary Care Physican: Julian HySOUTH,STEPHEN ALAN, MD Primary Cardiologist:Jordan Electrophysiologist: Allred Dry Weight:unknown                 Attempted ICM call and unable to reach.  Left detailed message regarding transmission.  Transmission reviewed.   Thoracic impedance normal.  Recommendations: Provided ICM direct number. Encouraged to call for fluid symptoms.    Follow-up plan: ICM clinic phone appointment on 09/17/2016.  Copy of ICM check sent to device physician.   ICM trend: 08/15/2016       Karie SodaLaurie S Short, RN 08/15/2016 11:46 AM

## 2016-08-15 NOTE — Progress Notes (Signed)
Remote ICD transmission.   

## 2016-08-15 NOTE — Telephone Encounter (Signed)
Remote ICM transmission received.  Attempted patient call and left detailed message regarding transmission and next ICM scheduled for 09/17/2016.  Advised to return call for any fluid symptoms or questions.    

## 2016-08-16 LAB — CUP PACEART REMOTE DEVICE CHECK
Brady Statistic AP VS Percent: 94.71 %
Brady Statistic AS VP Percent: 0 %
Brady Statistic AS VS Percent: 5.29 %
HighPow Impedance: 67 Ohm
Implantable Lead Implant Date: 20140711
Implantable Lead Location: 753859
Implantable Lead Model: 5076
Lead Channel Impedance Value: 323 Ohm
Lead Channel Impedance Value: 494 Ohm
Lead Channel Pacing Threshold Amplitude: 1.875 V
Lead Channel Pacing Threshold Pulse Width: 0.4 ms
Lead Channel Sensing Intrinsic Amplitude: 1.75 mV
Lead Channel Sensing Intrinsic Amplitude: 1.75 mV
Lead Channel Sensing Intrinsic Amplitude: 5.375 mV
Lead Channel Setting Pacing Amplitude: 2.5 V
Lead Channel Setting Pacing Pulse Width: 0.4 ms
MDC IDC LEAD IMPLANT DT: 20100414
MDC IDC LEAD LOCATION: 753860
MDC IDC LEAD MODEL: 181
MDC IDC LEAD SERIAL: 323897
MDC IDC MSMT BATTERY REMAINING LONGEVITY: 80 mo
MDC IDC MSMT BATTERY VOLTAGE: 3 V
MDC IDC MSMT LEADCHNL RA PACING THRESHOLD AMPLITUDE: 1 V
MDC IDC MSMT LEADCHNL RA PACING THRESHOLD PULSEWIDTH: 0.4 ms
MDC IDC MSMT LEADCHNL RV IMPEDANCE VALUE: 323 Ohm
MDC IDC MSMT LEADCHNL RV SENSING INTR AMPL: 5.375 mV
MDC IDC PG IMPLANT DT: 20140711
MDC IDC SESS DTM: 20171221072827
MDC IDC SET LEADCHNL RA PACING AMPLITUDE: 2 V
MDC IDC SET LEADCHNL RV SENSING SENSITIVITY: 0.3 mV
MDC IDC STAT BRADY AP VP PERCENT: 0 %
MDC IDC STAT BRADY RA PERCENT PACED: 94.63 %
MDC IDC STAT BRADY RV PERCENT PACED: 0 %

## 2016-09-17 ENCOUNTER — Ambulatory Visit (INDEPENDENT_AMBULATORY_CARE_PROVIDER_SITE_OTHER): Payer: Medicare Other

## 2016-09-17 DIAGNOSIS — Z95810 Presence of automatic (implantable) cardiac defibrillator: Secondary | ICD-10-CM

## 2016-09-17 DIAGNOSIS — I5022 Chronic systolic (congestive) heart failure: Secondary | ICD-10-CM

## 2016-09-20 NOTE — Progress Notes (Signed)
EPIC Encounter for ICM Monitoring  Patient Name: [name] is a 61 y.o. male Date: 09/20/2016 Primary Care Physican: Julian HySOUTH,STEPHEN ALAN, MD Primary Cardiologist:Jordan Electrophysiologist: Allred Dry Weight:unknown      Transmission reviewed.   Thoracic impedance normal   Follow-up plan: ICM clinic phone appointment on 10/21/2016.  Copy of ICM check sent to device physician.   3 month ICM trend: 09/17/2016   1 Year ICM trend:      Karie SodaLaurie S Short, RN 09/20/2016 9:33 AM

## 2016-10-21 ENCOUNTER — Telehealth: Payer: Self-pay

## 2016-10-21 ENCOUNTER — Ambulatory Visit (INDEPENDENT_AMBULATORY_CARE_PROVIDER_SITE_OTHER): Payer: Medicare Other

## 2016-10-21 DIAGNOSIS — Z95810 Presence of automatic (implantable) cardiac defibrillator: Secondary | ICD-10-CM

## 2016-10-21 DIAGNOSIS — I5022 Chronic systolic (congestive) heart failure: Secondary | ICD-10-CM | POA: Diagnosis not present

## 2016-10-21 NOTE — Telephone Encounter (Signed)
Remote ICM transmission received.  Attempted patient call and left message to return call.   

## 2016-10-21 NOTE — Progress Notes (Signed)
EPIC Encounter for ICM Monitoring  Patient Name: Suezanne JacquetRobert E Pohle is a 61 y.o. male Date: 10/21/2016 Primary Care Physican: Julian HySOUTH,STEPHEN ALAN, MD Primary Cardiologist:Jordan Electrophysiologist: Allred Dry Weight:unknown       Attempted call to patient and unable to reach.  Left message to return call.  Transmission reviewed.    Thoracic impedance normal.  Not currently prescribed diuretic  Recommendations:NONE - Unable to reach patient   Follow-up plan: ICM clinic phone appointment on 11/21/2016.  Copy of ICM check sent to device physician.   3 month ICM trend: 10/21/2016   1 Year ICM trend:      Karie SodaLaurie S Alaynah Schutter, RN 10/21/2016 12:56 PM

## 2016-11-21 ENCOUNTER — Ambulatory Visit (INDEPENDENT_AMBULATORY_CARE_PROVIDER_SITE_OTHER): Payer: Medicare Other | Admitting: *Deleted

## 2016-11-21 DIAGNOSIS — I5022 Chronic systolic (congestive) heart failure: Secondary | ICD-10-CM

## 2016-11-21 DIAGNOSIS — Z95810 Presence of automatic (implantable) cardiac defibrillator: Secondary | ICD-10-CM | POA: Diagnosis not present

## 2016-11-21 DIAGNOSIS — I4901 Ventricular fibrillation: Secondary | ICD-10-CM

## 2016-11-21 NOTE — Progress Notes (Signed)
Remote ICD transmission.   

## 2016-11-21 NOTE — Progress Notes (Signed)
EPIC Encounter for ICM Monitoring  Patient Name: Billy JacquetRobert E Clairmont is a 61 y.o. male Date: 11/21/2016 Primary Care Physican: Julian HySOUTH,STEPHEN ALAN, MD Primary Cardiologist:Jordan Electrophysiologist: Allred Dry Weight:unknown      Attempted call to patient and unable to reach.  Left message to return call.  Transmission reviewed.    Thoracic impedance normal.  Not currently prescribed diuretic  Recommendations:NONE - Unable to reach patient   Follow-up plan: ICM clinic phone appointment on 12/24/2016.  Copy of ICM check sent to device physician.   3 month ICM trend: 11/21/2016   1 Year ICM trend:      Karie SodaLaurie S Short, RN 11/21/2016 2:58 PM

## 2016-11-22 ENCOUNTER — Encounter: Payer: Self-pay | Admitting: Cardiology

## 2016-11-22 LAB — CUP PACEART REMOTE DEVICE CHECK
Battery Remaining Longevity: 75 mo
Battery Voltage: 2.99 V
Brady Statistic AS VS Percent: 4.12 %
Date Time Interrogation Session: 20180329062605
HighPow Impedance: 69 Ohm
Implantable Lead Implant Date: 20100414
Implantable Lead Model: 5076
Implantable Pulse Generator Implant Date: 20140711
Lead Channel Impedance Value: 323 Ohm
Lead Channel Impedance Value: 513 Ohm
Lead Channel Pacing Threshold Amplitude: 1.125 V
Lead Channel Pacing Threshold Pulse Width: 0.4 ms
Lead Channel Pacing Threshold Pulse Width: 0.4 ms
Lead Channel Sensing Intrinsic Amplitude: 1.75 mV
Lead Channel Sensing Intrinsic Amplitude: 1.75 mV
Lead Channel Setting Sensing Sensitivity: 0.3 mV
MDC IDC LEAD IMPLANT DT: 20140711
MDC IDC LEAD LOCATION: 753859
MDC IDC LEAD LOCATION: 753860
MDC IDC LEAD SERIAL: 323897
MDC IDC MSMT LEADCHNL RV IMPEDANCE VALUE: 323 Ohm
MDC IDC MSMT LEADCHNL RV PACING THRESHOLD AMPLITUDE: 1.875 V
MDC IDC MSMT LEADCHNL RV SENSING INTR AMPL: 5.625 mV
MDC IDC MSMT LEADCHNL RV SENSING INTR AMPL: 5.625 mV
MDC IDC SET LEADCHNL RA PACING AMPLITUDE: 2.25 V
MDC IDC SET LEADCHNL RV PACING AMPLITUDE: 2.5 V
MDC IDC SET LEADCHNL RV PACING PULSEWIDTH: 0.4 ms
MDC IDC STAT BRADY AP VP PERCENT: 0 %
MDC IDC STAT BRADY AP VS PERCENT: 95.88 %
MDC IDC STAT BRADY AS VP PERCENT: 0 %
MDC IDC STAT BRADY RA PERCENT PACED: 95.81 %
MDC IDC STAT BRADY RV PERCENT PACED: 0 %

## 2016-12-06 ENCOUNTER — Encounter: Payer: Self-pay | Admitting: Cardiology

## 2016-12-24 ENCOUNTER — Ambulatory Visit (INDEPENDENT_AMBULATORY_CARE_PROVIDER_SITE_OTHER): Payer: Medicare Other

## 2016-12-24 DIAGNOSIS — I5022 Chronic systolic (congestive) heart failure: Secondary | ICD-10-CM | POA: Diagnosis not present

## 2016-12-24 DIAGNOSIS — Z95810 Presence of automatic (implantable) cardiac defibrillator: Secondary | ICD-10-CM

## 2016-12-26 ENCOUNTER — Telehealth: Payer: Self-pay

## 2016-12-26 NOTE — Telephone Encounter (Signed)
Attempted ICM call and recoding stated unable to complete call.

## 2016-12-26 NOTE — Progress Notes (Signed)
EPIC Encounter for ICM Monitoring  Patient Name: [name] is a 61 y.o. male Date: 12/26/2016 Primary Care Physican: Julian HySOUTH,STEPHEN ALAN, MD Primary Cardiologist:Jordan Electrophysiologist: Allred Dry Weight:unknown       Attempted call to patient and unable to reach.    Transmission reviewed.    Thoracic impedance normal.  Prescribed spironolactone  Recommendations: NONE - Unable to reach patient   Follow-up plan: ICM clinic phone appointment on 03/06/2017 since he has defib office appointment scheduled on 02/03/2017 with Dr Johney FrameAllred.  Also has office visit with Dr SwazilandJordan 02/07/2017.  Copy of ICM check sent to device physician.   3 month ICM trend: 12/24/2016   1 Year ICM trend:      Karie SodaLaurie S Gerlene Glassburn, RN 12/26/2016 11:07 AM

## 2017-02-03 ENCOUNTER — Ambulatory Visit (INDEPENDENT_AMBULATORY_CARE_PROVIDER_SITE_OTHER): Payer: Medicare Other | Admitting: Internal Medicine

## 2017-02-03 ENCOUNTER — Encounter: Payer: Self-pay | Admitting: Internal Medicine

## 2017-02-03 VITALS — BP 102/78 | HR 84 | Ht 67.0 in | Wt 159.6 lb

## 2017-02-03 DIAGNOSIS — I4901 Ventricular fibrillation: Secondary | ICD-10-CM | POA: Diagnosis not present

## 2017-02-03 DIAGNOSIS — I5022 Chronic systolic (congestive) heart failure: Secondary | ICD-10-CM

## 2017-02-03 NOTE — Patient Instructions (Signed)
Medication Instructions:  Your physician recommends that you continue on your current medications as directed. Please refer to the Current Medication list given to you today.   Labwork: None ordered   Testing/Procedures: None ordered   Follow-Up: Remote monitoring is used to monitor your  ICD from home. This monitoring reduces the number of office visits required to check your device to one time per year. It allows us to keep an eye on the functioning of your device to ensure it is working properly. You are scheduled for a device check from home on 05/05/17. You may send your transmission at any time that day. If you have a wireless device, the transmission will be sent automatically. After your physician reviews your transmission, you will receive a postcard with your next transmission date.  Your physician wants you to follow-up in: 12 months with Dr [place]ney FrameAllred in [place] You will receive a reminder letter in the mail two months in advance. If you don't receive a letter, please call our office to schedule the follow-up appointment.      Any Other Special Instructions Will Be Listed Below (If Applicable).     If you need a refill on your cardiac medications before your next appointment, please call your pharmacy.

## 2017-02-03 NOTE — Progress Notes (Signed)
PCP: Adrian Prince, MD Primary Cardiologist:  [name] Primary EP:  Johney Frame  [name] is a 61 y.o. male who presents today for routine electrophysiology followup.  Since last being seen in our clinic, the patient reports doing very well.  Today, he denies symptoms of palpitations, chest pain, shortness of breath,  lower extremity edema, dizziness, presyncope, syncope, or ICD shocks.  The patient is otherwise without complaint today.   Past Medical History:  Diagnosis Date  . Acute anterior myocardial infarction (HCC) 09/02/2008   post emergent stenting of the LAD in January 2010 -- anterior ST-elevation myocardial infarction  . Adrenal mass (HCC)    currently being evaluated -- 4.1 cm right adrenal mass  . Anemia   . CAD (coronary artery disease)    Est. EF of 45% -- Successful intracoronary stenting of the mid-LAD -- Three-vessel atherosclerotic coronary artery disease.  The patient has occlusion of the mid LAD, which is his culprit lesion.  He has  moderate disease in the proximal circumflex and PDA -- Moderate left ventricular dysfunction -- [name], [name]  . Cardiac arrest - ventricular fibrillation 09/02/2008   Out of hospital cardiac arrest secondary to arrhythmia  . CHF (congestive heart failure) (HCC)   . CHF NYHA class II (HCC)     New York Heart Association class II/III heart failure  . Ejection fraction < 50%    30-35%  . History of methicillin resistant staphylococcus aureus (MRSA)    History of methicillin-sensitive Staphylococcus aureus bacteremia  with implantable cardioverter-defibrillator explant, February 23, 2009  . HL (hearing loss)   . Hyperlipidemia   . Hypertension   . Ischemic cardiomyopathy    severe ischemic cardiomyopathy -- ejection fraction 30-35%  . Pericarditis    history of acute pericarditis in July 2010 now resolved  . Polymorphic ventricular tachycardia (HCC)    with VF arrest, s/p subsequent ICD  . S/P CABG (coronary artery bypass  graft) Sept 2012   Past Surgical History:  Procedure Laterality Date  . ANKLE SURGERY    . CARDIAC CATHETERIZATION  08/26/2008   Est. EF of 45% -- Successful intracoronary stenting of the mid-LAD -- Three-vessel atherosclerotic coronary artery disease.  The patient has occlusion of the mid LAD, which is his culprit lesion.  He has  moderate disease in the proximal circumflex and PDA -- Moderate left ventricular dysfunction -- [name], [name]  . CARDIAC CATHETERIZATION  03/16/2009    EF was approximately 30% -- Atherosclerotic coronary vascular disease, three-vessel --  Moderate-to-severe ischemic cardiomyopathy -- No clear evidence for an acute culprit lesion that would explain the patient's chest pain -- [name], [name].   . CORONARY ARTERY BYPASS GRAFT  05/07/2011   CABG x3:  LIMA to LAD, SVG to OM2, SVG to PDA  . ICD implant  02/23/2009    with subsequent extraction for MSSA bacteremia  . ICD reimplantation  05/09/2009   status post implant of a Medtronic Maximo II DR dual- chamber cardioverter defibrillator by Dr. Hillis Range  . IMPLANTABLE CARDIOVERTER DEFIBRILLATOR REVISION  03/05/13   RV lead fracture.  A new [employer] [id number] lead was placed  . LEAD REVISION Left 03/05/2013   Procedure: LEAD REVISION;  Surgeon: Hillis Range, MD;  Location: [facility] CATH LAB;  Service: Cardiovascular;  Laterality: Left;    ROS- all systems are reviewed and negative except as per HPI above  Current Outpatient Prescriptions  Medication Sig Dispense Refill  . aspirin EC  325 MG tablet Take 325 mg by mouth daily.      Marland Kitchen. atorvastatin (LIPITOR) 80 MG tablet Take 1 tablet (80 mg total) by mouth at bedtime. 90 tablet 3  . carvedilol (COREG) 25 MG tablet TAKE 1 TABLET BY MOUTH TWICE DAILY WITH A MEAL 180 tablet 3  . nitroGLYCERIN (NITROSTAT) 0.4 MG SL tablet Place 1 tablet (0.4 mg total) under the tongue every 5 (five) minutes x 3 doses as needed for chest pain. IF NEEDED 25 tablet 6  . ramipril  (ALTACE) 10 MG capsule Take 1 capsule (10 mg total) by mouth 2 (two) times daily. 180 capsule 3  . spironolactone (ALDACTONE) 25 MG tablet Take 1 tablet (25 mg total) by mouth daily. 90 tablet 3   No current facility-administered medications for this visit.     Physical Exam: Vitals:   02/03/17 1454  BP: 102/78  Pulse: 84  SpO2: 97%  Weight: 159 lb 9.6 oz (72.4 kg)  Height: 5\' 7"  (1.702 m)    GEN- The patient is well appearing, alert and oriented x 3 today.   Head- normocephalic, atraumatic Eyes-  Sclera clear, conjunctiva pink Ears- hearing intact Oropharynx- clear Lungs- Clear to ausculation bilaterally, normal work of breathing Chest- ICD pocket is well healed Heart- Regular rate and rhythm, no murmurs, rubs or gallops, PMI not laterally displaced GI- soft, NT, ND, + BS Extremities- no clubbing, cyanosis, or edema  ICD interrogation- reviewed in detail today,  See PACEART report  Assessment and Plan:  1.  Chronic systolic dysfunction/ ischemic CM/ CAD euvolemic today Stable on an appropriate medical regimen  2. S/p prior VF arrest Normal ICD function See Pace Art report No changes today   carelink Return to see me in [place] office for device check in 1 year Follow-up with Dr SwazilandJordan as scheduled  Hillis RangeJames Markie Frith MD, [facility] 02/03/2017 3:09 PM

## 2017-02-06 NOTE — Progress Notes (Signed)
Cardiology Office Note   Date:  02/07/2017   ID:  [name], DOB 09/06/1955, MRN [id number]  PCP:  Adrian Prince, MD  Cardiologist:   Ellarae Nevitt Sw[place], MD   Chief Complaint  Patient presents with  . Follow-up    No Sx  . Coronary Artery Disease  . Congestive Heart Failure      History of Present Illness: [name] is a 61 y.o. male who presents for follow up CAD. Last seen by me in October 2015. He presented initially in January 2010 with an anterior STEMI. He underwent stenting of the mid LAD with DES. post DC he returned one week later with out of hospital cardiac arrest with Vfib. Repeat angiography showed a 50% in stent restenosis that was treated with balloon angioplasty. He had an ICD placed. This later had to be explanted and replaced after he developed Staph bacteremia and pericarditis. In 2012 he presented with progressive angina and had significant progression of CAD by cath. He underwent  CABG in September 2012 by Dr. [name] with LIMA to the LAD, SVG to OM, and SVG to RCA.  He has a history of chronic systolic CHF with last EF 35% by Echo in 2013. He is followed by Dr. Johney Frame in the device clinic for his ICD.   On follow up today he states he is doing very well. No chest pain, dyspnea, palpitations. No ICD therapies. No edema. Stays very active. Still smoking 1 pack per week.    Past Medical History:  Diagnosis Date  . Acute anterior myocardial infarction (HCC) 09/02/2008   post emergent stenting of the LAD in January 2010 -- anterior ST-elevation myocardial infarction  . Adrenal mass (HCC)    currently being evaluated -- 4.1 cm right adrenal mass  . Anemia   . CAD (coronary artery disease)    Est. EF of 45% -- Successful intracoronary stenting of the mid-LAD -- Three-vessel atherosclerotic coronary artery disease.  The patient has occlusion of the mid LAD, which is his culprit lesion.  He has  moderate disease in the proximal circumflex and PDA -- Moderate  left ventricular dysfunction -- Soliana Kitko M. [place], [name]  . Cardiac arrest - ventricular fibrillation 09/02/2008   Out of hospital cardiac arrest secondary to arrhythmia  . CHF (congestive heart failure) (HCC)   . CHF NYHA class II (HCC)     New York Heart Association class II/III heart failure  . Ejection fraction < 50%    30-35%  . History of methicillin resistant staphylococcus aureus (MRSA)    History of methicillin-sensitive Staphylococcus aureus bacteremia  with implantable cardioverter-defibrillator explant, February 23, 2009  . HL (hearing loss)   . Hyperlipidemia   . Hypertension   . Ischemic cardiomyopathy    severe ischemic cardiomyopathy -- ejection fraction 30-35%  . Pericarditis    history of acute pericarditis in July 2010 now resolved  . Polymorphic ventricular tachycardia (HCC)    with VF arrest, s/p subsequent ICD  . S/P CABG (coronary artery bypass graft) Sept 2012    Past Surgical History:  Procedure Laterality Date  . ANKLE SURGERY    . CARDIAC CATHETERIZATION  08/26/2008   Est. EF of 45% -- Successful intracoronary stenting of the mid-LAD -- Three-vessel atherosclerotic coronary artery disease.  The patient has occlusion of the mid LAD, which is his culprit lesion.  He has  moderate disease in the proximal circumflex and PDA -- Moderate left ventricular dysfunction -- Lang Zingg M. [place], [name]  .  CARDIAC CATHETERIZATION  03/16/2009    EF was approximately 30% -- Atherosclerotic coronary vascular disease, three-vessel --  Moderate-to-severe ischemic cardiomyopathy -- No clear evidence for an acute culprit lesion that would explain the patient's chest pain -- [name], [name].   . CORONARY ARTERY BYPASS GRAFT  05/07/2011   CABG x3:  LIMA to LAD, SVG to OM2, SVG to PDA  . ICD implant  02/23/2009    with subsequent extraction for MSSA bacteremia  . ICD reimplantation  05/09/2009   status post implant of a Medtronic Maximo II DR dual- chamber cardioverter defibrillator by Dr.  Hillis Range  . IMPLANTABLE CARDIOVERTER DEFIBRILLATOR REVISION  03/05/13   RV lead fracture.  A new [employer] [id number] lead was placed  . LEAD REVISION Left 03/05/2013   Procedure: LEAD REVISION;  Surgeon: Hillis Range, MD;  Location: [facility] CATH LAB;  Service: Cardiovascular;  Laterality: Left;     Current Outpatient Prescriptions  Medication Sig Dispense Refill  . atorvastatin (LIPITOR) 80 MG tablet Take 1 tablet (80 mg total) by mouth at bedtime. 90 tablet 3  . carvedilol (COREG) 25 MG tablet TAKE 1 TABLET BY MOUTH TWICE DAILY WITH A MEAL 180 tablet 3  . nitroGLYCERIN (NITROSTAT) 0.4 MG SL tablet Place 1 tablet (0.4 mg total) under the tongue every 5 (five) minutes x 3 doses as needed for chest pain. IF NEEDED 25 tablet 6  . ramipril (ALTACE) 10 MG capsule Take 1 capsule (10 mg total) by mouth 2 (two) times daily. 180 capsule 3  . spironolactone (ALDACTONE) 25 MG tablet Take 1 tablet (25 mg total) by mouth daily. 90 tablet 3  . aspirin EC 81 MG tablet Take 1 tablet (81 mg total) by mouth daily. 90 tablet 3   No current facility-administered medications for this visit.     Allergies:   Morphine and related    Social History:  The patient  reports that he has been smoking Cigarettes.  He has smoked for the past 37.00 years. He has never used smokeless tobacco. He reports that he does not drink alcohol or use drugs.   Family History:  The patient's family history includes Coronary artery disease in his father; Stroke in his mother.    ROS:  Please see the history of present illness.   Otherwise, review of systems are positive for none.   All other systems are reviewed and negative.    PHYSICAL EXAM: VS:  BP 112/78   Pulse 84   Ht 5\' 6"  (1.676 m)   Wt 159 lb 3.2 oz (72.2 kg)   BMI 25.70 kg/m  , BMI Body mass index is 25.7 kg/m. GEN: Well nourished, well developed, in no acute distress  HEENT: normal  Neck: no JVD, carotid bruits, or masses Cardiac: RRR; normal S1-2.no murmurs,  rubs, or gallops,no edema  ICD site is stable in right upper chest. Respiratory:  clear to auscultation bilaterally, normal work of breathing GI: soft, nontender, nondistended, + BS MS: no deformity or atrophy  Skin: warm and dry, no rash Neuro:  Strength and sensation are intact Psych: euthymic mood, full affect   EKG:  EKG is ordered today. The ekg ordered today demonstrates atrial paced rhythm rate 84. Old anterior infarct. I have personally reviewed and interpreted this study.    Recent Labs: No results found for requested labs within last 8760 hours.    Lipid Panel    Component Value Date/Time   CHOL 112 (L) 03/11/2016 0818  TRIG 81 03/11/2016 0818   HDL 24 (L) 03/11/2016 0818   CHOLHDL 4.7 03/11/2016 0818   VLDL 16 03/11/2016 0818   LDLCALC 72 03/11/2016 0818   LDLDIRECT 217.3 04/04/2011 1124      Wt Readings from Last 3 Encounters:  02/07/17 159 lb 3.2 oz (72.2 kg)  02/03/17 159 lb 9.6 oz (72.4 kg)  01/31/16 173 lb 3.2 oz (78.6 kg)      Other studies Reviewed: Additional studies/ records that were reviewed today include: none   ASSESSMENT AND PLAN:  1.  CAD with remote anterior MI complicated by late Vfib. S/p stent of LAD in 2010. Subsequent CABG in 2012 with progressive disease. He is asymptomatic. Continue ASA, statin, Coreg. Will reduce ASA to 81 mg daily. Will arrange follow up stress Myoview since he is 6 years out from CABG.  2. Chronic systolic CHF. Well compensated. On Coreg, Altace and aldactone. Follow up lab work today. Arrange follow up Echo.  3. Hypercholesterolemia. On high dose lipitor. Check fasting lab today.  4. Tobacco use. Stressed importance on complete cessation.   Current medicines are reviewed at length with the patient today.  The patient does not have concerns regarding medicines.  The following changes have been made:  Reduce ASA to 81 mg daily  Labs/ tests ordered today include: CBC, CMET, lipid panel Echo Stress Myoview.    Orders Placed This Encounter  Procedures  . EKG 12-Lead     Disposition:   FU with me in 1 year  Signed, Dajia Gunnels SwazilandJordan, MD  02/07/2017 8:29 AM    [facility] [address], [place], [place], [address] Phone [phone], Fax [phone]

## 2017-02-07 ENCOUNTER — Encounter: Payer: Self-pay | Admitting: Cardiology

## 2017-02-07 ENCOUNTER — Ambulatory Visit (INDEPENDENT_AMBULATORY_CARE_PROVIDER_SITE_OTHER): Payer: Medicare Other | Admitting: Cardiology

## 2017-02-07 VITALS — BP 112/78 | HR 84 | Ht 66.0 in | Wt 159.2 lb

## 2017-02-07 DIAGNOSIS — I4901 Ventricular fibrillation: Secondary | ICD-10-CM | POA: Diagnosis not present

## 2017-02-07 DIAGNOSIS — Z95810 Presence of automatic (implantable) cardiac defibrillator: Secondary | ICD-10-CM

## 2017-02-07 DIAGNOSIS — I2589 Other forms of chronic ischemic heart disease: Secondary | ICD-10-CM | POA: Diagnosis not present

## 2017-02-07 DIAGNOSIS — I5022 Chronic systolic (congestive) heart failure: Secondary | ICD-10-CM | POA: Diagnosis not present

## 2017-02-07 DIAGNOSIS — E7800 Pure hypercholesterolemia, unspecified: Secondary | ICD-10-CM | POA: Diagnosis not present

## 2017-02-07 DIAGNOSIS — F17200 Nicotine dependence, unspecified, uncomplicated: Secondary | ICD-10-CM

## 2017-02-07 DIAGNOSIS — I255 Ischemic cardiomyopathy: Secondary | ICD-10-CM

## 2017-02-07 LAB — CUP PACEART INCLINIC DEVICE CHECK
Battery Remaining Longevity: 71 mo
Brady Statistic AP VS Percent: 93.82 %
Brady Statistic AS VP Percent: 0 %
Brady Statistic AS VS Percent: 6.18 %
Brady Statistic RV Percent Paced: 0.01 %
HighPow Impedance: 69 Ohm
Implantable Lead Implant Date: 20140711
Implantable Lead Model: 181
Implantable Lead Model: 5076
Lead Channel Impedance Value: 513 Ohm
Lead Channel Pacing Threshold Amplitude: 1.125 V
Lead Channel Pacing Threshold Amplitude: 1.875 V
Lead Channel Pacing Threshold Pulse Width: 0.4 ms
Lead Channel Sensing Intrinsic Amplitude: 1.375 mV
Lead Channel Sensing Intrinsic Amplitude: 1.75 mV
Lead Channel Sensing Intrinsic Amplitude: 6.125 mV
Lead Channel Setting Pacing Amplitude: 2.5 V
Lead Channel Setting Pacing Pulse Width: 0.4 ms
Lead Channel Setting Sensing Sensitivity: 0.3 mV
MDC IDC LEAD IMPLANT DT: 20100414
MDC IDC LEAD LOCATION: 753859
MDC IDC LEAD LOCATION: 753860
MDC IDC LEAD SERIAL: 323897
MDC IDC MSMT BATTERY VOLTAGE: 2.98 V
MDC IDC MSMT LEADCHNL RA PACING THRESHOLD PULSEWIDTH: 0.4 ms
MDC IDC MSMT LEADCHNL RV IMPEDANCE VALUE: 323 Ohm
MDC IDC MSMT LEADCHNL RV IMPEDANCE VALUE: 323 Ohm
MDC IDC MSMT LEADCHNL RV SENSING INTR AMPL: 6.5 mV
MDC IDC PG IMPLANT DT: 20140711
MDC IDC SESS DTM: 20180611191607
MDC IDC SET LEADCHNL RA PACING AMPLITUDE: 2.25 V
MDC IDC STAT BRADY AP VP PERCENT: 0.01 %
MDC IDC STAT BRADY RA PERCENT PACED: 93.74 %

## 2017-02-07 MED ORDER — ASPIRIN EC 81 MG PO TBEC: 81.0000 mg | DELAYED_RELEASE_TABLET | Freq: Every day | ORAL | 3 refills | Status: AC

## 2017-02-07 NOTE — Patient Instructions (Addendum)
Reduce ASA to 81 mg daily  We will check blood work today  We will schedule you for an Echocardiogram and a Nuclear stress test- Hold Coreg the night before and morning of the stress test

## 2017-02-08 LAB — BASIC METABOLIC PANEL
BUN/Creatinine Ratio: 13 (ref 9–20)
BUN: 11 mg/dL (ref 6–24)
CALCIUM: 9.2 mg/dL (ref 8.7–10.2)
CO2: 21 mmol/L (ref 20–29)
CREATININE: 0.86 mg/dL (ref 0.76–1.27)
Chloride: 101 mmol/L (ref 96–106)
GFR calc Af Amer: 110 mL/min/{1.73_m2} (ref >59)
GFR calc non Af Amer: 95 mL/min/{1.73_m2} (ref >59)
Glucose: 87 mg/dL (ref 65–99)
POTASSIUM: 4.7 mmol/L (ref 3.5–5.2)
SODIUM: 138 mmol/L (ref 134–144)

## 2017-02-08 LAB — HEPATIC FUNCTION PANEL
ALBUMIN: 4.4 g/dL (ref 3.5–5.5)
ALK PHOS: 128 IU/L — AB (ref 39–117)
ALT: 13 IU/L (ref 0–44)
AST: 18 IU/L (ref 0–40)
BILIRUBIN TOTAL: 0.3 mg/dL (ref 0.0–1.2)
Bilirubin, Direct: 0.1 mg/dL (ref 0.00–0.40)
Total Protein: 6.7 g/dL (ref 6.0–8.5)

## 2017-02-08 LAB — CBC WITH DIFFERENTIAL/PLATELET
BASOS: 0 %
Basophils Absolute: 0 10*3/uL (ref 0.0–0.2)
EOS (ABSOLUTE): 0.3 10*3/uL (ref 0.0–0.4)
Eos: 3 %
Hematocrit: 42.3 % (ref 37.5–51.0)
Hemoglobin: 14.5 g/dL (ref 13.0–17.7)
Immature Grans (Abs): 0 10*3/uL (ref 0.0–0.1)
Immature Granulocytes: 0 %
LYMPHS ABS: 2.8 10*3/uL (ref 0.7–3.1)
Lymphs: 34 %
MCH: 33 pg (ref 26.6–33.0)
MCHC: 34.3 g/dL (ref 31.5–35.7)
MCV: 96 fL (ref 79–97)
Monocytes Absolute: 0.9 10*3/uL (ref 0.1–0.9)
Monocytes: 10 %
NEUTROS ABS: 4.4 10*3/uL (ref 1.4–7.0)
Neutrophils: 53 %
PLATELETS: 260 10*3/uL (ref 150–379)
RBC: 4.39 x10E6/uL (ref 4.14–5.80)
RDW: 13.6 % (ref 12.3–15.4)
WBC: 8.4 10*3/uL (ref 3.4–10.8)

## 2017-02-08 LAB — LIPID PANEL
Chol/HDL Ratio: 4.4 ratio (ref 0.0–5.0)
Cholesterol, Total: 120 mg/dL (ref 100–199)
HDL: 27 mg/dL — AB (ref >39)
LDL Calculated: 73 mg/dL (ref 0–99)
TRIGLYCERIDES: 100 mg/dL (ref 0–149)
VLDL Cholesterol Cal: 20 mg/dL (ref 5–40)

## 2017-02-12 ENCOUNTER — Telehealth (HOSPITAL_COMMUNITY): Payer: Self-pay

## 2017-02-12 NOTE — Telephone Encounter (Signed)
Encounter complete. 

## 2017-02-19 ENCOUNTER — Ambulatory Visit (HOSPITAL_COMMUNITY)
Admission: RE | Admit: 2017-02-19 | Discharge: 2017-02-19 | Disposition: A | Discharge status: Home / Self Care | Payer: Medicare Other | Source: Ambulatory Visit | Attending: Internal Medicine | Admitting: Internal Medicine

## 2017-02-19 DIAGNOSIS — I255 Ischemic cardiomyopathy: Secondary | ICD-10-CM | POA: Insufficient documentation

## 2017-02-19 DIAGNOSIS — Z951 Presence of aortocoronary bypass graft: Secondary | ICD-10-CM | POA: Insufficient documentation

## 2017-02-19 DIAGNOSIS — E7800 Pure hypercholesterolemia, unspecified: Secondary | ICD-10-CM | POA: Diagnosis not present

## 2017-02-19 DIAGNOSIS — F17200 Nicotine dependence, unspecified, uncomplicated: Secondary | ICD-10-CM | POA: Insufficient documentation

## 2017-02-19 DIAGNOSIS — Z8249 Family history of ischemic heart disease and other diseases of the circulatory system: Secondary | ICD-10-CM | POA: Diagnosis not present

## 2017-02-19 DIAGNOSIS — Z95810 Presence of automatic (implantable) cardiac defibrillator: Secondary | ICD-10-CM | POA: Diagnosis not present

## 2017-02-19 DIAGNOSIS — I2510 Atherosclerotic heart disease of native coronary artery without angina pectoris: Secondary | ICD-10-CM | POA: Diagnosis not present

## 2017-02-19 DIAGNOSIS — Z955 Presence of coronary angioplasty implant and graft: Secondary | ICD-10-CM | POA: Insufficient documentation

## 2017-02-19 DIAGNOSIS — I5022 Chronic systolic (congestive) heart failure: Secondary | ICD-10-CM | POA: Diagnosis not present

## 2017-02-19 DIAGNOSIS — I252 Old myocardial infarction: Secondary | ICD-10-CM | POA: Diagnosis not present

## 2017-02-19 DIAGNOSIS — R9439 Abnormal result of other cardiovascular function study: Secondary | ICD-10-CM | POA: Insufficient documentation

## 2017-02-19 DIAGNOSIS — I110 Hypertensive heart disease with heart failure: Secondary | ICD-10-CM | POA: Diagnosis not present

## 2017-02-19 DIAGNOSIS — I4901 Ventricular fibrillation: Secondary | ICD-10-CM | POA: Diagnosis not present

## 2017-02-19 LAB — MYOCARDIAL PERFUSION IMAGING
CHL CUP RESTING HR STRESS: 66 {beats}/min
LV sys vol: 105 mL
LVDIAVOL: 160 mL (ref 62–150)
Peak HR: 114 {beats}/min
SDS: 0
SRS: 12
SSS: 12
TID: 1.09

## 2017-02-19 MED ORDER — REGADENOSON 0.4 MG/5ML IV SOLN
0.4000 mg | Freq: Once | INTRAVENOUS | Status: AC
  Administered 2017-02-19: 0.4 mg via INTRAVENOUS

## 2017-02-19 MED ORDER — TECHNETIUM TC 99M TETROFOSMIN IV KIT
31.4000 | PACK | Freq: Once | INTRAVENOUS | Status: AC | PRN
  Administered 2017-02-19: 31.4 via INTRAVENOUS
  Filled 2017-02-19: qty 32

## 2017-02-19 MED ORDER — TECHNETIUM TC 99M TETROFOSMIN IV KIT
10.7000 | PACK | Freq: Once | INTRAVENOUS | Status: AC | PRN
  Administered 2017-02-19: 10.7 via INTRAVENOUS
  Filled 2017-02-19: qty 11

## 2017-02-20 ENCOUNTER — Ambulatory Visit (INDEPENDENT_AMBULATORY_CARE_PROVIDER_SITE_OTHER): Payer: Medicare Other | Admitting: *Deleted

## 2017-02-20 DIAGNOSIS — I472 Ventricular tachycardia: Secondary | ICD-10-CM | POA: Diagnosis not present

## 2017-02-20 DIAGNOSIS — I4729 Other ventricular tachycardia: Secondary | ICD-10-CM

## 2017-02-20 NOTE — Progress Notes (Signed)
Remote ICD transmission.   

## 2017-02-21 ENCOUNTER — Encounter: Payer: Self-pay | Admitting: Cardiology

## 2017-02-28 ENCOUNTER — Ambulatory Visit (HOSPITAL_COMMUNITY): Payer: Medicare Other | Attending: Cardiovascular Disease

## 2017-02-28 ENCOUNTER — Other Ambulatory Visit: Payer: Self-pay

## 2017-02-28 DIAGNOSIS — I255 Ischemic cardiomyopathy: Secondary | ICD-10-CM

## 2017-02-28 DIAGNOSIS — I5022 Chronic systolic (congestive) heart failure: Secondary | ICD-10-CM | POA: Diagnosis not present

## 2017-02-28 DIAGNOSIS — I069 Rheumatic aortic valve disease, unspecified: Secondary | ICD-10-CM | POA: Insufficient documentation

## 2017-02-28 DIAGNOSIS — F17200 Nicotine dependence, unspecified, uncomplicated: Secondary | ICD-10-CM

## 2017-02-28 DIAGNOSIS — I4901 Ventricular fibrillation: Secondary | ICD-10-CM | POA: Diagnosis not present

## 2017-02-28 DIAGNOSIS — E7800 Pure hypercholesterolemia, unspecified: Secondary | ICD-10-CM

## 2017-02-28 DIAGNOSIS — Z95810 Presence of automatic (implantable) cardiac defibrillator: Secondary | ICD-10-CM

## 2017-02-28 DIAGNOSIS — I5030 Unspecified diastolic (congestive) heart failure: Secondary | ICD-10-CM | POA: Diagnosis not present

## 2017-03-03 ENCOUNTER — Telehealth: Payer: Self-pay | Admitting: Cardiology

## 2017-03-03 NOTE — Telephone Encounter (Signed)
Returned call to patient no answer.LMTC. 

## 2017-03-03 NOTE — Telephone Encounter (Signed)
Patient returning your call.

## 2017-03-03 NOTE — Telephone Encounter (Signed)
New message ° ° ° °Pt is calling about his echo results. °

## 2017-03-03 NOTE — Telephone Encounter (Signed)
Received call from patient echo results given. 

## 2017-03-06 ENCOUNTER — Ambulatory Visit (INDEPENDENT_AMBULATORY_CARE_PROVIDER_SITE_OTHER): Payer: Medicare Other

## 2017-03-06 DIAGNOSIS — I5022 Chronic systolic (congestive) heart failure: Secondary | ICD-10-CM

## 2017-03-06 DIAGNOSIS — Z95810 Presence of automatic (implantable) cardiac defibrillator: Secondary | ICD-10-CM | POA: Diagnosis not present

## 2017-03-07 ENCOUNTER — Encounter: Payer: Self-pay | Admitting: Cardiology

## 2017-03-07 ENCOUNTER — Telehealth: Payer: Self-pay

## 2017-03-07 NOTE — Progress Notes (Signed)
EPIC Encounter for ICM Monitoring  Patient Name: [name] is a 61 y.o. [name] Date: 03/07/2017 Primary Care Physican: Adrian PrinceSouth, Stephen, MD Primary Cardiologist:Jordan Electrophysiologist: Allred Dry Weight:unknown               Attempted call to patient and unable to reach.   Transmission reviewed.    Thoracic impedance normal   No diuretic.    Recommendations: NONE - Unable to reach patient   Follow-up plan: ICM clinic phone appointment on 04/07/2017.  Office appointment scheduled   Copy of ICM check sent to device physician.   3 month ICM trend: 03/07/2017   1 Year ICM trend:      Karie SodaLaurie S Neythan Kozlov, RN 03/07/2017 10:54 AM

## 2017-03-07 NOTE — Telephone Encounter (Signed)
Remote ICM transmission received.  Attempted patient call and no answer 

## 2017-03-13 ENCOUNTER — Telehealth: Payer: Self-pay | Admitting: Cardiology

## 2017-03-13 NOTE — Telephone Encounter (Signed)
Spoke to patient myoview results given. 

## 2017-03-13 NOTE — Telephone Encounter (Signed)
New message    Pt is returning call to Speedheryl about results. Please call.

## 2017-03-18 LAB — CUP PACEART REMOTE DEVICE CHECK
Battery Remaining Longevity: 71 mo
Battery Voltage: 2.99 V
Brady Statistic AS VP Percent: 0 %
Brady Statistic RA Percent Paced: 95.53 %
Brady Statistic RV Percent Paced: 0 %
HighPow Impedance: 64 Ohm
Implantable Lead Implant Date: 20100414
Implantable Lead Implant Date: 20140711
Implantable Lead Location: 753859
Implantable Lead Model: 181
Implantable Lead Model: 5076
Lead Channel Impedance Value: 304 Ohm
Lead Channel Impedance Value: 304 Ohm
Lead Channel Pacing Threshold Amplitude: 1 V
Lead Channel Pacing Threshold Pulse Width: 0.4 ms
Lead Channel Sensing Intrinsic Amplitude: 1.5 mV
Lead Channel Sensing Intrinsic Amplitude: 1.5 mV
Lead Channel Setting Pacing Pulse Width: 0.4 ms
Lead Channel Setting Sensing Sensitivity: 0.3 mV
MDC IDC LEAD LOCATION: 753860
MDC IDC LEAD SERIAL: 323897
MDC IDC MSMT LEADCHNL RA IMPEDANCE VALUE: 494 Ohm
MDC IDC MSMT LEADCHNL RV PACING THRESHOLD AMPLITUDE: 1.875 V
MDC IDC MSMT LEADCHNL RV PACING THRESHOLD PULSEWIDTH: 0.4 ms
MDC IDC MSMT LEADCHNL RV SENSING INTR AMPL: 5 mV
MDC IDC MSMT LEADCHNL RV SENSING INTR AMPL: 5 mV
MDC IDC PG IMPLANT DT: 20140711
MDC IDC SESS DTM: 20180628041708
MDC IDC SET LEADCHNL RA PACING AMPLITUDE: 2 V
MDC IDC SET LEADCHNL RV PACING AMPLITUDE: 2.5 V
MDC IDC STAT BRADY AP VP PERCENT: 0 %
MDC IDC STAT BRADY AP VS PERCENT: 95.61 %
MDC IDC STAT BRADY AS VS PERCENT: 4.39 %

## 2017-04-07 ENCOUNTER — Ambulatory Visit (INDEPENDENT_AMBULATORY_CARE_PROVIDER_SITE_OTHER): Payer: Medicare Other

## 2017-04-07 DIAGNOSIS — I5022 Chronic systolic (congestive) heart failure: Secondary | ICD-10-CM | POA: Diagnosis not present

## 2017-04-07 DIAGNOSIS — Z95810 Presence of automatic (implantable) cardiac defibrillator: Secondary | ICD-10-CM

## 2017-04-07 NOTE — Progress Notes (Addendum)
EPIC Encounter for ICM Monitoring  Patient Name: [name] is a 61 y.o. male Date: 04/07/2017 Primary Care Physican: Adrian PrinceSouth, Stephen, MD Primary Cardiologist:Jordan Electrophysiologist: Allred Dry Weight:unknown                                               Attempted call to patient and unable to reach.   Transmission reviewed.    Thoracic impedance normal   No diuretic.    Recommendations: NONE - Unable to reach patient   Follow-up plan: ICM clinic phone appointment on 05/08/2017.    Copy of ICM check sent to device physician.   3 month ICM trend: 04/07/2017   1 Year ICM trend:      Karie SodaLaurie S Short, RN 04/07/2017 10:02 AM

## 2017-04-14 ENCOUNTER — Other Ambulatory Visit: Payer: Self-pay | Admitting: Nurse Practitioner

## 2017-04-14 ENCOUNTER — Other Ambulatory Visit: Payer: Self-pay | Admitting: *Deleted

## 2017-04-14 DIAGNOSIS — I5022 Chronic systolic (congestive) heart failure: Secondary | ICD-10-CM

## 2017-04-14 DIAGNOSIS — I4901 Ventricular fibrillation: Secondary | ICD-10-CM

## 2017-04-14 DIAGNOSIS — I255 Ischemic cardiomyopathy: Secondary | ICD-10-CM

## 2017-04-14 MED ORDER — SPIRONOLACTONE 25 MG PO TABS: 25.0000 mg | ORAL_TABLET | Freq: Every day | ORAL | 3 refills | Status: DC

## 2017-04-30 ENCOUNTER — Other Ambulatory Visit: Payer: Self-pay | Admitting: Nurse Practitioner

## 2017-04-30 DIAGNOSIS — I255 Ischemic cardiomyopathy: Secondary | ICD-10-CM

## 2017-04-30 DIAGNOSIS — I4901 Ventricular fibrillation: Secondary | ICD-10-CM

## 2017-04-30 DIAGNOSIS — I5022 Chronic systolic (congestive) heart failure: Secondary | ICD-10-CM

## 2017-05-02 NOTE — Telephone Encounter (Signed)
Rx has been sent to the pharmacy electronically. ° °

## 2017-05-08 ENCOUNTER — Telehealth: Payer: Self-pay

## 2017-05-08 ENCOUNTER — Ambulatory Visit (INDEPENDENT_AMBULATORY_CARE_PROVIDER_SITE_OTHER): Payer: Medicare Other

## 2017-05-08 DIAGNOSIS — I5022 Chronic systolic (congestive) heart failure: Secondary | ICD-10-CM

## 2017-05-08 DIAGNOSIS — Z95810 Presence of automatic (implantable) cardiac defibrillator: Secondary | ICD-10-CM | POA: Diagnosis not present

## 2017-05-08 NOTE — Telephone Encounter (Signed)
Spoke with wife and requested pt send remote transmission that is due today. She verbalized understanding. 

## 2017-05-08 NOTE — Progress Notes (Signed)
EPIC Encounter for ICM Monitoring  Patient Name: Suezanne JacquetRobert E Petter is a 61 y.o. male Date: 05/08/2017 Primary Care Physican: Adrian PrinceSouth, Stephen, MD Primary Cardiologist:Jordan Electrophysiologist: Allred Dry Weight:unknown      Spoke with wife.  She stated patient is feeling fine and denied any fluid symptoms.     Thoracic impedance normal   Recommendations: No changes and encouraged to call for fluid symptoms.   Follow-up plan: ICM clinic phone appointment on 06/10/2017.    Copy of ICM check sent to Dr. Johney FrameAllred   3 month ICM trend: 05/08/2017   1 Year ICM trend:      Karie SodaLaurie S Holland Nickson, RN 05/08/2017 3:25 PM

## 2017-06-10 ENCOUNTER — Ambulatory Visit (INDEPENDENT_AMBULATORY_CARE_PROVIDER_SITE_OTHER): Payer: Medicare Other | Admitting: *Deleted

## 2017-06-10 ENCOUNTER — Telehealth: Payer: Self-pay

## 2017-06-10 DIAGNOSIS — I472 Ventricular tachycardia: Secondary | ICD-10-CM

## 2017-06-10 DIAGNOSIS — Z95810 Presence of automatic (implantable) cardiac defibrillator: Secondary | ICD-10-CM

## 2017-06-10 DIAGNOSIS — I5022 Chronic systolic (congestive) heart failure: Secondary | ICD-10-CM

## 2017-06-10 DIAGNOSIS — I4729 Other ventricular tachycardia: Secondary | ICD-10-CM

## 2017-06-10 NOTE — Progress Notes (Signed)
EPIC Encounter for ICM Monitoring  Patient Name: [name] is a 61 y.o. male Date: 06/10/2017 Primary Care Physican: Adrian Prince, MD Primary Cardiologist:Jordan Electrophysiologist: Allred Dry Weight:unknown      Attempted call to patient and unable to reach.  Left detailed message regarding transmission.  Transmission reviewed.    Thoracic impedance normal.  No diuretic.   Recommendations: Left voice mail with ICM number and encouraged to call if experiencing any fluid symptoms.  Follow-up plan: ICM clinic phone appointment on 07/11/2017.  Copy of ICM check sent to Dr. Johney Frame.   3 month ICM trend: 06/10/2017  1 Year ICM trend:      Karie Soda, RN 06/10/2017 5:02 PM

## 2017-06-10 NOTE — Telephone Encounter (Signed)
Remote ICM transmission received.  Attempted call to patient and left detailed message per DPR regarding transmission and next ICM scheduled for 07/11/2017.  Advised to return call for any fluid symptoms or questions.    

## 2017-06-12 LAB — CUP PACEART REMOTE DEVICE CHECK
Battery Remaining Longevity: 65 mo
Battery Voltage: 2.98 V
Brady Statistic RA Percent Paced: 94.34 %
Brady Statistic RV Percent Paced: 0 %
Date Time Interrogation Session: 20181016041607
HIGH POWER IMPEDANCE MEASURED VALUE: 65 Ohm
Implantable Lead Implant Date: 20100414
Implantable Lead Location: 753860
Implantable Lead Model: 181
Implantable Lead Serial Number: 323897
Implantable Pulse Generator Implant Date: 20140711
Lead Channel Impedance Value: 323 Ohm
Lead Channel Pacing Threshold Amplitude: 1.125 V
Lead Channel Pacing Threshold Pulse Width: 0.4 ms
Lead Channel Sensing Intrinsic Amplitude: 6.125 mV
Lead Channel Setting Pacing Amplitude: 2.25 V
Lead Channel Setting Sensing Sensitivity: 0.3 mV
MDC IDC LEAD IMPLANT DT: 20140711
MDC IDC LEAD LOCATION: 753859
MDC IDC MSMT LEADCHNL RA IMPEDANCE VALUE: 494 Ohm
MDC IDC MSMT LEADCHNL RA SENSING INTR AMPL: 1.625 mV
MDC IDC MSMT LEADCHNL RA SENSING INTR AMPL: 1.625 mV
MDC IDC MSMT LEADCHNL RV IMPEDANCE VALUE: 323 Ohm
MDC IDC MSMT LEADCHNL RV PACING THRESHOLD AMPLITUDE: 1.875 V
MDC IDC MSMT LEADCHNL RV PACING THRESHOLD PULSEWIDTH: 0.4 ms
MDC IDC MSMT LEADCHNL RV SENSING INTR AMPL: 6.125 mV
MDC IDC SET LEADCHNL RV PACING AMPLITUDE: 2.5 V
MDC IDC SET LEADCHNL RV PACING PULSEWIDTH: 0.4 ms
MDC IDC STAT BRADY AP VP PERCENT: 0 %
MDC IDC STAT BRADY AP VS PERCENT: 94.42 %
MDC IDC STAT BRADY AS VP PERCENT: 0 %
MDC IDC STAT BRADY AS VS PERCENT: 5.58 %

## 2017-06-12 NOTE — Progress Notes (Signed)
Remote ICD transmission.   

## 2017-06-13 ENCOUNTER — Encounter: Payer: Self-pay | Admitting: Cardiology

## 2017-07-11 ENCOUNTER — Ambulatory Visit (INDEPENDENT_AMBULATORY_CARE_PROVIDER_SITE_OTHER): Payer: Medicare Other

## 2017-07-11 ENCOUNTER — Telehealth: Payer: Self-pay

## 2017-07-11 DIAGNOSIS — I5022 Chronic systolic (congestive) heart failure: Secondary | ICD-10-CM | POA: Diagnosis not present

## 2017-07-11 DIAGNOSIS — Z95810 Presence of automatic (implantable) cardiac defibrillator: Secondary | ICD-10-CM | POA: Diagnosis not present

## 2017-07-11 NOTE — Progress Notes (Signed)
EPIC Encounter for ICM Monitoring  Patient Name: Billy JacquetRobert E Byrum is a 61 y.o. male Date: 07/11/2017 Primary Care Physican: Adrian PrinceSouth, Stephen, MD Primary Cardiologist:Jordan Electrophysiologist: Allred Dry Weight:unknown      Attempted call to patient and unable to reach.  Left detailed message regarding transmission.  Transmission reviewed.    Thoracic impedance normal.  No diuretic.   Recommendations:  Left voice mail with ICM number and encouraged to call if experiencing any fluid symptoms.  Follow-up plan: ICM clinic phone appointment on 08/11/2017.   Copy of ICM check sent to Dr. Johney FrameAllred.   3 month ICM trend: 07/11/2017    1 Year ICM trend:       Karie SodaLaurie S Norma Ignasiak, RN 07/11/2017 7:44 AM

## 2017-07-11 NOTE — Telephone Encounter (Signed)
Remote ICM transmission received.  Attempted call to patient and left detailed message per DPR regarding transmission and next ICM scheduled for 08/11/2017.  Advised to return call for any fluid symptoms or questions.    

## 2017-08-11 ENCOUNTER — Ambulatory Visit (INDEPENDENT_AMBULATORY_CARE_PROVIDER_SITE_OTHER): Payer: Medicare Other

## 2017-08-11 DIAGNOSIS — Z95810 Presence of automatic (implantable) cardiac defibrillator: Secondary | ICD-10-CM | POA: Diagnosis not present

## 2017-08-11 DIAGNOSIS — I5022 Chronic systolic (congestive) heart failure: Secondary | ICD-10-CM

## 2017-08-14 NOTE — Progress Notes (Signed)
EPIC Encounter for ICM Monitoring  Patient Name: Billy JacquetRobert E Caradine is a 61 y.o. male Date: 08/14/2017 Primary Care Physican: Adrian PrinceSouth, Stephen, MD Primary Cardiologist:Jordan Electrophysiologist: Allred Dry Weight:unknown                                                    Transmission received.   Thoracic impedance normal.  No diuretic.   Follow-up plan: ICM clinic phone appointment on 09/11/2017.    Copy of ICM check sent to Dr. Johney FrameAllred.   3 month ICM trend: 08/11/2017    1 Year ICM trend:       Karie SodaLaurie S Abdul Beirne, RN 08/14/2017 4:40 PM

## 2017-09-11 ENCOUNTER — Ambulatory Visit (INDEPENDENT_AMBULATORY_CARE_PROVIDER_SITE_OTHER): Payer: Medicare Other | Admitting: *Deleted

## 2017-09-11 DIAGNOSIS — I4729 Other ventricular tachycardia: Secondary | ICD-10-CM

## 2017-09-11 DIAGNOSIS — Z95810 Presence of automatic (implantable) cardiac defibrillator: Secondary | ICD-10-CM | POA: Diagnosis not present

## 2017-09-11 DIAGNOSIS — I472 Ventricular tachycardia: Secondary | ICD-10-CM

## 2017-09-11 DIAGNOSIS — I5022 Chronic systolic (congestive) heart failure: Secondary | ICD-10-CM | POA: Diagnosis not present

## 2017-09-11 NOTE — Progress Notes (Signed)
Remote ICD transmission.   

## 2017-09-12 ENCOUNTER — Encounter: Payer: Self-pay | Admitting: Cardiology

## 2017-09-12 NOTE — Progress Notes (Signed)
EPIC Encounter for ICM Monitoring  Patient Name: [name] is a 62 y.o. male Date: 09/12/2017 Primary Care Physican: Adrian PrinceSouth, Stephen, MD Primary Cardiologist:Jordan Electrophysiologist: Allred Dry Weight:unknown       Spoke with wife due to patient was not home.  Heart Failure questions reviewed, pt asymptomatic.   Thoracic impedance just slightly below baseline normal.  No diuretic.   Recommendations: Advised to limit salt intake.  Encouraged to call for fluid symptoms.  Follow-up plan: ICM clinic phone appointment on 10/14/2017.    Copy of ICM check sent to Dr. Johney FrameAllred.   3 month ICM trend: 09/11/2017    1 Year ICM trend:       Billy SodaLaurie S Dianey Suchy, RN 09/12/2017 1:50 PM

## 2017-09-25 LAB — CUP PACEART REMOTE DEVICE CHECK
Battery Voltage: 2.98 V
Brady Statistic AP VP Percent: 0.02 %
Brady Statistic AP VS Percent: 87.04 %
Brady Statistic AS VS Percent: 12.94 %
Date Time Interrogation Session: 20190117093827
HIGH POWER IMPEDANCE MEASURED VALUE: 61 Ohm
Implantable Lead Location: 753860
Implantable Lead Serial Number: 323897
Implantable Pulse Generator Implant Date: 20140711
Lead Channel Impedance Value: 494 Ohm
Lead Channel Pacing Threshold Amplitude: 1.875 V
Lead Channel Pacing Threshold Pulse Width: 0.4 ms
Lead Channel Sensing Intrinsic Amplitude: 6.375 mV
Lead Channel Setting Pacing Amplitude: 2.25 V
Lead Channel Setting Pacing Amplitude: 2.5 V
Lead Channel Setting Pacing Pulse Width: 0.4 ms
MDC IDC LEAD IMPLANT DT: 20100414
MDC IDC LEAD IMPLANT DT: 20140711
MDC IDC LEAD LOCATION: 753859
MDC IDC MSMT BATTERY REMAINING LONGEVITY: 58 mo
MDC IDC MSMT LEADCHNL RA PACING THRESHOLD AMPLITUDE: 1.125 V
MDC IDC MSMT LEADCHNL RA PACING THRESHOLD PULSEWIDTH: 0.4 ms
MDC IDC MSMT LEADCHNL RA SENSING INTR AMPL: 1.625 mV
MDC IDC MSMT LEADCHNL RA SENSING INTR AMPL: 1.625 mV
MDC IDC MSMT LEADCHNL RV IMPEDANCE VALUE: 304 Ohm
MDC IDC MSMT LEADCHNL RV IMPEDANCE VALUE: 323 Ohm
MDC IDC MSMT LEADCHNL RV SENSING INTR AMPL: 6.375 mV
MDC IDC SET LEADCHNL RV SENSING SENSITIVITY: 0.3 mV
MDC IDC STAT BRADY AS VP PERCENT: 0 %
MDC IDC STAT BRADY RA PERCENT PACED: 86.97 %
MDC IDC STAT BRADY RV PERCENT PACED: 0.02 %

## 2017-10-14 ENCOUNTER — Inpatient Hospital Stay (HOSPITAL_COMMUNITY)
Admission: EM | Admit: 2017-10-14 | Discharge: 2017-10-17 | DRG: 247 | Disposition: A | Discharge status: Home / Self Care | Payer: Medicare Other | Attending: Cardiology | Admitting: Cardiology

## 2017-10-14 ENCOUNTER — Emergency Department (HOSPITAL_COMMUNITY): Payer: Medicare Other

## 2017-10-14 ENCOUNTER — Ambulatory Visit (INDEPENDENT_AMBULATORY_CARE_PROVIDER_SITE_OTHER): Payer: Medicare Other

## 2017-10-14 ENCOUNTER — Encounter (HOSPITAL_COMMUNITY): Payer: Self-pay | Admitting: Emergency Medicine

## 2017-10-14 ENCOUNTER — Other Ambulatory Visit: Payer: Self-pay

## 2017-10-14 ENCOUNTER — Telehealth: Payer: Self-pay

## 2017-10-14 DIAGNOSIS — Z955 Presence of coronary angioplasty implant and graft: Secondary | ICD-10-CM

## 2017-10-14 DIAGNOSIS — I5022 Chronic systolic (congestive) heart failure: Secondary | ICD-10-CM

## 2017-10-14 DIAGNOSIS — E785 Hyperlipidemia, unspecified: Secondary | ICD-10-CM | POA: Diagnosis present

## 2017-10-14 DIAGNOSIS — Z23 Encounter for immunization: Secondary | ICD-10-CM

## 2017-10-14 DIAGNOSIS — I252 Old myocardial infarction: Secondary | ICD-10-CM

## 2017-10-14 DIAGNOSIS — Z95810 Presence of automatic (implantable) cardiac defibrillator: Secondary | ICD-10-CM

## 2017-10-14 DIAGNOSIS — I472 Ventricular tachycardia: Secondary | ICD-10-CM | POA: Diagnosis present

## 2017-10-14 DIAGNOSIS — Z951 Presence of aortocoronary bypass graft: Secondary | ICD-10-CM

## 2017-10-14 DIAGNOSIS — F419 Anxiety disorder, unspecified: Secondary | ICD-10-CM | POA: Diagnosis present

## 2017-10-14 DIAGNOSIS — Z885 Allergy status to narcotic agent status: Secondary | ICD-10-CM

## 2017-10-14 DIAGNOSIS — R110 Nausea: Secondary | ICD-10-CM | POA: Diagnosis not present

## 2017-10-14 DIAGNOSIS — I214 Non-ST elevation (NSTEMI) myocardial infarction: Secondary | ICD-10-CM

## 2017-10-14 DIAGNOSIS — I200 Unstable angina: Secondary | ICD-10-CM

## 2017-10-14 DIAGNOSIS — I110 Hypertensive heart disease with heart failure: Secondary | ICD-10-CM | POA: Diagnosis present

## 2017-10-14 DIAGNOSIS — Z79899 Other long term (current) drug therapy: Secondary | ICD-10-CM

## 2017-10-14 DIAGNOSIS — I25110 Atherosclerotic heart disease of native coronary artery with unstable angina pectoris: Secondary | ICD-10-CM | POA: Diagnosis present

## 2017-10-14 DIAGNOSIS — R079 Chest pain, unspecified: Secondary | ICD-10-CM | POA: Diagnosis not present

## 2017-10-14 DIAGNOSIS — Z8614 Personal history of Methicillin resistant Staphylococcus aureus infection: Secondary | ICD-10-CM

## 2017-10-14 DIAGNOSIS — F17210 Nicotine dependence, cigarettes, uncomplicated: Secondary | ICD-10-CM | POA: Diagnosis present

## 2017-10-14 DIAGNOSIS — R0602 Shortness of breath: Secondary | ICD-10-CM | POA: Diagnosis not present

## 2017-10-14 DIAGNOSIS — Z8249 Family history of ischemic heart disease and other diseases of the circulatory system: Secondary | ICD-10-CM

## 2017-10-14 DIAGNOSIS — Z7982 Long term (current) use of aspirin: Secondary | ICD-10-CM

## 2017-10-14 DIAGNOSIS — I213 ST elevation (STEMI) myocardial infarction of unspecified site: Principal | ICD-10-CM | POA: Diagnosis present

## 2017-10-14 DIAGNOSIS — I255 Ischemic cardiomyopathy: Secondary | ICD-10-CM | POA: Diagnosis present

## 2017-10-14 DIAGNOSIS — Z823 Family history of stroke: Secondary | ICD-10-CM

## 2017-10-14 DIAGNOSIS — R42 Dizziness and giddiness: Secondary | ICD-10-CM | POA: Diagnosis not present

## 2017-10-14 DIAGNOSIS — H9190 Unspecified hearing loss, unspecified ear: Secondary | ICD-10-CM | POA: Diagnosis present

## 2017-10-14 LAB — BASIC METABOLIC PANEL
Anion gap: 8 (ref 5–15)
BUN: 20 mg/dL (ref 6–20)
CALCIUM: 9.2 mg/dL (ref 8.9–10.3)
CO2: 27 mmol/L (ref 22–32)
Chloride: 103 mmol/L (ref 101–111)
Creatinine, Ser: 0.97 mg/dL (ref 0.61–1.24)
GFR calc Af Amer: >60 mL/min (ref >60)
GLUCOSE: 100 mg/dL — AB (ref 65–99)
Potassium: 4 mmol/L (ref 3.5–5.1)
Sodium: 138 mmol/L (ref 135–145)

## 2017-10-14 LAB — CBC
HEMATOCRIT: 42.7 % (ref 39.0–52.0)
Hemoglobin: 14 g/dL (ref 13.0–17.0)
MCH: 31.9 pg (ref 26.0–34.0)
MCHC: 32.8 g/dL (ref 30.0–36.0)
MCV: 97.3 fL (ref 78.0–100.0)
Platelets: 235 10*3/uL (ref 150–400)
RBC: 4.39 MIL/uL (ref 4.22–5.81)
RDW: 12.8 % (ref 11.5–15.5)
WBC: 11.1 10*3/uL — ABNORMAL HIGH (ref 4.0–10.5)

## 2017-10-14 LAB — TROPONIN I

## 2017-10-14 MED ORDER — ASPIRIN 81 MG PO CHEW
324.0000 mg | CHEWABLE_TABLET | Freq: Once | ORAL | Status: AC
  Administered 2017-10-14: 324 mg via ORAL
  Filled 2017-10-14: qty 4

## 2017-10-14 MED ORDER — NITROGLYCERIN IN D5W 200-5 MCG/ML-% IV SOLN
5.0000 ug/min | Freq: Once | INTRAVENOUS | Status: AC
  Administered 2017-10-15: 5 ug/min via INTRAVENOUS
  Filled 2017-10-14: qty 250

## 2017-10-14 MED ORDER — NITROGLYCERIN 0.4 MG SL SUBL
0.4000 mg | SUBLINGUAL_TABLET | Freq: Once | SUBLINGUAL | Status: AC
  Administered 2017-10-14: 0.4 mg via SUBLINGUAL
  Filled 2017-10-14: qty 1

## 2017-10-14 MED ORDER — FENTANYL CITRATE (PF) 100 MCG/2ML IJ SOLN
50.0000 ug | Freq: Once | INTRAMUSCULAR | Status: AC
  Administered 2017-10-15: 50 ug via INTRAVENOUS
  Filled 2017-10-14: qty 2

## 2017-10-14 NOTE — Progress Notes (Signed)
EPIC Encounter for ICM Monitoring  Patient Name: Billy JacquetRobert E Zalenski is a 62 y.o. male Date: 10/14/2017 Primary Care Physican: Adrian PrinceSouth, Stephen, MD Primary Cardiologist:Jordan Electrophysiologist: Allred Dry Weight:unknown            Attempted call to patient and unable to reach.  Left detailed message regarding transmission.  Transmission reviewed.    Thoracic impedance normal.  No diuretic.   Recommendations: Left voice mail with ICM number and encouraged to call if experiencing any fluid symptoms.  Follow-up plan: ICM clinic phone appointment on 11/14/2017.    Copy of ICM check sent to Dr. Johney FrameAllred.   3 month ICM trend: 10/14/2017    1 Year ICM trend:       Karie SodaLaurie S Orphia Mctigue, RN 10/14/2017 2:12 PM

## 2017-10-14 NOTE — ED Triage Notes (Signed)
Cp in lt side of chest with nausea started x 1 hour ago

## 2017-10-14 NOTE — Telephone Encounter (Signed)
Remote ICM transmission received.  Attempted call to patient and left detailed message per DPR regarding transmission and next ICM scheduled for 11/14/2017.  Advised to return call for any fluid symptoms or questions.    

## 2017-10-14 NOTE — ED Provider Notes (Signed)
[facility] EMERGENCY DEPARTMENT Provider Note   CSN: [id number] Arrival date & time: 10/14/17  2154     History   Chief Complaint Chief Complaint  Patient presents with  . Chest Pain    HPI [name] is a 62 y.o. male.  Patient with history of CAD status post CABG in 2012 with AICD in place after cardiac arrest presenting with central chest pain radiating to left arm that onset around 8:30 PM while he was sitting watching television.  This pain is been constant and gradually improving.  He denies any radiation to his neck or his back.  There was some shortness of breath and nausea which has since improved.  He is never had this pain before.  He did not take any aspirin or nitroglycerin at home.  States compliance with his aspirin and Plavix.  Denies any leg pain or leg swelling.  The pain came on at rest and nothing makes it better nothing makes it worse.  Reports his last stress test in June was reassuring per Dr. Swaziland.   The history is provided by the patient and the spouse.  Chest Pain   Associated symptoms include nausea and shortness of breath. Pertinent negatives include no abdominal pain, no dizziness, no fever, no headaches, no vomiting and no weakness.    Past Medical History:  Diagnosis Date  . Acute anterior myocardial infarction (HCC) 09/02/2008   post emergent stenting of the LAD in January 2010 -- anterior ST-elevation myocardial infarction  . Adrenal mass (HCC)    currently being evaluated -- 4.1 cm right adrenal mass  . Anemia   . CAD (coronary artery disease)    Est. EF of 45% -- Successful intracoronary stenting of the mid-LAD -- Three-vessel atherosclerotic coronary artery disease.  The patient has occlusion of the mid LAD, which is his culprit lesion.  He has  moderate disease in the proximal circumflex and PDA -- Moderate left ventricular dysfunction -- [name], [name]  . Cardiac arrest - ventricular fibrillation 09/02/2008   Out of hospital  cardiac arrest secondary to arrhythmia  . CHF (congestive heart failure) (HCC)   . CHF NYHA class II (HCC)     New York Heart Association class II/III heart failure  . Ejection fraction < 50%    30-35%  . History of methicillin resistant staphylococcus aureus (MRSA)    History of methicillin-sensitive Staphylococcus aureus bacteremia  with implantable cardioverter-defibrillator explant, February 23, 2009  . HL (hearing loss)   . Hyperlipidemia   . Hypertension   . Ischemic cardiomyopathy    severe ischemic cardiomyopathy -- ejection fraction 30-35%  . Pericarditis    history of acute pericarditis in July 2010 now resolved  . Polymorphic ventricular tachycardia (HCC)    with VF arrest, s/p subsequent ICD  . S/P CABG (coronary artery bypass graft) Sept 2012    Patient Active Problem List   Diagnosis Date Noted  . Acute myocardial infarction, subendocardial infarction, initial episode of care (HCC) 03/04/2013  . ICD-Medtronic 06/24/2012  . S/P CABG x 3 05/31/2011  . Adrenal mass (HCC)   . METHICILLIN SUSCEPTIBLE STAPH AUREUS SEPTICEMIA 04/27/2009  . HYPERCHOLESTEROLEMIA 12/14/2008  . HYPOMAGNESEMIA 12/14/2008  . HYPOKALEMIA 12/14/2008  . TOBACCO ABUSE 12/14/2008  . ENCEPHALOPATHY, ANOXIC 12/14/2008  . HYPERTENSION 12/14/2008  . CAD (coronary artery disease) 12/14/2008  . VENTRICULAR TACHYCARDIA 12/14/2008  . Cardiac arrest (HCC) 12/14/2008  . CHF 12/14/2008  . Chronic systolic CHF (congestive heart failure) (HCC) 12/14/2008  .  UNSPECIFIED CARDIOVASCULAR DISEASE 12/14/2008    Past Surgical History:  Procedure Laterality Date  . ANKLE SURGERY    . CARDIAC CATHETERIZATION  08/26/2008   Est. EF of 45% -- Successful intracoronary stenting of the mid-LAD -- Three-vessel atherosclerotic coronary artery disease.  The patient has occlusion of the mid LAD, which is his culprit lesion.  He has  moderate disease in the proximal circumflex and PDA -- Moderate left ventricular dysfunction --  [name], [name]  . CARDIAC CATHETERIZATION  03/16/2009    EF was approximately 30% -- Atherosclerotic coronary vascular disease, three-vessel --  Moderate-to-severe ischemic cardiomyopathy -- No clear evidence for an acute culprit lesion that would explain the patient's chest pain -- [name], [name].   . CORONARY ARTERY BYPASS GRAFT  05/07/2011   CABG x3:  LIMA to LAD, SVG to OM2, SVG to PDA  . ICD implant  02/23/2009    with subsequent extraction for MSSA bacteremia  . ICD reimplantation  05/09/2009   status post implant of a Medtronic Maximo II DR dual- chamber cardioverter defibrillator by Dr. Hillis Range  . IMPLANTABLE CARDIOVERTER DEFIBRILLATOR REVISION  03/05/13   RV lead fracture.  A new [employer] [id number] lead was placed  . LEAD REVISION Left 03/05/2013   Procedure: LEAD REVISION;  Surgeon: Hillis Range, MD;  Location: [facility] CATH LAB;  Service: Cardiovascular;  Laterality: Left;       Home Medications    Prior to Admission medications   Medication Sig Start Date End Date Taking? Authorizing Provider  aspirin EC 81 MG tablet Take 1 tablet (81 mg total) by mouth daily. 02/07/17   Sw[place]iland, Peter M, MD  atorvastatin (LIPITOR) 80 MG tablet TAKE 1 TABLET BY MOUTH AT BEDTIME 05/02/17   [place], Peter M, MD  carvedilol (COREG) 25 MG tablet TAKE 1 TABLET BY MOUTH TWICE DAILY WITH A MEAL 05/02/17   [place], Peter M, MD  nitroGLYCERIN (NITROSTAT) 0.4 MG SL tablet Place 1 tablet (0.4 mg total) under the tongue every 5 (five) minutes x 3 doses as needed for chest pain. IF NEEDED 12/08/13   [place], Peter M, MD  ramipril (ALTACE) 10 MG capsule TAKE 1 CAPSULE BY MOUTH TWICE DAILY 04/14/17   Gypsy Balsam K, NP  spironolactone (ALDACTONE) 25 MG tablet Take 1 tablet (25 mg total) by mouth daily. 04/14/17   [place], Peter M, MD    Family History Family History  Problem Relation Age of Onset  . Stroke Mother   . Coronary artery disease Father     Social History Social History   Tobacco Use    . Smoking status: Current Every Day Smoker    Years: 37.00    Types: Cigarettes  . Smokeless tobacco: Never Used  . Tobacco comment: 1 pack per week. Has been smoking for 35 years.   Substance Use Topics  . Alcohol use: No  . Drug use: No     Allergies   Morphine and related   Review of Systems Review of Systems  Constitutional: Negative for activity change, appetite change and fever.  HENT: Negative for congestion and rhinorrhea.   Eyes: Negative for visual disturbance.  Respiratory: Positive for chest tightness and shortness of breath.   Cardiovascular: Positive for chest pain. Negative for leg swelling.  Gastrointestinal: Positive for nausea. Negative for abdominal pain and vomiting.  Genitourinary: Negative for dysuria and hematuria.  Musculoskeletal: Negative for arthralgias and myalgias.  Skin: Negative for rash.  Neurological: Positive for light-headedness. Negative for  dizziness, weakness and headaches.   all other systems are negative except as noted in the HPI and PMH.    Physical Exam Updated Vital Signs BP 131/87   Pulse 60   Temp 97.9 F (36.6 C) (Oral)   Resp 12   Ht 5' (1.524 m)   Wt 74.8 kg (165 lb)   SpO2 98%   BMI 32.22 kg/m   Physical Exam  Constitutional: He is oriented to person, place, and time. He appears well-developed and well-nourished. No distress.  HENT:  Head: Normocephalic and atraumatic.  Mouth/Throat: Oropharynx is clear and moist. No oropharyngeal exudate.  Eyes: Conjunctivae and EOM are normal. Pupils are equal, round, and reactive to light.  Neck: Normal range of motion. Neck supple.  No meningismus.  Cardiovascular: Normal rate, regular rhythm, normal heart sounds and intact distal pulses.  No murmur heard. Pulmonary/Chest: Effort normal and breath sounds normal. No respiratory distress. He exhibits no tenderness.  Abdominal: Soft. There is no tenderness. There is no rebound and no guarding.  Musculoskeletal: Normal range of  motion. He exhibits no edema or tenderness.  Neurological: He is alert and oriented to person, place, and time. No cranial nerve deficit. He exhibits normal muscle tone. Coordination normal.  No ataxia on finger to nose bilaterally. No pronator drift. 5/5 strength throughout. CN 2-12 intact.Equal grip strength. Sensation intact.   Skin: Skin is warm.  Psychiatric: He has a normal mood and affect. His behavior is normal.  Nursing note and vitals reviewed.    ED Treatments / Results  Labs (all labs ordered are listed, but only abnormal results are displayed) Labs Reviewed  BASIC METABOLIC PANEL - Abnormal; Notable for the following components:      Result Value   Glucose, Bld 100 (*)    All other components within normal limits  CBC - Abnormal; Notable for the following components:   WBC 11.1 (*)    All other components within normal limits  TROPONIN I  APTT  PROTIME-INR  TROPONIN I  TROPONIN I  TROPONIN I  HEPARIN LEVEL (UNFRACTIONATED)    EKG  EKG Interpretation  Date/Time:  Tuesday October 14 2017 22:04:49 EST Ventricular Rate:  64 PR Interval:  246 QRS Duration: 98 QT Interval:  384 QTC Calculation: 396 R Axis:   80 Text Interpretation:  Atrial-paced rhythm with prolonged AV conduction Anterolateral infarct , age undetermined Abnormal ECG No significant change was found Confirmed by Glynn Octaveancour, Nechuma Boven [phone]([id number]) on 10/14/2017 10:59:26 PM       Radiology Dg Chest 2 View  Result Date: 10/14/2017 CLINICAL DATA:  Mid to left-sided chest pain. EXAM: CHEST  2 VIEW COMPARISON:  Radiographs 03/06/2013 FINDINGS: Right-sided pacemaker in place. Post median sternotomy. Coronary artery calcifications or stents. Mild cardiomegaly is unchanged from prior exam. Unchanged mediastinal contours with aortic atherosclerosis. Central bronchial thickening which is new. There is chronic hyperinflation. No consolidation. No pleural fluid or pneumothorax. No acute osseous abnormalities.  IMPRESSION: 1. Bronchial thickening, new from prior exam. 2. Chronic hyperinflation. Post CABG. Right-sided pacemaker in place. Electronically Signed   By: Rubye OaksMelanie  Ehinger [name].   On: 10/14/2017 23:14    Procedures Procedures (including critical care time)  Medications Ordered in ED Medications  aspirin chewable tablet 324 mg (324 mg Oral Given 10/14/17 2325)  nitroGLYCERIN (NITROSTAT) SL tablet 0.4 mg (0.4 mg Sublingual Given 10/14/17 2326)     Initial Impression / Assessment and Plan / ED Course  I have reviewed the triage vital signs and the nursing  notes.  Pertinent labs & imaging results that were available during my care of the patient were reviewed by me and considered in my medical decision making (see chart for details).    Patient with history of CAD status post CABG presenting with chest pain and nausea and shortness of breath at onset at rest.  EKG without acute ischemia.  Patient given aspirin and nitroglycerin.  Patient did have a stress test in June 2018 that showed multiple areas of prior infarct but no acute ischemia.  First troponin is negative.  Patient's pain is improving after nitroglycerin.  Will start IV nitroglycerin and IV heparin with concern for unstable angina. Low suspicion for aortic dissection or pulmonary embolism.  Admission discussed with Dr. Robb Matar.  CRITICAL CARE Performed by: Glynn Octave Total critical care time: 35 minutes Critical care time was exclusive of separately billable procedures and treating other patients. Critical care was necessary to treat or prevent imminent or life-threatening deterioration. Critical care was time spent personally by me on the following activities: development of treatment plan with patient and/or surrogate as well as nursing, discussions with consultants, evaluation of patient's response to treatment, examination of patient, obtaining history from patient or surrogate, ordering and performing treatments and  interventions, ordering and review of laboratory studies, ordering and review of radiographic studies, pulse oximetry and re-evaluation of patient's condition.  Final Clinical Impressions(s) / ED Diagnoses   Final diagnoses:  Unstable angina [facility])    ED Discharge Orders    None       Glynn Octave, MD 10/15/17 [phone]

## 2017-10-15 ENCOUNTER — Other Ambulatory Visit: Payer: Self-pay

## 2017-10-15 ENCOUNTER — Encounter (HOSPITAL_COMMUNITY)
Admission: EM | Disposition: A | Discharge status: Home / Self Care | Payer: Self-pay | Source: Home / Self Care | Attending: Cardiology

## 2017-10-15 ENCOUNTER — Encounter (HOSPITAL_COMMUNITY): Payer: Self-pay | Admitting: Cardiology

## 2017-10-15 DIAGNOSIS — I214 Non-ST elevation (NSTEMI) myocardial infarction: Secondary | ICD-10-CM | POA: Diagnosis present

## 2017-10-15 DIAGNOSIS — I252 Old myocardial infarction: Secondary | ICD-10-CM | POA: Diagnosis not present

## 2017-10-15 DIAGNOSIS — F419 Anxiety disorder, unspecified: Secondary | ICD-10-CM | POA: Diagnosis present

## 2017-10-15 DIAGNOSIS — Z8249 Family history of ischemic heart disease and other diseases of the circulatory system: Secondary | ICD-10-CM | POA: Diagnosis not present

## 2017-10-15 DIAGNOSIS — I25110 Atherosclerotic heart disease of native coronary artery with unstable angina pectoris: Secondary | ICD-10-CM | POA: Diagnosis not present

## 2017-10-15 DIAGNOSIS — F17210 Nicotine dependence, cigarettes, uncomplicated: Secondary | ICD-10-CM | POA: Diagnosis present

## 2017-10-15 DIAGNOSIS — I5022 Chronic systolic (congestive) heart failure: Secondary | ICD-10-CM | POA: Diagnosis present

## 2017-10-15 DIAGNOSIS — I200 Unstable angina: Secondary | ICD-10-CM | POA: Diagnosis not present

## 2017-10-15 DIAGNOSIS — Z951 Presence of aortocoronary bypass graft: Secondary | ICD-10-CM | POA: Diagnosis not present

## 2017-10-15 DIAGNOSIS — I213 ST elevation (STEMI) myocardial infarction of unspecified site: Secondary | ICD-10-CM | POA: Diagnosis present

## 2017-10-15 DIAGNOSIS — E785 Hyperlipidemia, unspecified: Secondary | ICD-10-CM | POA: Diagnosis present

## 2017-10-15 DIAGNOSIS — Z23 Encounter for immunization: Secondary | ICD-10-CM | POA: Diagnosis not present

## 2017-10-15 DIAGNOSIS — Z955 Presence of coronary angioplasty implant and graft: Secondary | ICD-10-CM | POA: Diagnosis not present

## 2017-10-15 DIAGNOSIS — I2510 Atherosclerotic heart disease of native coronary artery without angina pectoris: Secondary | ICD-10-CM | POA: Diagnosis not present

## 2017-10-15 DIAGNOSIS — I255 Ischemic cardiomyopathy: Secondary | ICD-10-CM | POA: Diagnosis not present

## 2017-10-15 DIAGNOSIS — Z7982 Long term (current) use of aspirin: Secondary | ICD-10-CM | POA: Diagnosis not present

## 2017-10-15 DIAGNOSIS — Z8614 Personal history of Methicillin resistant Staphylococcus aureus infection: Secondary | ICD-10-CM | POA: Diagnosis not present

## 2017-10-15 DIAGNOSIS — I110 Hypertensive heart disease with heart failure: Secondary | ICD-10-CM | POA: Diagnosis present

## 2017-10-15 DIAGNOSIS — Z79899 Other long term (current) drug therapy: Secondary | ICD-10-CM | POA: Diagnosis not present

## 2017-10-15 DIAGNOSIS — Z823 Family history of stroke: Secondary | ICD-10-CM | POA: Diagnosis not present

## 2017-10-15 DIAGNOSIS — I472 Ventricular tachycardia: Secondary | ICD-10-CM | POA: Diagnosis present

## 2017-10-15 DIAGNOSIS — Z95810 Presence of automatic (implantable) cardiac defibrillator: Secondary | ICD-10-CM

## 2017-10-15 DIAGNOSIS — H9190 Unspecified hearing loss, unspecified ear: Secondary | ICD-10-CM | POA: Diagnosis present

## 2017-10-15 DIAGNOSIS — Z885 Allergy status to narcotic agent status: Secondary | ICD-10-CM | POA: Diagnosis not present

## 2017-10-15 HISTORY — PX: LEFT HEART CATH AND CORS/GRAFTS ANGIOGRAPHY: CATH118250

## 2017-10-15 HISTORY — PX: CORONARY STENT INTERVENTION: CATH118234

## 2017-10-15 LAB — TROPONIN I
Troponin I: 0.6 ng/mL (ref <0.03)
Troponin I: 10.06 ng/mL (ref <0.03)

## 2017-10-15 LAB — PROTIME-INR
INR: 0.96
PROTHROMBIN TIME: 12.7 s (ref 11.4–15.2)

## 2017-10-15 LAB — CREATININE, SERUM: Creatinine, Ser: 0.81 mg/dL (ref 0.61–1.24)

## 2017-10-15 LAB — CARDIAC CATHETERIZATION: Cath EF Quantitative: 35 %

## 2017-10-15 LAB — POCT ACTIVATED CLOTTING TIME: ACTIVATED CLOTTING TIME: 351 s

## 2017-10-15 LAB — MAGNESIUM: MAGNESIUM: 1.7 mg/dL (ref 1.7–2.4)

## 2017-10-15 LAB — APTT: aPTT: 36 seconds (ref 24–36)

## 2017-10-15 LAB — HEPARIN LEVEL (UNFRACTIONATED): HEPARIN UNFRACTIONATED: 0.27 [IU]/mL — AB (ref 0.30–0.70)

## 2017-10-15 SURGERY — LEFT HEART CATH AND CORS/GRAFTS ANGIOGRAPHY
Anesthesia: LOCAL

## 2017-10-15 MED ORDER — LIDOCAINE HCL (PF) 1 % IJ SOLN
INTRAMUSCULAR | Status: DC | PRN
  Administered 2017-10-15: 2 mL

## 2017-10-15 MED ORDER — MIDAZOLAM HCL 2 MG/2ML IJ SOLN
INTRAMUSCULAR | Status: DC | PRN
  Administered 2017-10-15: 1 mg via INTRAVENOUS

## 2017-10-15 MED ORDER — ATORVASTATIN CALCIUM 80 MG PO TABS
80.0000 mg | ORAL_TABLET | Freq: Every day | ORAL | Status: DC
  Administered 2017-10-15 – 2017-10-16 (×2): 80 mg via ORAL
  Filled 2017-10-15 (×2): qty 1

## 2017-10-15 MED ORDER — ALPRAZOLAM 0.25 MG PO TABS
ORAL_TABLET | ORAL | Status: AC
  Filled 2017-10-15: qty 1

## 2017-10-15 MED ORDER — FENTANYL CITRATE (PF) 100 MCG/2ML IJ SOLN
INTRAMUSCULAR | Status: AC
  Filled 2017-10-15: qty 2

## 2017-10-15 MED ORDER — LIDOCAINE HCL (PF) 1 % IJ SOLN
INTRAMUSCULAR | Status: AC
  Filled 2017-10-15: qty 30

## 2017-10-15 MED ORDER — SODIUM CHLORIDE 0.9% FLUSH
3.0000 mL | Freq: Two times a day (BID) | INTRAVENOUS | Status: DC
  Administered 2017-10-15 – 2017-10-16 (×3): 3 mL via INTRAVENOUS

## 2017-10-15 MED ORDER — ONDANSETRON HCL 4 MG/2ML IJ SOLN: 4.0000 mg | Freq: Four times a day (QID) | INTRAMUSCULAR | Status: DC | PRN

## 2017-10-15 MED ORDER — ACETAMINOPHEN 325 MG PO TABS
650.0000 mg | ORAL_TABLET | ORAL | Status: DC | PRN
  Administered 2017-10-15: 15:00:00 650 mg via ORAL
  Filled 2017-10-15: qty 2

## 2017-10-15 MED ORDER — TICAGRELOR 90 MG PO TABS
ORAL_TABLET | ORAL | Status: AC
  Filled 2017-10-15: qty 2

## 2017-10-15 MED ORDER — IOPAMIDOL (ISOVUE-370) INJECTION 76%
INTRAVENOUS | Status: AC
  Filled 2017-10-15: qty 100

## 2017-10-15 MED ORDER — FENTANYL CITRATE (PF) 100 MCG/2ML IJ SOLN
INTRAMUSCULAR | Status: DC | PRN
  Administered 2017-10-15: 25 ug via INTRAVENOUS

## 2017-10-15 MED ORDER — HEPARIN (PORCINE) IN NACL 2-0.9 UNIT/ML-% IJ SOLN
INTRAMUSCULAR | Status: AC | PRN
  Administered 2017-10-15 (×2): 500 mL

## 2017-10-15 MED ORDER — ENOXAPARIN SODIUM 40 MG/0.4ML ~~LOC~~ SOLN: 40.0000 mg | SUBCUTANEOUS | Status: DC

## 2017-10-15 MED ORDER — HEPARIN (PORCINE) IN NACL 100-0.45 UNIT/ML-% IJ SOLN
900.0000 [IU]/h | INTRAMUSCULAR | Status: DC
  Administered 2017-10-15: 800 [IU]/h via INTRAVENOUS
  Filled 2017-10-15: qty 250

## 2017-10-15 MED ORDER — IOPAMIDOL (ISOVUE-370) INJECTION 76%
INTRAVENOUS | Status: DC | PRN
  Administered 2017-10-15: 190 mL via INTRA_ARTERIAL

## 2017-10-15 MED ORDER — HEPARIN SODIUM (PORCINE) 1000 UNIT/ML IJ SOLN
INTRAMUSCULAR | Status: AC
  Filled 2017-10-15: qty 1

## 2017-10-15 MED ORDER — ALPRAZOLAM 0.25 MG PO TABS
0.2500 mg | ORAL_TABLET | Freq: Three times a day (TID) | ORAL | Status: DC | PRN
  Administered 2017-10-15: 0.25 mg via ORAL

## 2017-10-15 MED ORDER — VERAPAMIL HCL 2.5 MG/ML IV SOLN
INTRAVENOUS | Status: AC
  Filled 2017-10-15: qty 2

## 2017-10-15 MED ORDER — IOPAMIDOL (ISOVUE-370) INJECTION 76%
INTRAVENOUS | Status: AC
  Filled 2017-10-15: qty 50

## 2017-10-15 MED ORDER — ACETAMINOPHEN 325 MG PO TABS: 650.0000 mg | ORAL_TABLET | Freq: Four times a day (QID) | ORAL | Status: DC | PRN

## 2017-10-15 MED ORDER — SODIUM CHLORIDE 0.9 % WEIGHT BASED INFUSION: 1.0000 mL/kg/h | INTRAVENOUS | Status: AC

## 2017-10-15 MED ORDER — SODIUM CHLORIDE 0.9 % IV SOLN: 250.0000 mL | INTRAVENOUS | Status: DC | PRN

## 2017-10-15 MED ORDER — MIDAZOLAM HCL 2 MG/2ML IJ SOLN
INTRAMUSCULAR | Status: AC
  Filled 2017-10-15: qty 2

## 2017-10-15 MED ORDER — SODIUM CHLORIDE 0.9% FLUSH: 3.0000 mL | INTRAVENOUS | Status: DC | PRN

## 2017-10-15 MED ORDER — HEPARIN (PORCINE) IN NACL 2-0.9 UNIT/ML-% IJ SOLN
INTRAMUSCULAR | Status: DC | PRN
  Administered 2017-10-15: 10 mL via INTRA_ARTERIAL

## 2017-10-15 MED ORDER — SPIRONOLACTONE 25 MG PO TABS
25.0000 mg | ORAL_TABLET | Freq: Every day | ORAL | Status: DC
  Administered 2017-10-15 – 2017-10-17 (×3): 25 mg via ORAL
  Filled 2017-10-15 (×3): qty 1

## 2017-10-15 MED ORDER — CARVEDILOL 12.5 MG PO TABS
25.0000 mg | ORAL_TABLET | Freq: Two times a day (BID) | ORAL | Status: DC
  Administered 2017-10-15 – 2017-10-17 (×4): 25 mg via ORAL
  Filled 2017-10-15 (×5): qty 2

## 2017-10-15 MED ORDER — ASPIRIN 81 MG PO CHEW
81.0000 mg | CHEWABLE_TABLET | Freq: Every day | ORAL | Status: DC
  Administered 2017-10-15 – 2017-10-17 (×3): 81 mg via ORAL
  Filled 2017-10-15 (×3): qty 1

## 2017-10-15 MED ORDER — ONDANSETRON HCL 4 MG PO TABS: 4.0000 mg | ORAL_TABLET | Freq: Four times a day (QID) | ORAL | Status: DC | PRN

## 2017-10-15 MED ORDER — HEPARIN (PORCINE) IN NACL 2-0.9 UNIT/ML-% IJ SOLN
INTRAMUSCULAR | Status: AC
  Filled 2017-10-15: qty 1000

## 2017-10-15 MED ORDER — HEPARIN SODIUM (PORCINE) 1000 UNIT/ML IJ SOLN
INTRAMUSCULAR | Status: DC | PRN
  Administered 2017-10-15: 4000 [IU] via INTRAVENOUS
  Administered 2017-10-15: 3000 [IU] via INTRAVENOUS

## 2017-10-15 MED ORDER — ACETAMINOPHEN 650 MG RE SUPP: 650.0000 mg | Freq: Four times a day (QID) | RECTAL | Status: DC | PRN

## 2017-10-15 MED ORDER — HEPARIN SODIUM (PORCINE) 5000 UNIT/ML IJ SOLN
5000.0000 [IU] | Freq: Three times a day (TID) | INTRAMUSCULAR | Status: DC
  Administered 2017-10-15 – 2017-10-17 (×5): 5000 [IU] via SUBCUTANEOUS
  Filled 2017-10-15 (×5): qty 1

## 2017-10-15 MED ORDER — TICAGRELOR 90 MG PO TABS
ORAL_TABLET | ORAL | Status: DC | PRN
  Administered 2017-10-15: 180 mg via ORAL

## 2017-10-15 MED ORDER — HEPARIN BOLUS VIA INFUSION
4000.0000 [IU] | Freq: Once | INTRAVENOUS | Status: AC
Start: 2017-10-15 — End: 2017-10-15
  Administered 2017-10-15: 4000 [IU] via INTRAVENOUS

## 2017-10-15 MED ORDER — RAMIPRIL 10 MG PO CAPS
10.0000 mg | ORAL_CAPSULE | Freq: Two times a day (BID) | ORAL | Status: DC
  Administered 2017-10-15 – 2017-10-17 (×4): 10 mg via ORAL
  Filled 2017-10-15 (×4): qty 1

## 2017-10-15 MED ORDER — ACETAMINOPHEN 325 MG PO TABS: 650.0000 mg | ORAL_TABLET | ORAL | Status: DC | PRN

## 2017-10-15 MED ORDER — TICAGRELOR 90 MG PO TABS
90.0000 mg | ORAL_TABLET | Freq: Two times a day (BID) | ORAL | Status: DC
  Administered 2017-10-15 – 2017-10-17 (×4): 90 mg via ORAL
  Filled 2017-10-15 (×4): qty 1

## 2017-10-15 SURGICAL SUPPLY — 22 items
BALLN EMERGE MR 2.0X12 (BALLOONS) ×2
BALLN SAPPHIRE ~~LOC~~ 3.5X8 (BALLOONS) ×2 IMPLANT
BALLN ~~LOC~~ EMERGE MR 2.25X12 (BALLOONS) ×2
BALLOON EMERGE MR 2.0X12 (BALLOONS) ×1 IMPLANT
BALLOON ~~LOC~~ EMERGE MR 2.25X12 (BALLOONS) ×1 IMPLANT
CATH INFINITI 5 FR RCB (CATHETERS) ×2 IMPLANT
CATH INFINITI 5FR MULTPACK ANG (CATHETERS) ×2 IMPLANT
CATH LAUNCHER 6FR EBU3.5 (CATHETERS) ×2 IMPLANT
DEVICE RAD COMP TR BAND LRG (VASCULAR PRODUCTS) ×2 IMPLANT
GLIDESHEATH SLEND SS 6F .021 (SHEATH) ×2 IMPLANT
GUIDEWIRE INQWIRE 1.5J.035X260 (WIRE) ×1 IMPLANT
INQWIRE 1.5J .035X260CM (WIRE) ×2
KIT ENCORE 26 ADVANTAGE (KITS) ×2 IMPLANT
KIT HEART LEFT (KITS) ×2 IMPLANT
KIT HEMO VALVE WATCHDOG (MISCELLANEOUS) ×2 IMPLANT
PACK CARDIAC CATHETERIZATION (CUSTOM PROCEDURE TRAY) ×2 IMPLANT
STENT SYNERGY DES 2.25X24 (Permanent Stent) ×2 IMPLANT
STENT SYNERGY DES 3X12 (Permanent Stent) ×2 IMPLANT
SYR MEDRAD MARK V 150ML (SYRINGE) ×2 IMPLANT
TRANSDUCER W/STOPCOCK (MISCELLANEOUS) ×2 IMPLANT
TUBING CIL FLEX 10 FLL-RA (TUBING) ×2 IMPLANT
WIRE ASAHI PROWATER 180CM (WIRE) ×2 IMPLANT

## 2017-10-15 NOTE — Progress Notes (Signed)
TR BAND REMOVAL  LOCATION:    left radial  DEFLATED PER PROTOCOL:    Yes.    TIME BAND OFF / DRESSING APPLIED:    1645   SITE UPON ARRIVAL:    Level 0  SITE AFTER BAND REMOVAL:    Level 0  CIRCULATION SENSATION AND MOVEMENT:    Within Normal Limits   Yes.    COMMENTS:   Tolerated procedure well

## 2017-10-15 NOTE — Progress Notes (Signed)
#   4.  S/W CORE[employer] @ [employer]/ [employer] RX # [phone]   PATIENT NEED TO UP DATE INS INFO WITH ADMISSION    BRILINTA 90 MG BID  COVER- YES  CO-PAY- $ 47.00  TIER- 3 DRUG  PRIOR APPROVAL- NO   PREFERRED PHARMACY : [facility]

## 2017-10-15 NOTE — ED Notes (Signed)
CRITICAL VALUE ALERT  Critical Value:  Troponin 0.60  Date & Time Notied:  10/15/17 at 0310  Provider Notified: Robb Matarrtiz, MD   Orders Received/Actions taken: No orders at this time

## 2017-10-15 NOTE — ED Notes (Signed)
Pt has not eat any of his breakfast tray.  Tray removed.  Advised pt not to eat or drink anything else.  Last PO intake was last night

## 2017-10-15 NOTE — Progress Notes (Signed)
ANTICOAGULATION CONSULT NOTE - Preliminary  Pharmacy Consult for Heparin Indication: ACS/STEMI  Allergies  Allergen Reactions  . Morphine And Related Itching    Patient Measurements: Height: 5\' 7"  (170.2 cm) Weight: 165 lb (74.8 kg) IBW/kg (Calculated) : 66.1 HEPARIN DW (KG): 66.2   Vital Signs: Temp: 97.9 F (36.6 C) (02/19 2232) Temp Source: Oral (02/19 2232) BP: 101/65 (02/20 0000) Pulse Rate: 59 (02/20 0000)  Labs: Recent Labs    10/14/17 2224  HGB 14.0  HCT 42.7  PLT 235  APTT 36  LABPROT 12.7  INR 0.96  CREATININE 0.97  TROPONINI <0.03   Estimated Creatinine Clearance: 76.7 mL/min (by C-G formula based on SCr of 0.97 mg/dL).  Medical History: Past Medical History:  Diagnosis Date  . Acute anterior myocardial infarction (HCC) 09/02/2008   post emergent stenting of the LAD in January 2010 -- anterior ST-elevation myocardial infarction  . Adrenal mass (HCC)    currently being evaluated -- 4.1 cm right adrenal mass  . Anemia   . CAD (coronary artery disease)    Est. EF of 45% -- Successful intracoronary stenting of the mid-LAD -- Three-vessel atherosclerotic coronary artery disease.  The patient has occlusion of the mid LAD, which is his culprit lesion.  He has  moderate disease in the proximal circumflex and PDA -- Moderate left ventricular dysfunction -- Peter M. SwazilandJordan, [name]  . Cardiac arrest - ventricular fibrillation 09/02/2008   Out of hospital cardiac arrest secondary to arrhythmia  . CHF (congestive heart failure) (HCC)   . CHF NYHA class II (HCC)     New York Heart Association class II/III heart failure  . Ejection fraction < 50%    30-35%  . History of methicillin resistant staphylococcus aureus (MRSA)    History of methicillin-sensitive Staphylococcus aureus bacteremia  with implantable cardioverter-defibrillator explant, February 23, 2009  . HL (hearing loss)   . Hyperlipidemia   . Hypertension   . Ischemic cardiomyopathy    severe ischemic  cardiomyopathy -- ejection fraction 30-35%  . Pericarditis    history of acute pericarditis in July 2010 now resolved  . Polymorphic ventricular tachycardia (HCC)    with VF arrest, s/p subsequent ICD  . S/P CABG (coronary artery bypass graft) Sept 2012    Medications:   Assessment: 62 yo male with history of CAD s/p CABG seen in the ED for chest pain, nausea, and SOB. Pharmacy has been consulted for IV heparin dosing.  Goal of Therapy:  Heparin level goal: 0.3-0.7 units/ml Monitor platelets by anticoagulation protocol: Yes   Plan:  Heparin bolus: 4000 units Heparin infusion at 800 units/hr Heparin level in 6-8 hours  Preliminary review of pertinent patient information completed.  Jeani HawkingAnnie Penn clinical pharmacist will complete review during morning rounds to assess the patient and finalize treatment regimen.  Arelia SneddonMason, Georganna Maxson Anne, [facility] Atlantic Gastrointestinal CenterRPH 10/15/2017,12:40 AM

## 2017-10-15 NOTE — ED Notes (Signed)
Pt removed hisself from monitor to go to the bathroom.  Advised pt not to remove hisself from monitor anymore.  Advised to notify  RN if he needed the restroom.

## 2017-10-15 NOTE — Interval H&P Note (Signed)
History and Physical Interval Note:  10/15/2017 10:23 AM  [name]  has presented today for surgery, with the diagnosis of urgent  The various methods of treatment have been discussed with the patient and family. After consideration of risks, benefits and other options for treatment, the patient has consented to  Procedure(s): LEFT HEART CATH AND CORS/GRAFTS ANGIOGRAPHY (N/A) as a surgical intervention .  The patient's history has been reviewed, patient examined, no change in status, stable for surgery.  I have reviewed the patient's chart and labs.  Questions were answered to the patient's satisfaction.   Cath Lab Visit (complete for each Cath Lab visit)  Clinical Evaluation Leading to the Procedure:   ACS: Yes.    Non-ACS:    Anginal Classification: CCS IV  Anti-ischemic medical therapy: Maximal Therapy (2 or more classes of medications)  Non-Invasive Test Results: No non-invasive testing performed  Prior CABG: Previous CABG        Billy Crosley SwazilandJordan MD,FACC 10/15/2017 10:23 AM

## 2017-10-15 NOTE — ED Notes (Signed)
Dr.  Robb Matarrtiz notified of run of V - Tach .   Pt to have cardiology consult this morning.

## 2017-10-15 NOTE — ED Notes (Signed)
Pt had a run of V TACH.  Pt asymptomatic.

## 2017-10-15 NOTE — Progress Notes (Signed)
ANTICOAGULATION CONSULT NOTE - follow up  Pharmacy Consult for Heparin Indication: ACS/STEMI  Allergies  Allergen Reactions  . Morphine And Related Itching    Patient Measurements: Height: 5\' 7"  (170.2 cm) Weight: 165 lb (74.8 kg) IBW/kg (Calculated) : 66.1 HEPARIN DW (KG): 66.2   Vital Signs: Temp: 97.9 F (36.6 C) (02/19 2232) Temp Source: Oral (02/19 2232) BP: 128/74 (02/20 0830) Pulse Rate: 60 (02/20 0830)  Labs: Recent Labs    10/14/17 2224 10/15/17 0127 10/15/17 0738  HGB 14.0  --   --   HCT 42.7  --   --   PLT 235  --   --   APTT 36  --   --   LABPROT 12.7  --   --   INR 0.96  --   --   HEPARINUNFRC  --   --  0.27*  CREATININE 0.97  --   --   TROPONINI <0.03 0.60* 10.06*   Estimated Creatinine Clearance: 76.7 mL/min (by C-G formula based on SCr of 0.97 mg/dL).  Medical History: Past Medical History:  Diagnosis Date  . Acute anterior myocardial infarction (HCC) 09/02/2008   post emergent stenting of the LAD in January 2010 -- anterior ST-elevation myocardial infarction  . Adrenal mass (HCC)    currently being evaluated -- 4.1 cm right adrenal mass  . Anemia   . CAD (coronary artery disease)    Est. EF of 45% -- Successful intracoronary stenting of the mid-LAD -- Three-vessel atherosclerotic coronary artery disease.  The patient has occlusion of the mid LAD, which is his culprit lesion.  He has  moderate disease in the proximal circumflex and PDA -- Moderate left ventricular dysfunction -- Peter M. SwazilandJordan, [name]  . Cardiac arrest - ventricular fibrillation 09/02/2008   Out of hospital cardiac arrest secondary to arrhythmia  . CHF (congestive heart failure) (HCC)   . CHF NYHA class II (HCC)     New York Heart Association class II/III heart failure  . Ejection fraction < 50%    30-35%  . History of methicillin resistant staphylococcus aureus (MRSA)    History of methicillin-sensitive Staphylococcus aureus bacteremia  with implantable  cardioverter-defibrillator explant, February 23, 2009  . HL (hearing loss)   . Hyperlipidemia   . Hypertension   . Ischemic cardiomyopathy    severe ischemic cardiomyopathy -- ejection fraction 30-35%  . Pericarditis    history of acute pericarditis in July 2010 now resolved  . Polymorphic ventricular tachycardia (HCC)    with VF arrest, s/p subsequent ICD  . S/P CABG (coronary artery bypass graft) Sept 2012    Medications:   Assessment: 62 yo male with history of CAD s/p CABG seen in the ED for chest pain, nausea, and SOB. Pharmacy has been consulted for IV heparin dosing. Heparin level just below goal.  Goal of Therapy:  Heparin level goal: 0.3-0.7 units/ml Monitor platelets by anticoagulation protocol: Yes   Plan:  Heparin bolus: 1000 units Increase Heparin infusion to  900 units/hr Heparin level in 6-8 hours and daily Monitor for S/S of bleeding  Elder CyphersLorie Andretta Ergle, BS Loura BackPharm D, BCPS Clinical Pharmacist Pager [phone]  10/15/2017,9:25 AM

## 2017-10-15 NOTE — ED Notes (Signed)
Pt states his chest pain is a 4/10.  Restarted  Ntg.  Drip.

## 2017-10-15 NOTE — Progress Notes (Signed)
Patient c/o feeling anxious and sometimes hard to get his breath. VSS. Respirations regular, nonlabored, skin w/d. Dr. SwazilandJordan made aware. Order taken for Xanax

## 2017-10-15 NOTE — Consult Note (Signed)
Cardiology Consultation:   Patient ID: [name]; [id number]; 1956-01-31   Admit date: 10/14/2017 Date of Consult: 10/15/2017  Primary Care Provider: Adrian Prince, MD Primary Cardiologist: Peter Swaziland, MD  Primary Electrophysiologist:  Dr. Johney Frame   Patient Profile:   [name] is a 62 y.o. male with a hx of CAD S/P ant STEMI 2000 s/p DES LAD. One week later out of hospital cardiac arrest with V.fib, repeat cath 50% in stent restenosis treated with PTCA.ICD placed. CABG x3 2012 LIMA-LAD, SVG-OM, SVG-RCA. No ischemia on myoview 02/2017 who is being seen today for the evaluation of chest pain and positive troponins at the request of Dr. Ardyth Harps.  History of Present Illness:   [name] is a 62 yo patient of Dr. Swaziland who was lost to f/u until last summer. He has a hx of CAD S/P ant STEMI 2000 s/p DES LAD. One week later out of hospital cardiac arrest with V.fib, repeat cath 50% in stent restenosis treated with PTCA.ICD placed. CABG x3 2012 LIMA-LAD, SVG-OM, SVG-RCA. No ischemia on myoview 02/2017.  ICD complicated by staph bacteremia and pericarditis had to be replaced.followed by Dr. Johney Frame  ICM EF 35% improved to 40-45% on echo 01/2017. Recent optivol readings elevated but patient asymptomatic-eats fast food.  Yesterday woke up with a panic/anxiety feeling that lasted about 15 min. No chest pain. Walks 1 1/2 miles daily without symptoms but continues to smoke. 8:30 pm last night severe chest tightness came to ER(no NTG at home). No relief with NTG but relief with pain meds. ST eleveation inferiorly on EKG but had some in lead II in past. Troponin jumped to 10 this am, runs of VT-defibrillator no going off, patient asymptomatic with arrhythmia but still with chest pain 2/10 on IV heparin & IV NTG. Also HTN, HLD.   Past Medical History:  Diagnosis Date  . Acute anterior myocardial infarction (HCC) 09/02/2008   post emergent stenting of the LAD in January 2010 -- anterior  ST-elevation myocardial infarction  . Adrenal mass (HCC)    currently being evaluated -- 4.1 cm right adrenal mass  . Anemia   . CAD (coronary artery disease)    Est. EF of 45% -- Successful intracoronary stenting of the mid-LAD -- Three-vessel atherosclerotic coronary artery disease.  The patient has occlusion of the mid LAD, which is his culprit lesion.  He has  moderate disease in the proximal circumflex and PDA -- Moderate left ventricular dysfunction -- [name], [name]  . Cardiac arrest - ventricular fibrillation 09/02/2008   Out of hospital cardiac arrest secondary to arrhythmia  . CHF (congestive heart failure) (HCC)   . CHF NYHA class II (HCC)     New York Heart Association class II/III heart failure  . Ejection fraction < 50%    30-35%  . History of methicillin resistant staphylococcus aureus (MRSA)    History of methicillin-sensitive Staphylococcus aureus bacteremia  with implantable cardioverter-defibrillator explant, February 23, 2009  . HL (hearing loss)   . Hyperlipidemia   . Hypertension   . Ischemic cardiomyopathy    severe ischemic cardiomyopathy -- ejection fraction 30-35%  . Pericarditis    history of acute pericarditis in July 2010 now resolved  . Polymorphic ventricular tachycardia (HCC)    with VF arrest, s/p subsequent ICD  . S/P CABG (coronary artery bypass graft) Sept 2012    Past Surgical History:  Procedure Laterality Date  . ANKLE SURGERY    . CARDIAC CATHETERIZATION  08/26/2008  Est. EF of 45% -- Successful intracoronary stenting of the mid-LAD -- Three-vessel atherosclerotic coronary artery disease.  The patient has occlusion of the mid LAD, which is his culprit lesion.  He has  moderate disease in the proximal circumflex and PDA -- Moderate left ventricular dysfunction -- [name], [name]  . CARDIAC CATHETERIZATION  03/16/2009    EF was approximately 30% -- Atherosclerotic coronary vascular disease, three-vessel --  Moderate-to-severe ischemic  cardiomyopathy -- No clear evidence for an acute culprit lesion that would explain the patient's chest pain -- [name], [name].   . CORONARY ARTERY BYPASS GRAFT  05/07/2011   CABG x3:  LIMA to LAD, SVG to OM2, SVG to PDA  . ICD implant  02/23/2009    with subsequent extraction for MSSA bacteremia  . ICD reimplantation  05/09/2009   status post implant of a Medtronic Maximo II DR dual- chamber cardioverter defibrillator by Dr. Hillis Range  . IMPLANTABLE CARDIOVERTER DEFIBRILLATOR REVISION  03/05/13   RV lead fracture.  A new [employer] [id number] lead was placed  . LEAD REVISION Left 03/05/2013   Procedure: LEAD REVISION;  Surgeon: Hillis Range, MD;  Location: [facility] CATH LAB;  Service: Cardiovascular;  Laterality: Left;     Home Medications:  Prior to Admission medications   Medication Sig Start Date End Date Taking? Authorizing Provider  aspirin EC 81 MG tablet Take 1 tablet (81 mg total) by mouth daily. 02/07/17   Swaziland, Peter M, MD  atorvastatin (LIPITOR) 80 MG tablet TAKE 1 TABLET BY MOUTH AT BEDTIME 05/02/17   [place]il[place], Peter M, MD  carvedilol (COREG) 25 MG tablet TAKE 1 TABLET BY MOUTH TWICE DAILY WITH A MEAL 05/02/17   [place]il[place], Peter M, MD  nitroGLYCERIN (NITROSTAT) 0.4 MG SL tablet Place 1 tablet (0.4 mg total) under the tongue every 5 (five) minutes x 3 doses as needed for chest pain. IF NEEDED 12/08/13   [place]aziland, Peter M, MD  ramipril (ALTACE) 10 MG capsule TAKE 1 CAPSULE BY MOUTH TWICE DAILY 04/14/17   Gypsy Balsam K, NP  spironolactone (ALDACTONE) 25 MG tablet Take 1 tablet (25 mg total) by mouth daily. 04/14/17   Swaziland, Peter M, MD    Inpatient Medications: Scheduled Meds: . aspirin  81 mg Oral Daily  . atorvastatin  80 mg Oral QHS  . carvedilol  25 mg Oral BID WC  . ramipril  10 mg Oral BID  . spironolactone  25 mg Oral Daily   Continuous Infusions: . heparin 800 Units/hr (10/15/17 0120)   PRN Meds: acetaminophen **OR** acetaminophen, ondansetron (ZOFRAN) IV, ondansetron  **OR** ondansetron (ZOFRAN) IV  Allergies:    Allergies  Allergen Reactions  . Morphine And Related Itching    Social History:   Social History   Socioeconomic History  . Marital status: Married    Spouse name: Not on file  . Number of children: 3  . Years of education: Not on file  . Highest education level: Not on file  Social Needs  . Financial resource strain: Not on file  . Food insecurity - worry: Not on file  . Food insecurity - inability: Not on file  . Transportation needs - medical: Not on file  . Transportation needs - non-medical: Not on file  Occupational History  . Occupation: Multimedia programmer  Tobacco Use  . Smoking status: Current Every Day Smoker    Years: 37.00    Types: Cigarettes  . Smokeless tobacco: Never Used  . Tobacco comment:  1 pack per week. Has been smoking for 35 years.   Substance and Sexual Activity  . Alcohol use: No  . Drug use: No  . Sexual activity: Not on file  Other Topics Concern  . Not on file  Social History Narrative   SOCIAL HISTORY:  He is married for 37 years with children.  He smokes 1 pack per day. He has been smoking for 37 years.  He denies alcohol use.  He works as a  Curator.         FAMILY HISTORY:  Father has a history of coronary artery disease. Mother died in her 21s with a stroke.  He has 2 sisters who are alive and well.     Family History:    Family History  Problem Relation Age of Onset  . Stroke Mother   . Coronary artery disease Father      ROS:  Please see the history of present illness.  Review of Systems  Constitution: Negative.  HENT: Negative.   Cardiovascular: Positive for chest pain and dyspnea on exertion.  Respiratory: Positive for shortness of breath.   Endocrine: Negative.   Hematologic/Lymphatic: Negative.   Musculoskeletal: Negative.   Gastrointestinal: Negative.   Genitourinary: Negative.   Neurological: Negative.     All other ROS reviewed and negative.     Physical  Exam/Data:   Vitals:   10/15/17 0800 10/15/17 0818 10/15/17 0830 10/15/17 0909  BP: 132/76 132/76 128/74 126/77  Pulse: 60 60 60 61  Resp: 17 18 18 16   Temp:      TempSrc:      SpO2: 98% 98% 98% 98%  Weight:      Height:        Intake/Output Summary (Last 24 hours) at 10/15/2017 1018 Last data filed at 10/15/2017 0111 Gross per 24 hour  Intake 0.95 ml  Output -  Net 0.95 ml   Filed Weights   10/14/17 2200  Weight: 165 lb (74.8 kg)   Body mass index is 25.84 kg/m.  General:  Well nourished, well developed,chest pain & VT HEENT: normal Lymph: no adenopathy Neck: no JVD Endocrine:  No thryomegaly Vascular: No carotid bruits; FA pulses 2+ bilaterally without bruits  Cardiac:  normal S1, S2; RRR;1/6 sys murmur LSB Lungs:  Decreased breath sounds with crackles and  Wheezing-left lung base    Abd: soft, nontender, no hepatomegaly  Ext: no edema Musculoskeletal:  No deformities, BUE and BLE strength normal and equal Skin: warm and dry  Neuro:  CNs 2-12 intact, no focal abnormalities noted Psych:  Normal affect   EKG:  The EKG was personally reviewed and demonstrates:  Atrial paced ST elevation inf with reciprocal TWI  Telemetry:  Telemetry was personally reviewed and demonstrates:   At least 24 beat VT  Relevant CV Studies:   5\' 6"  (1.676 m) 159 lb (72.1 kg) 25.7  Study Highlights  myoview 02/2017    The left ventricular ejection fraction is moderately decreased (30-44%).  Nuclear stress EF: 34%.  There was no ST segment deviation noted during stress.  Findings consistent with prior myocardial infarction.  This is an intermediate risk study.   1. EF 34% with peri-apical dyskinesis.  2. Fixed large, severe mid to apical inferior, inferoseptal, anteroseptal, and anterior perfusion defect.  This suggests infarction without significant ischemia.    Intermediate risk study.     Echo 01/2017 Study Conclusions   - Left ventricle: The cavity size was normal. Wall  thickness was  normal. Systolic function was mildly to moderately reduced. The   estimated ejection fraction was in the range of 40% to 45%.   Akinesis of the mid-apicalinferoseptal myocardium. Features are   consistent with a pseudonormal left ventricular filling pattern,   with concomitant abnormal relaxation and increased filling   pressure (grade 2 diastolic dysfunction). - Aortic valve: Mildly calcified annulus. Mildly thickened, mildly   calcified leaflets. - Right ventricle: Systolic function was moderately reduced.    Laboratory Data:  Chemistry Recent Labs  Lab 10/14/17 2224  NA 138  K 4.0  CL 103  CO2 27  GLUCOSE 100*  BUN 20  CREATININE 0.97  CALCIUM 9.2  GFRNONAA >60  GFRAA >60  ANIONGAP 8    No results for input(s): PROT, ALBUMIN, AST, ALT, ALKPHOS, BILITOT in the last 168 hours. Hematology Recent Labs  Lab 10/14/17 2224  WBC 11.1*  RBC 4.39  HGB 14.0  HCT 42.7  MCV 97.3  MCH 31.9  MCHC 32.8  RDW 12.8  PLT 235   Cardiac Enzymes Recent Labs  Lab 10/14/17 2224 10/15/17 0127 10/15/17 0738  TROPONINI <0.03 0.60* 10.06*   No results for input(s): TROPIPOC in the last 168 hours.  BNPNo results for input(s): BNP, PROBNP in the last 168 hours.  DDimer No results for input(s): DDIMER in the last 168 hours.  Radiology/Studies:  Dg Chest 2 View  Result Date: 10/14/2017 CLINICAL DATA:  Mid to left-sided chest pain. EXAM: CHEST  2 VIEW COMPARISON:  Radiographs 03/06/2013 FINDINGS: Right-sided pacemaker in place. Post median sternotomy. Coronary artery calcifications or stents. Mild cardiomegaly is unchanged from prior exam. Unchanged mediastinal contours with aortic atherosclerosis. Central bronchial thickening which is new. There is chronic hyperinflation. No consolidation. No pleural fluid or pneumothorax. No acute osseous abnormalities. IMPRESSION: 1. Bronchial thickening, new from prior exam. 2. Chronic hyperinflation. Post CABG. Right-sided pacemaker  in place. Electronically Signed   By: Rubye OaksMelanie  Ehinger [name].   On: 10/14/2017 23:14    Assessment and Plan:   1.STEMI with ventricular tachycardia troponins 10 ongoing chest pain. [facility].I have reviewed the risks, indications, and alternatives to angioplasty and stenting with the patient. Risks include but are not limited to bleeding, infection, vascular injury, stroke, myocardial infection, arrhythmia, kidney injury, radiation-related injury in the case of prolonged fluoroscopy use, emergency cardiac surgery, and death. The patient understands the risks of serious complication is low (<1%) and he agrees to proceed.   CAD S/P ant STEMI 2000 s/p DES LAD. One week later out of hospital cardiac arrest with V.fib, repeat cath 50% in stent restenosis treated with PTCA.ICD placed. CABG x3 2012 LIMA-LAD, SVG-OM, SVG-RCA. No ischemia on myoview 02/2017  ICD complicated by staph bacteremia and pericarditis had to be replaced.followed by Dr. Johney FrameAllred  ICM EF 35% improved to 40-45% on echo 01/2017. Recent optivol readings elevated but patient asymptomatic-eats fast food  Tobacco abuse 1/2ppd  HTN controlled  HLD  For questions or updates, please contact [facility] Please consult [email or web address] for contact info under Cardiology/STEMI.   Elson ClanSigned, Michele Lenze, PA-C 10/15/2017 10:18 AM   Attending note:  Records reviewed and case discussed with Ms. Geni BersLenze PA-C.  I agree with her assessment. [name] presents with chest pain concerning for unstable angina in the setting of known CAD with previous anterior STEMI and DES LAD intervention as well as subsequent angioplasty in 2000 followed by CABG in 2012 as discussed above.  He had a negative ischemic workup in July  of last year, also ischemic cardiomyopathy with LVEF 40-45%.  and ICD in place.  He reports no recent palpitations, device discharges, or syncope.  Overall hemodynamically stable in the ER with heart rate in the 60s  and systolic blood pressure 120s.  Chest x-ray showed bronchial thickening with no infiltrates.  Lab work showed potassium 4.0, BUN 20, creatinine 0.97, troponin I increasing from less than 0.03 up to 0.60 and then 10.06.  Hemoglobin 14 and platelets 235.  I personally reviewed his ECG which showed an atrial paced rhythm with predominantly anterolateral ST-T wave abnormalities.  NSTEMI, planning urgent transfer to [facility] for cardiac catheterization and possible PCI.  Risks and benefits discussed and he is in agreement to proceed.  Continue IV heparin and nitroglycerin.  Jonelle Sidle, [name]., F.A.C.C.

## 2017-10-15 NOTE — ED Notes (Signed)
PT continues to have some pain.

## 2017-10-15 NOTE — ED Notes (Signed)
CRITICAL VALUE ALERT  Critical Value:  Troponin 10.06  Date & Time Notied:  10/15/17 at 0825  Provider Notified: Dr.  Ardyth HarpsHernandez  Orders Received/Actions taken:

## 2017-10-15 NOTE — H&P (Signed)
History and Physical    [name]:[id number] DOB: 1955-09-15 DOA: 10/14/2017  Referring MD/NP/PA: Glynn Octave , EDP PCP: Adrian Prince, MD  Patient coming from: Home  Chief Complaint: Chest pain  HPI: [name] is a 62 y.o. male who has a history of coronary artery disease status post anterior ST elevated MI in 2000 with a drug-eluting stent to the LAD.  1 week later came back to the hospital with an out of hospital cardiac arrest with V. fib, repeat cath at that time showed a 50% in-stent restenosis treated with balloon angioplasty and ICD was placed.  Unfortunately he had staph bacteremia that required ICD replacement.  In 2012 had a three-vessel CABG.  His last follow-up with cardiology was in July 2018 at which time he had a Myoview that did not demonstrate ischemia.  Yesterday he woke up with "anxiety" that lasted about 15 minutes, denied chest pain.  He does continue to smoke.  His initial troponin was negative, EKG shows a paced rhythm without acute ischemic changes.  He subsequently developed chest pain and was started on IV nitroglycerin as well as IV heparin.  Due to high hospital census overnight admitter was unable to see patient upon initial call from the ED.  At about 8 AM I received a call from the ED that his troponin had risen to 0.6 and he had an approximate 20-30 beat run of VT on the monitor.  Had no ICD firing.  Subsequently his troponin has risen to greater than 10.  He continues to have 3-4 out of 10 chest pain despite nitroglycerin drip.  At this point I have contacted cardiology who has already seen patient and is planning for transfer straight to the Cath Lab at [facility].  Cardiology will be assuming care of this patient upon arrival to [facility].  Past Medical/Surgical History: Past Medical History:  Diagnosis Date  . Acute anterior myocardial infarction (HCC) 09/02/2008   post emergent stenting of the LAD in January 2010 -- anterior ST-elevation  myocardial infarction  . Adrenal mass (HCC)    currently being evaluated -- 4.1 cm right adrenal mass  . Anemia   . CAD (coronary artery disease)    Est. EF of 45% -- Successful intracoronary stenting of the mid-LAD -- Three-vessel atherosclerotic coronary artery disease.  The patient has occlusion of the mid LAD, which is his culprit lesion.  He has  moderate disease in the proximal circumflex and PDA -- Moderate left ventricular dysfunction -- [name], [name]  . Cardiac arrest - ventricular fibrillation 09/02/2008   Out of hospital cardiac arrest secondary to arrhythmia  . CHF (congestive heart failure) (HCC)   . CHF NYHA class II (HCC)     New York Heart Association class II/III heart failure  . Ejection fraction < 50%    30-35%  . History of methicillin resistant staphylococcus aureus (MRSA)    History of methicillin-sensitive Staphylococcus aureus bacteremia  with implantable cardioverter-defibrillator explant, February 23, 2009  . HL (hearing loss)   . Hyperlipidemia   . Hypertension   . Ischemic cardiomyopathy    severe ischemic cardiomyopathy -- ejection fraction 30-35%  . Pericarditis    history of acute pericarditis in July 2010 now resolved  . Polymorphic ventricular tachycardia (HCC)    with VF arrest, s/p subsequent ICD  . S/P CABG (coronary artery bypass graft) Sept 2012    Past Surgical History:  Procedure Laterality Date  . ANKLE SURGERY    .  CARDIAC CATHETERIZATION  08/26/2008   Est. EF of 45% -- Successful intracoronary stenting of the mid-LAD -- Three-vessel atherosclerotic coronary artery disease.  The patient has occlusion of the mid LAD, which is his culprit lesion.  He has  moderate disease in the proximal circumflex and PDA -- Moderate left ventricular dysfunction -- [name], [name]  . CARDIAC CATHETERIZATION  03/16/2009    EF was approximately 30% -- Atherosclerotic coronary vascular disease, three-vessel --  Moderate-to-severe ischemic cardiomyopathy --  No clear evidence for an acute culprit lesion that would explain the patient's chest pain -- [name], [name].   . CORONARY ARTERY BYPASS GRAFT  05/07/2011   CABG x3:  LIMA to LAD, SVG to OM2, SVG to PDA  . ICD implant  02/23/2009    with subsequent extraction for MSSA bacteremia  . ICD reimplantation  05/09/2009   status post implant of a Medtronic Maximo II DR dual- chamber cardioverter defibrillator by Dr. Hillis Range  . IMPLANTABLE CARDIOVERTER DEFIBRILLATOR REVISION  03/05/13   RV lead fracture.  A new [employer] [id number] lead was placed  . LEAD REVISION Left 03/05/2013   Procedure: LEAD REVISION;  Surgeon: Hillis Range, MD;  Location: [facility] CATH LAB;  Service: Cardiovascular;  Laterality: Left;    Social History:  reports that he has been smoking cigarettes.  He has smoked for the past 37.00 years. he has never used smokeless tobacco. He reports that he does not drink alcohol or use drugs.  Allergies: Allergies  Allergen Reactions  . Morphine And Related Itching    Family History:  Family History  Problem Relation Age of Onset  . Stroke Mother   . Coronary artery disease Father     Prior to Admission medications   Medication Sig Start Date End Date Taking? Authorizing Provider  aspirin EC 81 MG tablet Take 1 tablet (81 mg total) by mouth daily. 02/07/17   [place], Peter M, MD  atorvastatin (LIPITOR) 80 MG tablet TAKE 1 TABLET BY MOUTH AT BEDTIME 05/02/17   [place], Peter M, MD  carvedilol (COREG) 25 MG tablet TAKE 1 TABLET BY MOUTH TWICE DAILY WITH A MEAL 05/02/17   [place], Peter M, MD  nitroGLYCERIN (NITROSTAT) 0.4 MG SL tablet Place 1 tablet (0.4 mg total) under the tongue every 5 (five) minutes x 3 doses as needed for chest pain. IF NEEDED 12/08/13   [place], Peter M, MD  ramipril (ALTACE) 10 MG capsule TAKE 1 CAPSULE BY MOUTH TWICE DAILY 04/14/17   Gypsy Balsam K, NP  spironolactone (ALDACTONE) 25 MG tablet Take 1 tablet (25 mg total) by mouth daily. 04/14/17   [place], Peter M,  MD    Review of Systems:  Constitutional: Denies fever, chills, diaphoresis, appetite change and fatigue.  HEENT: Denies photophobia, eye pain, redness, hearing loss, ear pain, congestion, sore throat, rhinorrhea, sneezing, mouth sores, trouble swallowing, neck pain, neck stiffness and tinnitus.   Respiratory: Denies SOB, DOE, cough, chest tightness,  and wheezing.   Cardiovascular: Denies  palpitations and leg swelling.  Gastrointestinal: Denies nausea, vomiting, abdominal pain, diarrhea, constipation, blood in stool and abdominal distention.  Genitourinary: Denies dysuria, urgency, frequency, hematuria, flank pain and difficulty urinating.  Endocrine: Denies: hot or cold intolerance, sweats, changes in hair or nails, polyuria, polydipsia. Musculoskeletal: Denies myalgias, back pain, joint swelling, arthralgias and gait problem.  Skin: Denies pallor, rash and wound.  Neurological: Denies dizziness, seizures, syncope, weakness, light-headedness, numbness and headaches.  Hematological: Denies adenopathy. Easy bruising, personal or family  bleeding history  Psychiatric/Behavioral: Denies suicidal ideation, mood changes, confusion, nervousness, sleep disturbance and agitation    Physical Exam: Vitals:   10/15/17 0750 10/15/17 0800 10/15/17 0818 10/15/17 0830  BP:  132/76 132/76 128/74  Pulse: 75 60 60 60  Resp: 16 17 18 18   Temp:      TempSrc:      SpO2: 100% 98% 98% 98%  Weight:      Height:         Constitutional: NAD, calm, comfortable Eyes: PERRL, lids and conjunctivae normal ENMT: Mucous membranes are moist. Posterior pharynx clear of any exudate or lesions.Normal dentition.  Neck: normal, supple, no masses, no thyromegaly Respiratory: clear to auscultation bilaterally, no wheezing, no crackles. Normal respiratory effort. No accessory muscle use.  Cardiovascular: Regular rate and rhythm, no murmurs / rubs / gallops. No extremity edema. 2+ pedal pulses. No carotid bruits.   Abdomen: no tenderness, no masses palpated. No hepatosplenomegaly. Bowel sounds positive.  Musculoskeletal: no clubbing / cyanosis. No joint deformity upper and lower extremities. Good ROM, no contractures. Normal muscle tone.  Skin: no rashes, lesions, ulcers. No induration Neurologic: CN 2-12 grossly intact. Sensation intact, DTR normal. Strength 5/5 in all 4.  Psychiatric: Normal judgment and insight. Alert and oriented x 3. Normal mood.    Labs on Admission: I have personally reviewed the following labs and imaging studies  CBC: Recent Labs  Lab 10/14/17 2224  WBC 11.1*  HGB 14.0  HCT 42.7  MCV 97.3  PLT 235   Basic Metabolic Panel: Recent Labs  Lab 10/14/17 2224  NA 138  K 4.0  CL 103  CO2 27  GLUCOSE 100*  BUN 20  CREATININE 0.97  CALCIUM 9.2   GFR: Estimated Creatinine Clearance: 76.7 mL/min (by C-G formula based on SCr of 0.97 mg/dL). Liver Function Tests: No results for input(s): AST, ALT, ALKPHOS, BILITOT, PROT, ALBUMIN in the last 168 hours. No results for input(s): LIPASE, AMYLASE in the last 168 hours. No results for input(s): AMMONIA in the last 168 hours. Coagulation Profile: Recent Labs  Lab 10/14/17 2224  INR 0.96   Cardiac Enzymes: Recent Labs  Lab 10/14/17 2224 10/15/17 0127 10/15/17 0738  TROPONINI <0.03 0.60* 10.06*   BNP (last 3 results) No results for input(s): PROBNP in the last 8760 hours. HbA1C: No results for input(s): HGBA1C in the last 72 hours. CBG: No results for input(s): GLUCAP in the last 168 hours. Lipid Profile: No results for input(s): CHOL, HDL, LDLCALC, TRIG, CHOLHDL, LDLDIRECT in the last 72 hours. Thyroid Function Tests: No results for input(s): TSH, T4TOTAL, FREET4, T3FREE, THYROIDAB in the last 72 hours. Anemia Panel: No results for input(s): VITAMINB12, FOLATE, FERRITIN, TIBC, IRON, RETICCTPCT in the last 72 hours. Urine analysis:    Component Value Date/Time   COLORURINE YELLOW 05/03/2011 1618    APPEARANCEUR CLEAR 05/03/2011 1618   LABSPEC 1.017 05/03/2011 1618   PHURINE 7.5 05/03/2011 1618   GLUCOSEU NEGATIVE 05/03/2011 1618   HGBUR NEGATIVE 05/03/2011 1618   BILIRUBINUR NEGATIVE 05/03/2011 1618   KETONESUR NEGATIVE 05/03/2011 1618   PROTEINUR NEGATIVE 05/03/2011 1618   UROBILINOGEN 1.0 05/03/2011 1618   NITRITE NEGATIVE 05/03/2011 1618   LEUKOCYTESUR NEGATIVE 05/03/2011 1618   Sepsis Labs: @LABRCNTIP (procalcitonin:4,lacticidven:4) )No results found for this or any previous visit (from the past 240 hour(s)).   Radiological Exams on Admission: Dg Chest 2 View  Result Date: 10/14/2017 CLINICAL DATA:  Mid to left-sided chest pain. EXAM: CHEST  2 VIEW COMPARISON:  Radiographs 03/06/2013  FINDINGS: Right-sided pacemaker in place. Post median sternotomy. Coronary artery calcifications or stents. Mild cardiomegaly is unchanged from prior exam. Unchanged mediastinal contours with aortic atherosclerosis. Central bronchial thickening which is new. There is chronic hyperinflation. No consolidation. No pleural fluid or pneumothorax. No acute osseous abnormalities. IMPRESSION: 1. Bronchial thickening, new from prior exam. 2. Chronic hyperinflation. Post CABG. Right-sided pacemaker in place. Electronically Signed   By: Rubye Oaks [name].   On: 10/14/2017 23:14    EKG: Independently reviewed.  Atrial paced rhythm, no acute changes.  EKG from 2/20 at 8:37 AM now shows some minimal upsloping/questionable ST changes in the inferior lateral leads.  Assessment/Plan Principal Problem:   NSTEMI (non-ST elevated myocardial infarction) (HCC)    Non-ST elevated MI -Already seen by cardiology with plans for emergent transfer to the [facility] cardiac Cath Lab. -Continue heparin, IV nitroglycerin drip currently at 10 mics, advised nurse to titrate as needed for chest pain. -Continue aspirin. -Resume home meds including statin, Coreg, ramipril, spironolactone. -We will sign of care to  cardiology.   DVT prophylaxis: IV heparin Code Status: Full code Family Communication:  wife at bedside updated on plan of care and questions answered Disposition Plan: Emergent transfer to [facility] for cardiac catheterization Consults called: Cardiology Admission status: Inpatient   Time Spent: 95 minutes  Estela Philip Aspen MD [facility] Pager [phone]  If 7PM-7AM, please contact night-coverage [email or web address] Password [facility]  10/15/2017, 9:24 AM

## 2017-10-16 ENCOUNTER — Encounter (HOSPITAL_COMMUNITY): Payer: Self-pay | Admitting: General Practice

## 2017-10-16 ENCOUNTER — Inpatient Hospital Stay (HOSPITAL_COMMUNITY): Payer: Medicare Other

## 2017-10-16 DIAGNOSIS — I255 Ischemic cardiomyopathy: Secondary | ICD-10-CM

## 2017-10-16 LAB — BASIC METABOLIC PANEL
Anion gap: 10 (ref 5–15)
BUN: 10 mg/dL (ref 6–20)
CHLORIDE: 105 mmol/L (ref 101–111)
CO2: 20 mmol/L — AB (ref 22–32)
CREATININE: 0.78 mg/dL (ref 0.61–1.24)
Calcium: 8.9 mg/dL (ref 8.9–10.3)
GFR calc Af Amer: >60 mL/min (ref >60)
GFR calc non Af Amer: >60 mL/min (ref >60)
Glucose, Bld: 99 mg/dL (ref 65–99)
POTASSIUM: 3.8 mmol/L (ref 3.5–5.1)
SODIUM: 135 mmol/L (ref 135–145)

## 2017-10-16 LAB — ECHOCARDIOGRAM COMPLETE
HEIGHTINCHES: 67 in
Weight: 2645.52 oz

## 2017-10-16 LAB — CBC
HEMATOCRIT: 43.1 % (ref 39.0–52.0)
Hemoglobin: 14.7 g/dL (ref 13.0–17.0)
MCH: 32.9 pg (ref 26.0–34.0)
MCHC: 34.1 g/dL (ref 30.0–36.0)
MCV: 96.4 fL (ref 78.0–100.0)
PLATELETS: 244 10*3/uL (ref 150–400)
RBC: 4.47 MIL/uL (ref 4.22–5.81)
RDW: 13.1 % (ref 11.5–15.5)
WBC: 13.2 10*3/uL — AB (ref 4.0–10.5)

## 2017-10-16 LAB — HEPATIC FUNCTION PANEL
ALT: 29 U/L (ref 17–63)
AST: 127 U/L — AB (ref 15–41)
Albumin: 3.4 g/dL — ABNORMAL LOW (ref 3.5–5.0)
Alkaline Phosphatase: 98 U/L (ref 38–126)
BILIRUBIN DIRECT: 0.1 mg/dL (ref 0.1–0.5)
BILIRUBIN INDIRECT: 1 mg/dL — AB (ref 0.3–0.9)
BILIRUBIN TOTAL: 1.1 mg/dL (ref 0.3–1.2)
Total Protein: 6.2 g/dL — ABNORMAL LOW (ref 6.5–8.1)

## 2017-10-16 MED ORDER — ANGIOPLASTY BOOK
Freq: Once | Status: AC
  Administered 2017-10-16: 06:00:00
  Filled 2017-10-16: qty 1

## 2017-10-16 MED ORDER — HEART ATTACK BOUNCING BOOK
Freq: Once | Status: AC
  Administered 2017-10-16: 06:00:00
  Filled 2017-10-16: qty 1

## 2017-10-16 MED ORDER — INFLUENZA VAC SPLIT QUAD 0.5 ML IM SUSY
0.5000 mL | PREFILLED_SYRINGE | INTRAMUSCULAR | Status: AC
  Administered 2017-10-17: 0.5 mL via INTRAMUSCULAR
  Filled 2017-10-16: qty 0.5

## 2017-10-16 MED ORDER — MAGNESIUM OXIDE 400 (241.3 MG) MG PO TABS
800.0000 mg | ORAL_TABLET | Freq: Once | ORAL | Status: AC
  Administered 2017-10-16: 800 mg via ORAL
  Filled 2017-10-16: qty 2

## 2017-10-16 MED ORDER — STUDY - AEGIS II STUDY - PLACEBO OR CSL112 (PI-HILTY)
170.0000 mL | INTRAVENOUS | Status: DC
  Administered 2017-10-16: 10:00:00 170 mL via INTRAVENOUS
  Filled 2017-10-16: qty 170

## 2017-10-16 MED FILL — Heparin Sodium (Porcine) 2 Unit/ML in Sodium Chloride 0.9%: INTRAMUSCULAR | Qty: 1000 | Status: AC

## 2017-10-16 NOTE — Progress Notes (Signed)
Progress Note  Patient Name: Billy JacquetRobert E Bruney Date of Encounter: 10/16/2017  Primary Cardiologist: Peter SwazilandJordan, MD   Subjective   No chest pain.  No SOB.   Inpatient Medications    Scheduled Meds: . AEGIS II (PI-Hilty)  170 mL Intravenous Q7 days  . aspirin  81 mg Oral Daily  . atorvastatin  80 mg Oral QHS  . carvedilol  25 mg Oral BID WC  . heparin  5,000 Units Subcutaneous Q8H  . [START ON 10/17/2017] Influenza vac split quadrivalent PF  0.5 mL Intramuscular Tomorrow-1000  . ramipril  10 mg Oral BID  . sodium chloride flush  3 mL Intravenous Q12H  . spironolactone  25 mg Oral Daily  . ticagrelor  90 mg Oral BID   Continuous Infusions: . sodium chloride     PRN Meds: sodium chloride, acetaminophen, ALPRAZolam, ondansetron (ZOFRAN) IV, sodium chloride flush   Vital Signs    Vitals:   10/15/17 2100 10/16/17 0619 10/16/17 0700 10/16/17 0846  BP: 120/82 117/67 132/79 125/90  Pulse:  67 71   Resp: 18 15 19    Temp:  97.7 F (36.5 C) (!) 97.5 F (36.4 C)   TempSrc:  Oral Oral   SpO2:  99% 99%   Weight:  165 lb 5.5 oz (75 kg)    Height:        Intake/Output Summary (Last 24 hours) at 10/16/2017 0950 Last data filed at 10/16/2017 0120 Gross per 24 hour  Intake 480 ml  Output 250 ml  Net 230 ml   Filed Weights   10/14/17 2200 10/16/17 0619  Weight: 165 lb (74.8 kg) 165 lb 5.5 oz (75 kg)    Telemetry    Atrial pacing with intermittent NSVT - Personally Reviewed  ECG    Atrial pacing 65 bpm with TWI V2, V3-6 - Personally Reviewed  Physical Exam   GEN: No acute distress.   Neck: No JVD Cardiac: RRR, no murmurs, rubs, or gallops.  Respiratory: Clear to auscultation bilaterally. GI: Soft, nontender, non-distended  MS: No edema; No deformity.  Right radial without bruising or bleeding.  Neuro:  Nonfocal  Psych: Normal affect   Labs    Chemistry Recent Labs  Lab 10/14/17 2224 10/15/17 1454 10/16/17 0235  NA 138  --  135  K 4.0  --  3.8  CL 103   --  105  CO2 27  --  20*  GLUCOSE 100*  --  99  BUN 20  --  10  CREATININE 0.97 0.81 0.78  CALCIUM 9.2  --  8.9  PROT  --   --  6.2*  ALBUMIN  --   --  3.4*  AST  --   --  127*  ALT  --   --  29  ALKPHOS  --   --  98  BILITOT  --   --  1.1  GFRNONAA >60 >60 >60  GFRAA >60 >60 >60  ANIONGAP 8  --  10     Hematology Recent Labs  Lab 10/14/17 2224 10/16/17 0235  WBC 11.1* 13.2*  RBC 4.39 4.47  HGB 14.0 14.7  HCT 42.7 43.1  MCV 97.3 96.4  MCH 31.9 32.9  MCHC 32.8 34.1  RDW 12.8 13.1  PLT 235 244    Cardiac Enzymes Recent Labs  Lab 10/14/17 2224 10/15/17 0127 10/15/17 0738  TROPONINI <0.03 0.60* 10.06*   No results for input(s): TROPIPOC in the last 168 hours.   BNPNo results for input(s):  BNP, PROBNP in the last 168 hours.   DDimer No results for input(s): DDIMER in the last 168 hours.   Radiology    Dg Chest 2 View  Result Date: 10/14/2017 CLINICAL DATA:  Mid to left-sided chest pain. EXAM: CHEST  2 VIEW COMPARISON:  Radiographs 03/06/2013 FINDINGS: Right-sided pacemaker in place. Post median sternotomy. Coronary artery calcifications or stents. Mild cardiomegaly is unchanged from prior exam. Unchanged mediastinal contours with aortic atherosclerosis. Central bronchial thickening which is new. There is chronic hyperinflation. No consolidation. No pleural fluid or pneumothorax. No acute osseous abnormalities. IMPRESSION: 1. Bronchial thickening, new from prior exam. 2. Chronic hyperinflation. Post CABG. Right-sided pacemaker in place. Electronically Signed   By: Rubye Oaks [name].   On: 10/14/2017 23:14    Cardiac Studies   CORONARY STENT INTERVENTION  LEFT HEART CATH AND CORS/GRAFTS ANGIOGRAPHY  Conclusion     Mid RCA lesion is 100% stenosed.  Prox Cx lesion is 75% stenosed.  A drug-eluting stent was successfully placed using a STENT SYNERGY DES 3X12.  Post intervention, there is a 0% residual stenosis.  Ost Cx to Prox Cx lesion is 45%  stenosed.  Ost 1st Mrg lesion is 90% stenosed.  1st Mrg lesion is 80% stenosed.  A drug-eluting stent was successfully placed using a STENT SYNERGY DES 2.25X24.  Post intervention, there is a 0% residual stenosis.  Prox LAD to Mid LAD lesion is 70% stenosed.  Dist LAD lesion is 90% stenosed.  LIMA graft was visualized by angiography and is normal in caliber.  The graft exhibits no disease.  SVG graft was visualized by angiography and is normal in caliber.  The graft exhibits no disease.  SVG graft was visualized by angiography and is normal in caliber.  The graft exhibits no disease.  There is moderate to severe left ventricular systolic dysfunction.  LV end diastolic pressure is normal.  1. Severe 3 vessel obstructive CAD 2. Patent LIMA to the LAD 3. Patent SVG to the second OM 4. Patent SVG to the PDA. 5. Moderate to severe LV dysfunction. EF estimated at 35% 6. Normal LVEDP 7. Successful stenting of the first OM and proximal LCx with DES x 2  Plan: the first OM was felt to be the culprit lesion. It appeared hazy. There is severe stenosis in the distal LAD but this LV distribution appears akinetic/scarred. Will continue DAPT for one year. Risk factor modification including smoking cessation.     Diagnostic Diagram       Post-Intervention Diagram         Patient Profile     62 y.o. male with hx of CAD S/P anterior STEMI in 2000 w/ DES to LAD, 1 week later cardiac arrest with V fib and 50% in stent restenosis treated with balloon angioplasty and ICD placed, CABG in 2012, normal myoview 02/2017, CHF, HTN, HLD.  Assessment & Plan    STEMI with ventricular tachycardia, troponin 10. Transferred to [facility] for urgent cath. LHC revealed patent LIMA to LAD, patent SVG to second OM, patent SVG to PDA, moderate to severe LV dysfunction with EF 35%, normal LVEDP. The first OM was felt to be culprit lesion and DES X 2 were placed to the first OM and Left Circ. The  plan is to continue DAPT for one year and risk factor modification including smoking cessation.   CAD: Hx of anterior STEMI in 2000 w/ DES to LAD, 1 week later cardiac arrest with V fib and 50% in stent restenosis treated  with balloon angioplasty and ICD placed, CABG in 2012. On aspirin 81 mg, BB, ACE-I and statin. Stent placed as above. Brillinta 90 mg BID initiated.   Ischemic cardiomyopathy: Last echo in 02/2017 showed EF 40-45% with Akinesis of the mid-apicalinferoseptal myocardium. EF decreased per cath to 35%. Will recheck echo.   NSVT: Pt has history of Vfib arrest in 2000 and had Vtach on presentation. He has ICD present. Had 14 beat run last night and 8 beat run this am. Continue BB. Will check echo and have ICD interrogated.   Hypertension: Bp well controlled. Continues on home carvedilol 25 mg bid, ramipril 10 mg bid, spironolactone 25 mg daily.   Hyperlipidemia: On statin. LDL was 73 in 01/2017.    For questions or updates, please contact [facility] Please consult [email or web address] for contact info under Cardiology/STEMI.      Signed, Berton Bon, NP  10/16/2017, 9:50 AM    History and all data above reviewed.  Patient examined.  I agree with the findings as above.  He has no chest pain.  No SOB.  NSVT and frequent ectopy as above.  The patient exam reveals COR:RRR  ,  Lungs: Clear  ,  Abd: Positive bowel sounds, no rebound no guarding, Ext No edema, right wrist without bleeding or bruising  .  All available labs, radiology testing, previous records reviewed. Agree with documented assessment and plan. VT:  I would like to look at his EF before discharge.  Supplement magnesium.  Check ICD to see frequency and burden of Vtach.  Ambulate in hallway.    Fayrene Fearing [facility]  10:56 AM  10/16/2017

## 2017-10-16 NOTE — Discharge Summary (Signed)
Discharge Summary    Patient ID: [name],  MRN: [id number], DOB/AGE: 60-09-1955 60 y.o.  Admit date: 10/14/2017 Discharge date: 10/17/2017  Primary Care Provider: Adrian Prince Primary Cardiologist: Peter Swaziland, MD  Discharge Diagnoses    Principal Problem:   NSTEMI (non-ST elevated myocardial infarction) [facility])   Allergies Allergies  Allergen Reactions  . Morphine And Related Itching    Diagnostic Studies/Procedures    Echocardiogram 10/16/17 Study Conclusions - Left ventricle: The cavity size was normal. Wall thickness was increased in a pattern of mild LVH. Systolic function was severely reduced. The estimated ejection fraction was in the range of 25% to 30%. There is akinesis of the anteroseptal and apical myocardium. Features are consistent with a pseudonormal left ventricular filling pattern, with concomitant abnormal relaxation and increased filling pressure (grade 2 diastolic dysfunction). - Aortic valve: There was trivial regurgitation. - Mitral valve: Calcified annulus. - Left atrium: The atrium was mildly dilated.  Impressions: - Akinesis of the distal anteroseptal and apical walls with overall severely reduced LV systolic function; moderate diastolic dysfunction; trace AI; mild LAE.   CORONARY STENT INTERVENTION    05/15/18  LEFT HEART CATH AND CORS/GRAFTS ANGIOGRAPHY  Conclusion    Mid RCA lesion is 100% stenosed.  Prox Cx lesion is 75% stenosed.  A drug-eluting stent was successfully placed using a STENT SYNERGY DES 3X12.  Post intervention, there is a 0% residual stenosis.  Ost Cx to Prox Cx lesion is 45% stenosed.  Ost 1st Mrg lesion is 90% stenosed.  1st Mrg lesion is 80% stenosed.  A drug-eluting stent was successfully placed using a STENT SYNERGY DES 2.25X24.  Post intervention, there is a 0% residual stenosis.  Prox LAD to Mid LAD lesion is 70% stenosed.  Dist LAD lesion is 90% stenosed.  LIMA  graft was visualized by angiography and is normal in caliber.  The graft exhibits no disease.  SVG graft was visualized by angiography and is normal in caliber.  The graft exhibits no disease.  SVG graft was visualized by angiography and is normal in caliber.  The graft exhibits no disease.  There is moderate to severe left ventricular systolic dysfunction.  LV end diastolic pressure is normal.   1. Severe 3 vessel obstructive CAD 2. Patent LIMA to the LAD 3. Patent SVG to the second OM 4. Patent SVG to the PDA. 5. Moderate to severe LV dysfunction. EF estimated at 35% 6. Normal LVEDP 7. Successful stenting of the first OM and proximal LCx with DES x 2  Plan: the first OM was felt to be the culprit lesion. It appeared hazy. There is severe stenosis in the distal LAD but this LV distribution appears akinetic/scarred. Will continue DAPT for one year. Risk factor modification including smoking cessation.     Diagnostic Diagram       Post-Intervention Diagram           History of Present Illness     [name] is a 62 y.o. male who has a history of coronary artery disease status post anterior ST elevated MI in 2000 with a drug-eluting stent to the LAD.  1 week later came back to the hospital with an out of hospital cardiac arrest with V. fib, repeat cath at that time showed a 50% in-stent restenosis treated with balloon angioplasty and ICD was placed.  Unfortunately he had staph bacteremia that required ICD replacement.  In 2012 had a three-vessel CABG.  His last follow-up with cardiology  was in July 2018 at which time he had a Myoview that did not demonstrate ischemia.  Yesterday he woke up with "anxiety" that lasted about 15 minutes, denied chest pain.  He does continue to smoke.  His initial troponin was negative, EKG shows a paced rhythm without acute ischemic changes.  He subsequently developed chest pain and was started on IV nitroglycerin as well as IV heparin.  Due  to high hospital census overnight admitter was unable to see patient upon initial call from the ED.  At about 8 AM I received a call from the ED that his troponin had risen to 0.6 and he had an approximate 20-30 beat run of VT on the monitor.  Had no ICD firing.  Subsequently his troponin has risen to greater than 10.  He continues to have 3-4 out of 10 chest pain despite nitroglycerin drip.  At this point I have contacted cardiology who has already seen patient and is planning for transfer straight to the Cath Lab at [place].   Hospital Course     Consultants: None  Mr. Heffington was continued on IV heparin and Nitroglycerin and taken to the cath lab on 05/15/18 and found to have showed patent LIMA to LAD, patent SVG to second OM, patent SVG to PDA, moderate to severe LV dysfunction with EF 35%, normal LVEDP. The first OM was felt to be culprit lesion and DES X 2 were placed to the first OM and Left Circ. The plan is to continue DAPT for one year and risk factor modification including smoking cessation.   The patient had some short runs of NSVT overnight after cath. None since yesterday morning. ICD was interrogated and rate detection changes. Echo done yesterday shows reduced EF down to 25-30% post MI, down from 40-45%. Volume is stable. Pt is asymptomatic. Continue carvedilol 25 mg bid and spironolactone 25 mg daily. Will stop ACE-I and start Entresto 49/51 mg after 36 hour washout. Pharmacist has discussed with the patient.   The patient has continued to smoke. He now says that he is ready to quit. Will provide Chantix. Instructions given to pt to take Chantix 0.5 mg daily for 3 days then increase to twice daily for the next 4 days, then increase to 1 mg bid.   The patient is enrolled in a cholesterol study.   Patient has been seen by Dr. Antoine Poche today and deemed ready for discharge home. All follow up appointments have been scheduled. Discharge medications are listed  below. _____________  Discharge Vitals Blood pressure 122/73, pulse 62, temperature 98.1 F (36.7 C), temperature source Oral, resp. rate 18, height 5\' 7"  (1.702 m), weight 160 lb 15 oz (73 kg), SpO2 98 %.  Filed Weights   10/14/17 2200 10/16/17 0619 10/17/17 0431  Weight: 165 lb (74.8 kg) 165 lb 5.5 oz (75 kg) 160 lb 15 oz (73 kg)    Labs & Radiologic Studies    CBC Recent Labs    10/14/17 2224 10/16/17 0235  WBC 11.1* 13.2*  HGB 14.0 14.7  HCT 42.7 43.1  MCV 97.3 96.4  PLT 235 244   Basic Metabolic Panel Recent Labs    16/10/96 2224 10/15/17 0738 10/15/17 1454 10/16/17 0235  NA 138  --   --  135  K 4.0  --   --  3.8  CL 103  --   --  105  CO2 27  --   --  20*  GLUCOSE 100*  --   --  99  BUN 20  --   --  10  CREATININE 0.97  --  0.81 0.78  CALCIUM 9.2  --   --  8.9  MG  --  1.7  --   --    Liver Function Tests Recent Labs    10/16/17 0235  AST 127*  ALT 29  ALKPHOS 98  BILITOT 1.1  PROT 6.2*  ALBUMIN 3.4*   No results for input(s): LIPASE, AMYLASE in the last 72 hours. Cardiac Enzymes Recent Labs    10/14/17 2224 10/15/17 0127 10/15/17 0738  TROPONINI <0.03 0.60* 10.06*   BNP Invalid input(s): POCBNP D-Dimer No results for input(s): DDIMER in the last 72 hours. Hemoglobin A1C No results for input(s): HGBA1C in the last 72 hours. Fasting Lipid Panel No results for input(s): CHOL, HDL, LDLCALC, TRIG, CHOLHDL, LDLDIRECT in the last 72 hours. Thyroid Function Tests No results for input(s): TSH, T4TOTAL, T3FREE, THYROIDAB in the last 72 hours.  Invalid input(s): FREET3 _____________  Dg Chest 2 View  Result Date: 10/14/2017 CLINICAL DATA:  Mid to left-sided chest pain. EXAM: CHEST  2 VIEW COMPARISON:  Radiographs 03/06/2013 FINDINGS: Right-sided pacemaker in place. Post median sternotomy. Coronary artery calcifications or stents. Mild cardiomegaly is unchanged from prior exam. Unchanged mediastinal contours with aortic atherosclerosis. Central  bronchial thickening which is new. There is chronic hyperinflation. No consolidation. No pleural fluid or pneumothorax. No acute osseous abnormalities. IMPRESSION: 1. Bronchial thickening, new from prior exam. 2. Chronic hyperinflation. Post CABG. Right-sided pacemaker in place. Electronically Signed   By: Rubye Oaks [name].   On: 10/14/2017 23:14   Disposition   Pt is being discharged home today in good condition.  Follow-up Plans & Appointments    Follow-up Information    Jodelle Gross, NP Follow up.   Specialties:  Nurse Practitioner, Radiology, Cardiology Why:  Cardiology hospital follow up on march 5th at 8:00.  Contact information: [address] [place] [address] [phone]        Hillis Range, MD Follow up.   Specialty:  Cardiology Why:  Follow up for ICD with Dr. Johney Frame on February 27th at 2:45.  Contact information: [address] [place] [address] [phone]          Discharge Instructions    AMB Referral to Cardiac Rehabilitation - Phase II   Complete by:  As directed    Diagnosis:   NSTEMI Coronary Stents     Amb Referral to Cardiac Rehabilitation   Complete by:  As directed    Diagnosis:   NSTEMI Coronary Stents     Diet - low sodium heart healthy   Complete by:  As directed    Discharge instructions   Complete by:  As directed    PLEASE REMEMBER TO BRING ALL OF YOUR MEDICATIONS TO EACH OF YOUR FOLLOW-UP OFFICE VISITS.  PLEASE ATTEND ALL SCHEDULED FOLLOW-UP APPOINTMENTS.   Activity: Increase activity slowly as tolerated. You may shower, but no soaking baths (or swimming) for 1 week. No driving for 24 hours. No lifting over 5 lbs for 1 week. No sexual activity for 1 week.   You May Return to Work: in 1 week (if applicable)  Wound Care: You may wash cath site gently with soap and water. Keep cath site clean and dry. If you notice pain, swelling, bleeding or pus at your cath site, please call [phone].    Increase activity slowly   Complete by:  As directed  Discharge Medications   Allergies as of 10/17/2017      Reactions   Morphine And Related Itching      Medication List    STOP taking these medications   ramipril 10 MG capsule Commonly known as:  ALTACE     TAKE these medications   aspirin EC 81 MG tablet Take 1 tablet (81 mg total) by mouth daily.   atorvastatin 80 MG tablet Commonly known as:  LIPITOR TAKE 1 TABLET BY MOUTH AT BEDTIME What changed:    how much to take  how to take this  when to take this   carvedilol 25 MG tablet Commonly known as:  COREG TAKE 1 TABLET BY MOUTH TWICE DAILY WITH A MEAL What changed:  See the new instructions.   nitroGLYCERIN 0.4 MG SL tablet Commonly known as:  NITROSTAT Place 1 tablet (0.4 mg total) under the tongue every 5 (five) minutes x 3 doses as needed for chest pain. IF NEEDED   sacubitril-valsartan 49-51 MG Commonly known as:  ENTRESTO Take 1 tablet by mouth 2 (two) times daily. Start taking on Sunday morning.   spironolactone 25 MG tablet Commonly known as:  ALDACTONE Take 1 tablet (25 mg total) by mouth daily.   ticagrelor 90 MG Tabs tablet Commonly known as:  BRILINTA Take 1 tablet (90 mg total) by mouth 2 (two) times daily.   varenicline 0.5 MG tablet Commonly known as:  CHANTIX Take 1 tablet (0.5 mg total) by mouth 2 (two) times daily.   varenicline 1 MG tablet Commonly known as:  CHANTIX CONTINUING MONTH PAK Take 1 tablet (1 mg total) by mouth 2 (two) times daily. Start after completing 1 week  Of 1/2 mg tablets.      Aspirin prescribed at discharge?  Yes High Intensity Statin Prescribed? (Lipitor 40-80mg  or Crestor 20-40mg ): Yes Beta Blocker Prescribed? Yes For EF <40%, was ACEI/ARB Prescribed? Yes ADP Receptor Inhibitor Prescribed? (i.e. Plavix etc.-Includes Medically Managed Patients): Yes For EF <40%, Aldosterone Inhibitor Prescribed? Yes Was EF assessed during THIS hospitalization?  Yes Was Cardiac Rehab II ordered? (Included Medically managed Patients): Yes   Outstanding Labs/Studies   BMet at follow up for follow up of initiation of Entresto.   Duration of Discharge Encounter   Greater than 30 minutes including physician time.  Signed, Berton BonJanine Alphonsine Minium NP 10/17/2017, 12:04 PM

## 2017-10-16 NOTE — Care Management Note (Addendum)
Case Management Note  Patient Details  Name: [name] MRN: [id number] Date of Birth: July 19, 1956  Subjective/Objective:  From home with wife, pta indep, s/p coronary stent intervention, will be on brilinta, He has the 30 day free savings coupon and NCM informed him of co pay amt of 47.00 from benefit check.  Also [facility] [place] has brilinta in stock.   2/22 1009 Letha Capeeborah Dymphna Wadley RN, BSN - NCM gave patient the 30 day free savings card for entresto , awaiting benefit check, patient cell is [phone]. NCM informed patient that entresto co pay is around 47.00  For refills.                 Action/Plan: DC home .  Expected Discharge Date:                  Expected Discharge Plan:  Home/Self Care  In-House Referral:     Discharge planning Services  CM Consult, Medication Assistance  Post Acute Care Choice:    Choice offered to:     DME Arranged:    DME Agency:     HH Arranged:    HH Agency:     Status of Service:  Completed, signed off  If discussed at [employer] Length of Stay Meetings, dates discussed:    Additional Comments:  [name], [name] Clinton, RN 10/16/2017, 10:00 AM

## 2017-10-16 NOTE — Research (Signed)
AEGIS II Informed Consent   Subject Name: [name]  Subject met inclusion and exclusion criteria.  The informed consent form, study requirements and expectations were reviewed with the subject and questions and concerns were addressed prior to the signing of the consent form.  The subject verbalized understanding of the trail requirements.  The subject agreed to participate in the AEGIS II trial and signed the informed consent.  The informed consent was obtained prior to performance of any protocol-specific procedures for the subject.  A copy of the signed informed consent was given to the subject and a copy was placed in the subject's medical record.  [name] 10/16/2017, 0800 AM

## 2017-10-16 NOTE — Progress Notes (Signed)
Patient is getting his pacemaker interrogated at this time.

## 2017-10-16 NOTE — Progress Notes (Signed)
CARDIAC REHAB PHASE I   PRE:  Rate/Rhythm: a paced 61  BP:  Supine: 125/90  Sitting:   Standing:    SaO2:   MODE:  Ambulation: 800 ft   POST:  Rate/Rhythm: 95 a paced SR with occasional PVC  BP:  Supine:   Sitting: 145/99  Standing:    SaO2: 100%RA 0820-0912 Pt walked 800 ft on RA with steady gait and no CP.Tolerated well. MI education completed with pt who voiced understanding. Stressed importance of brilinta with stent. Reviewed NTG use, MI restrictions, heart healthy food choices, smoking cessation, signs of CHF with low EF, and ex ed. Pt was reading smoking cessation handout when I entered room. Encouraged to call [phone] quitnow as needed. Pt did not want my fake cigarette. Pt stated he has had low EF for a while and knows to watch for signs of CHF and to watch sodium. Discussed CRP 2 and will refer to [place].   Luetta Nuttingharlene Coston Mandato, RN BSN  10/16/2017 9:09 AM

## 2017-10-16 NOTE — Progress Notes (Signed)
Echocardiogram 2D Echocardiogram has been performed.  Pieter PartridgeBrooke S Soriah Leeman 10/16/2017, 2:40 PM

## 2017-10-16 NOTE — Research (Addendum)
VISIT 1   Inclusions:   Y N       '[x]'$        '[]'$    Male or male at least 60 years of age      '[x]'$        '[]'$    Evidence of type I (spontaneous) MI as defined by the following:      '[x]'$        '[]'$    a. Detection of a rise and/or fall in Troponin I or T with at least 1 value about the 99% upper reference limit.      '[x]'$        '[]'$     (AND)---  Any 1 or more of the following:       '[x]'$        '[]'$    - symptoms of ischemia (ie, resulting from a primary coronary    artery event)      '[]'$        '[]'$    - New or presumably new significant ST/T wave changes or left bundle branch block.      '[]'$        '[]'$         - Development of pathological Q waves on EKG      '[]'$         '[]'$    - Imaging evidence of new loss or viable myocardium or regional wall motion abnormality.      '[]'$        '[]'$    - ID of intracoronary thrombus by angiography.      '[x]'$        '[]'$    No suspicion of acute kidney injury at least 12 hours after angiography OR after first medical contract for subject's not undergoing angiography There must be documented evidence of stable renal function defined as no more than an increase in Serum Creatinine < 0.'3mg'$ /dl from pre-contrast serum creatinine value.  (Before __0.97___   12 hrs after _0.78__)      '[x]'$        '[]'$    Evidence of multi-vessel coronary artery disease defined as:      '[x]'$        '[]'$    A. At least 50% stenosis on >1 epicardial artery or left main artery on catherization performed during the index hospitalization.      '[]'$        '[]'$    B. Prior cardiac catherization with at least 50% stenosis on >1 epicardial artery or left main artery.      '[]'$        '[]'$    C. Prior PCI and evidence of at least 50% stenosis of at least 1 epicardial artery different from prior revascularized artery.      '[]'$        '[]'$    D. Prior multivessel coronary artery bypass grafting.      '[x]'$        '[]'$    At least 1 of the following established risk factors:      '[]'$        '[]'$    o Age ? 65 years      '[x]'$    '[]'$    o Prior history of MI      '[]'$        '[]'$    o On pharmacological treatment for diabetes mellitus      '[]'$        '[]'$    o Peripheral arterial disease defined as meeting at least 1 of the following criteria:      '[]'$        '[]'$            +  Current intermittent claudication or resting limb ischemia and ABI    0.90      '[]'$        '[]'$            +   History of peripheral revascularization (surgical or percutaneous)      '[]'$        '[]'$            +  History of limb amputation due to PAD      '[]'$        '[]'$            +  Angiographic evidence (using computed tomographic angiography, MRA, or invasive angiography or a peripheral artery stenosis ?50%.      '[x]'$        '[]'$    Male subjects must be post-meopausal or with a negative urine                                      pregnancy test prior to randomization. If the urine test cannot be confirmed as negative, a serum pregnancy test will be required. Pregnancy test must be negative.       '[x]'$        '[]'$    Investigator believes that the subject is willing and able to adhere to all protocol requirements.   x     '[x]'$        '[]'$    Willing to not participate in another investigational study until completion of their final study visit.     Exclusions:  Y N       '[]'$         '[x]'$    If these are the reason for MI (pt is excluded)      '[]'$        '[x]'$    1. Myocardial necrosis due mismatch between myocardial oxygen demand and supply, usually due to fixed coronary disease with increased demand leading to MI      '[]'$        '[x]'$    2. Cardiac death due to MI      '[]'$        '[x]'$    3. Myocardial necrosis due to complications from a PCI      '[]'$        '[x]'$    4. Myocardial necrosis due to stent thrombosis      '[]'$        '[x]'$    5. Myocardial necrosis due to in stent restenosis as the only etiology      '[]'$        '[x]'$    6. Myocardial necrosis in the stenting of coronary artery bypass grafting      '[]'$        '[x]'$    Ongoing hemodynamic instability      '[]'$        '[x]'$          +  History of NYHA Class III or IV heart failure within the last year      '[]'$        '[x]'$         +  Killip Class III or IV heart failure      '[]'$        '[x]'$         +  Sustained and/or symptomatic hypotension (SBP <90 mm HG)      '[]'$        '[x]'$         +  Known left  ventricular ejection fraction of <30%      '[]'$        '[x]'$    Evidence of hepatobiliary disease as indicated by any 1 or more of the   following at screening:      '[]'$        '[x]'$         +  Current active hepatic dysfunction or active biliary obstruction      '[]'$        '[x]'$         +  Chronic or prior history of cirrhosis or of infectious / inflammatory hepatitis      '[]'$        '[x]'$         + Hepatic lab abnormalities: ALT > 3 x ULN or Total bilirubin > 2x ULN at randomization.      '[]'$        '[x]'$    Severe chronic kidney disease (eGFR of <72m) or on dialysis      '[]'$        '[x]'$    Plan to undergo scheduled coronary artery bypass graft surgery after randomization, as determined at the time of screening      '[]'$        '[x]'$    Known history of allergies to soybeans, peanuts, albumin      '[]'$        '[x]'$    A known history of IgA deficiency or antibodies to IgA      '[]'$        '[x]'$    A comorbid condition with an estimated life expectancy of ? 6 months at time of consent      '[]'$        '[x]'$    Women who are pregnant or breastfeeding at time of randomization      '[]'$        '[x]'$    Participated in another interventional clinical study at the time of consent      '[]'$        '[x]'$    Treatment with anticancer therapy      '[]'$        '[x]'$    Previously randomized or participated in this study or previously exposed to CSL112    Health Questionnaire  English version for the [employer]   By placing a checkmark in one box in each group below, please indicate which statements best describe your own health state today.     Mobility   I have no problems in walking about X  I have some problems in walking about ?  I am confined to bed ?     Self-Care   I have no  problems with self-care X  I have some problems washing or dressing myself ?  I am unable to wash or dress myself ?     Usual Activities (e.g. work, study, housework, family or leisure activities)   I have no problems with performing my usual activities X  I have some problems with performing my usual activities ?  I am unable to perform my usual activities ?     Pain / Discomfort   I have no pain or discomfort X  I have moderate pain or discomfort ?  I have extreme pain or discomfort ?     Anxiety / Depression   I am not anxious or depressed X  I am moderately anxious or depressed ?  I am extremely anxious or depressed ?   To help people say how good or bad a health state is,  we have drawn a scale (rather like a thermometer) on which the best state you can imagine is marked 100 and the worst state you can imagine is marked 0.    We would like you to indicate on this scale how good or bad your own health is today, in your opinion. Please do this by drawing a line from the box below to whichever point on the scale indicates how good or bad your health state is today.   70  Pre-Contrast Labs  Ref Range & Units 10/14/2017 @ 2224  Sodium 135 - 145 mmol/L 138   Potassium 3.5 - 5.1 mmol/L 4.0   Chloride 101 - 111 mmol/L 103   CO2 22 - 32 mmol/L 27   Glucose, Bld 65 - 99 mg/dL 100 Abnormally high    BUN 6 - 20 mg/dL 20   Creatinine, Ser 0.61 - 1.24 mg/dL 0.97   Calcium 8.9 - 10.3 mg/dL 9.2   GFR calc non Af Amer >60 mL/min >60   GFR calc Af Amer >60 mL/min >60    Post Contrast and Pre Infusion Labs:  Ref Range & Units 10/16/17 @ 02:35  Sodium 135 - 145 mmol/L 135   Potassium 3.5 - 5.1 mmol/L 3.8   Chloride 101 - 111 mmol/L 105   CO2 22 - 32 mmol/L 20 Abnormally low    Glucose, Bld 65 - 99 mg/dL 99   BUN 6 - 20 mg/dL 10   Creatinine, Ser 0.61 - 1.24 mg/dL 0.78   Calcium 8.9 - 10.3 mg/dL 8.9   GFR calc non Af Amer >60 mL/min >60   GFR calc Af Amer >60 mL/min >60     Ref Range  & Units 10/16/17 @ 02:35  Total Protein 6.5 - 8.1 g/dL 6.2 Abnormally low    Albumin 3.5 - 5.0 g/dL 3.4 Abnormally low    AST 15 - 41 U/L 127 Abnormally high    ALT 17 - 63 U/L 29   Alkaline Phosphatase 38 - 126 U/L 98   Total Bilirubin 0.3 - 1.2 mg/dL 1.1   Bilirubin, Direct 0.1 - 0.5 mg/dL 0.1   Indirect Bilirubin 0.3 - 0.9 mg/dL 1.0 Abnormally high

## 2017-10-17 ENCOUNTER — Encounter (HOSPITAL_COMMUNITY): Payer: Self-pay | Admitting: Cardiology

## 2017-10-17 ENCOUNTER — Telehealth: Payer: Self-pay | Admitting: Adult Health

## 2017-10-17 MED ORDER — VARENICLINE TARTRATE 1 MG PO TABS: 1.0000 mg | ORAL_TABLET | Freq: Two times a day (BID) | ORAL | 1 refills | Status: DC

## 2017-10-17 MED ORDER — SACUBITRIL-VALSARTAN 49-51 MG PO TABS: 1.0000 | ORAL_TABLET | Freq: Two times a day (BID) | ORAL | 11 refills | Status: DC

## 2017-10-17 MED ORDER — VARENICLINE TARTRATE 0.5 MG PO TABS: 0.5000 mg | ORAL_TABLET | Freq: Two times a day (BID) | ORAL | 0 refills | Status: DC

## 2017-10-17 MED ORDER — TICAGRELOR 90 MG PO TABS: 90.0000 mg | ORAL_TABLET | Freq: Two times a day (BID) | ORAL | 3 refills | Status: DC

## 2017-10-17 MED ORDER — SACUBITRIL-VALSARTAN 49-51 MG PO TABS: 1.0000 | ORAL_TABLET | Freq: Two times a day (BID) | ORAL | 0 refills | Status: DC

## 2017-10-17 NOTE — Progress Notes (Signed)
CARDIAC REHAB PHASE I   PRE:  Rate/Rhythm: 73 a pacing SR  BP:  Supine:   Sitting: 138/91  Standing:    SaO2:   MODE:  Ambulation: 1000 ft   POST:  Rate/Rhythm: 95 a pacing 95 SR  BP:  Supine:122/73   Sitting:   Standing:    SaO2:  0850-0905 Pt walked 1000 ft on RA with fast steady pace. No CPor ectopy noted. No questions re ed done yesterday.   Luetta Nuttingharlene Vesta [place], RN BSN  10/17/2017 9:02 AM

## 2017-10-17 NOTE — Progress Notes (Signed)
Pharmacy Counseling for Billy BurgerEntresto / Chantix  Discussed Chantix and Entresto with patient and his wife. To begin Entresto Sunday morning for a 48 hour wash out period  Provided with information about the [facility] along with free trial card.  Thank you. Okey RegalLisa Zakari Bathe, PharmD [phone]

## 2017-10-17 NOTE — Progress Notes (Addendum)
Progress Note  Patient Name: [name] Date of Encounter: 10/17/2017  Primary Cardiologist: Peter SwazilandJordan, MD   Subjective   Patient feeling well this morning. No chest pain or dyspnea. Has been up walking in the halls without symptoms.  Inpatient Medications    Scheduled Meds: . AEGIS II (PI-[place]ilty)  170 mL Intravenous Q7 days  . aspirin  81 mg Oral Daily  . atorvastatin  80 mg Oral QHS  . carvedilol  25 mg Oral BID WC  . heparin  5,000 Units Subcutaneous Q8H  . Influenza vac split quadrivalent PF  0.5 mL Intramuscular Tomorrow-1000  . ramipril  10 mg Oral BID  . sodium chloride flush  3 mL Intravenous Q12H  . spironolactone  25 mg Oral Daily  . ticagrelor  90 mg Oral BID   Continuous Infusions: . sodium chloride     PRN Meds: sodium chloride, acetaminophen, ALPRAZolam, ondansetron (ZOFRAN) IV, sodium chloride flush   Vital Signs    Vitals:   10/16/17 1500 10/16/17 2200 10/17/17 0431 10/17/17 0723  BP: 106/66 (!) 124/53 122/70 (!) 138/91  Pulse: 63  67 62  Resp: 20 (!) 25 (!) 26 13  Temp: 98.3 F (36.8 C) 98 F (36.7 C) 98.1 F (36.7 C) 98.1 F (36.7 C)  TempSrc: Oral Oral Oral Oral  SpO2: 95% 98% 98% 98%  Weight:   160 lb 15 oz (73 kg)   Height:        Intake/Output Summary (Last 24 hours) at 10/17/2017 0804 Last data filed at 10/16/2017 1846 Gross per 24 hour  Intake 240 ml  Output -  Net 240 ml   Filed Weights   10/14/17 2200 10/16/17 0619 10/17/17 0431  Weight: 165 lb (74.8 kg) 165 lb 5.5 oz (75 kg) 160 lb 15 oz (73 kg)    Telemetry    Atrial pacing - Personally Reviewed  ECG    Atrial pacing at 62 bpm, new STI in inferior leads - Personally Reviewed  Physical Exam   GEN: No acute distress.   Neck: No JVD Cardiac: RRR, no murmurs, rubs, or gallops.  Respiratory: Clear to auscultation bilaterally. GI: Soft, nontender, non-distended  MS: No edema; No deformity. Neuro:  Nonfocal  Psych: Normal affect   Left radial cath site  without hematoma, drainage or tenderness.   Labs    Chemistry Recent Labs  Lab 10/14/17 2224 10/15/17 1454 10/16/17 0235  NA 138  --  135  K 4.0  --  3.8  CL 103  --  105  CO2 27  --  20*  GLUCOSE 100*  --  99  BUN 20  --  10  CREATININE 0.97 0.81 0.78  CALCIUM 9.2  --  8.9  PROT  --   --  6.2*  ALBUMIN  --   --  3.4*  AST  --   --  127*  ALT  --   --  29  ALKPHOS  --   --  98  BILITOT  --   --  1.1  GFRNONAA >60 >60 >60  GFRAA >60 >60 >60  ANIONGAP 8  --  10     Hematology Recent Labs  Lab 10/14/17 2224 10/16/17 0235  WBC 11.1* 13.2*  RBC 4.39 4.47  HGB 14.0 14.7  HCT 42.7 43.1  MCV 97.3 96.4  MCH 31.9 32.9  MCHC 32.8 34.1  RDW 12.8 13.1  PLT 235 244    Cardiac Enzymes Recent Labs  Lab 10/14/17 2224  10/15/17 0127 10/15/17 0738  TROPONINI <0.03 0.60* 10.06*   No results for input(s): TROPIPOC in the last 168 hours.   BNPNo results for input(s): BNP, PROBNP in the last 168 hours.   DDimer No results for input(s): DDIMER in the last 168 hours.   Radiology    No results found.  Cardiac Studies   Echocardiogram 10/16/17 Study Conclusions - Left ventricle: The cavity size was normal. Wall thickness was   increased in a pattern of mild LVH. Systolic function was   severely reduced. The estimated ejection fraction was in the   range of 25% to 30%. There is akinesis of the anteroseptal and   apical myocardium. Features are consistent with a pseudonormal   left ventricular filling pattern, with concomitant abnormal   relaxation and increased filling pressure (grade 2 diastolic   dysfunction). - Aortic valve: There was trivial regurgitation. - Mitral valve: Calcified annulus. - Left atrium: The atrium was mildly dilated.  Impressions: - Akinesis of the distal anteroseptal and apical walls with overall   severely reduced LV systolic function; moderate diastolic   dysfunction; trace AI; mild  LAE. _____________________________________________________  CORONARY STENT INTERVENTION     05/15/2018  LEFT HEART CATH AND CORS/GRAFTS ANGIOGRAPHY  Conclusion     Mid RCA lesion is 100% stenosed.  Prox Cx lesion is 75% stenosed.  A drug-eluting stent was successfully placed using a STENT SYNERGY DES 3X12.  Post intervention, there is a 0% residual stenosis.  Ost Cx to Prox Cx lesion is 45% stenosed.  Ost 1st Mrg lesion is 90% stenosed.  1st Mrg lesion is 80% stenosed.  A drug-eluting stent was successfully placed using a STENT SYNERGY DES 2.25X24.  Post intervention, there is a 0% residual stenosis.  Prox LAD to Mid LAD lesion is 70% stenosed.  Dist LAD lesion is 90% stenosed.  LIMA graft was visualized by angiography and is normal in caliber.  The graft exhibits no disease.  SVG graft was visualized by angiography and is normal in caliber.  The graft exhibits no disease.  SVG graft was visualized by angiography and is normal in caliber.  The graft exhibits no disease.  There is moderate to severe left ventricular systolic dysfunction.  LV end diastolic pressure is normal.  1. Severe 3 vessel obstructive CAD 2. Patent LIMA to the LAD 3. Patent SVG to the second OM 4. Patent SVG to the PDA. 5. Moderate to severe LV dysfunction. EF estimated at 35% 6. Normal LVEDP 7. Successful stenting of the first OM and proximal LCx with DES x 2  Plan: the first OM was felt to be the culprit lesion. It appeared hazy. There is severe stenosis in the distal LAD but this LV distribution appears akinetic/scarred. Will continue DAPT for one year. Risk factor modification including smoking cessation.     Diagnostic Diagram       Post-Intervention Diagram         Patient Profile     62 y.o. male with hx of CAD S/P anterior STEMI in 2000 w/ DES to LAD, 1 week later cardiac arrest with V fib and 50% in stent restenosis treated with balloon angioplasty and ICD  placed, CABG in 2012, normal myoview 02/2017, CHF, HTN, HLD. STEMI with ventricular tachycardia, troponin 10. Transferred to [facility] for urgent cath.  Assessment & Plan    STEMI with ventricular tachycardia, troponin 10. Urgent cath on 10/16/17 showed patent LIMA to LAD, patent SVG to second OM, patent SVG to PDA, moderate  to severe LV dysfunction with EF 35%, normal LVEDP. The first OM was felt to be culprit lesion and DES X 2 were placed to the first OM and Left Circ. The plan is to continue DAPT for one year and risk factor modification including smoking cessation. EKG this am shows new TWI inferiorly. No chest pain or dyspnea overnight. Has been up walking with symptoms.   CAD: Hx of anterior STEMI in 2000 w/ DES to LAD, 1 week later V fib arrest and 50% in stent restenosis treated with balloon angioplasty and ICD placed, CABG in 2012. On aspirin 81 mg, BB, ACE-I and statin. Stent placed as above. Brillinta 90 mg BID initiated.   Ischemic cardiomyopathy: EF has been down to 25% in the past. Has ICD. Last echo in 02/2017 showed EF 40-45%. Echo done yesterday shows reduced EF down to 25-30% post MI. Volume is stable. Pt is asymptomatic. Medical therapy includes carvedilol 25 mg bid, ramipril 10 mg bid, spironolactone 25 mg daily  NSVT: Pt with Hx Vfib arrest in 200 and had Vtach on presentation with MI on this admission. Has some NSVT on the previous night, post cath. Magnesium supple,emted yesterday. No further ectopy overnight. Echo done yesterday shows reduced EF to 25-30%. ICD device interrogation yesterday showed no VT long enough to trigger intervention and there is no mention of the long run of VT during his MI. Report says normal device function and some adjustments were made. Will have Dr. Antoine Poche review.   Presence of ICD: Followed by Dr. Johney Frame. ICD interrogation on chart.   Hypertension: Bp well controlled. Continues on home carvedilol 25 mg bid, ramipril 10 mg bid, spironolactone 25 mg daily.    Hyperlipidemia: On statin. LDL was 73 in 01/2017.   Tobacco abuse: Pt has continue to smoke. Advised on ill effects of smoking on the heart. He says that he understands and is now going to stop. I offered him pharmacologic assistance and he says that he is just going to stop.   For questions or updates, please contact [facility] Please consult [email or web address] for contact info under Cardiology/STEMI.      Signed, Berton Bon, NP  10/17/2017, 8:04 AM    History and all data above reviewed.  Patient examined.  I agree with the findings as above.  He is ready to go home.  No chest pain.  No SOB.   The patient exam reveals COR:RRR  ,  Lungs: Clear  ,  Abd: Positive bowel sounds, no rebound no guarding, Ext No edema  .  All available labs, radiology testing, previous records reviewed. Agree with documented assessment and plan. VTach:  Rate of detection changed.  Follow with device clinic.  CM:  EF is lower.  Change to Entresto at 51/49 bid dose.  Stop Altace and start Entresto in 36 hours.  (Asked Pharmacy to come and discuss this with the patient).  Tobacco:  He is interested in Chantix.  We will prescribe.    Rollene Rotunda  9:37 AM  10/17/2017

## 2017-10-17 NOTE — Telephone Encounter (Signed)
Patient currently admitted

## 2017-10-17 NOTE — Telephone Encounter (Signed)
Patient has TOC appointment 10-28-17 @ 8:00 am with Joni ReiningKathryn Lawrence.

## 2017-10-17 NOTE — Progress Notes (Signed)
5. S/W LISA @ [facility] # [id number]    ENTRESTO  24 /26 ( sacubitril / valsartan )  COVER- YES  CO-PAY- $ 47.00  Q/L TWO PILL PER DAY  TIER- 3 DRUG  PRIOR APPROVAL- NO   PREFERRED PHARMACY : [facility]

## 2017-10-20 NOTE — Telephone Encounter (Signed)
Left message for pt to call.

## 2017-10-20 NOTE — Telephone Encounter (Signed)
10/14/2017 - 10/17/2017 (3 days) [facility]

## 2017-10-21 NOTE — Telephone Encounter (Signed)
Left detailed message on home phone but was able to speak with pt when called his cell.   Patient [facility]  Patient understands to follow up with provider K. Lyman BishopLawrence, DNP on 10[facility]28/[facility], pt aware he also has an appt with Dr. Johney FrameAllred on 2/27 @ [address] office @ 2:45pm and to arrive 15 minutes early to both appts.  Patient understands discharge instructions? Yes,  Patient understands medications and regiment? No questions and knows to stop his Ramapril.   Patient understands to bring all medications to this visit? yes

## 2017-10-22 ENCOUNTER — Ambulatory Visit (INDEPENDENT_AMBULATORY_CARE_PROVIDER_SITE_OTHER): Payer: Medicare Other | Admitting: Internal Medicine

## 2017-10-22 VITALS — BP 118/74 | HR 71 | Ht 67.0 in | Wt 170.0 lb

## 2017-10-22 DIAGNOSIS — I4729 Other ventricular tachycardia: Secondary | ICD-10-CM

## 2017-10-22 DIAGNOSIS — I5022 Chronic systolic (congestive) heart failure: Secondary | ICD-10-CM

## 2017-10-22 DIAGNOSIS — I200 Unstable angina: Secondary | ICD-10-CM

## 2017-10-22 DIAGNOSIS — I472 Ventricular tachycardia: Secondary | ICD-10-CM | POA: Diagnosis not present

## 2017-10-22 DIAGNOSIS — I255 Ischemic cardiomyopathy: Secondary | ICD-10-CM

## 2017-10-22 DIAGNOSIS — F17200 Nicotine dependence, unspecified, uncomplicated: Secondary | ICD-10-CM | POA: Diagnosis not present

## 2017-10-22 LAB — CUP PACEART INCLINIC DEVICE CHECK
Battery Remaining Longevity: 60 mo
Brady Statistic AP VP Percent: 0 %
Brady Statistic AP VS Percent: 96.86 %
Brady Statistic AS VP Percent: 0 %
Brady Statistic AS VS Percent: 3.14 %
Brady Statistic RV Percent Paced: 0 %
Date Time Interrogation Session: 20190227162258
HIGH POWER IMPEDANCE MEASURED VALUE: 71 Ohm
Implantable Lead Implant Date: 20100414
Implantable Lead Location: 753859
Implantable Lead Model: 5076
Implantable Pulse Generator Implant Date: 20140711
Lead Channel Impedance Value: 323 Ohm
Lead Channel Impedance Value: 342 Ohm
Lead Channel Pacing Threshold Amplitude: 1 V
Lead Channel Pacing Threshold Pulse Width: 0.4 ms
Lead Channel Sensing Intrinsic Amplitude: 6.125 mV
Lead Channel Setting Sensing Sensitivity: 0.3 mV
MDC IDC LEAD IMPLANT DT: 20140711
MDC IDC LEAD LOCATION: 753860
MDC IDC LEAD SERIAL: 323897
MDC IDC MSMT BATTERY VOLTAGE: 2.95 V
MDC IDC MSMT LEADCHNL RA IMPEDANCE VALUE: 513 Ohm
MDC IDC MSMT LEADCHNL RA SENSING INTR AMPL: 2 mV
MDC IDC SET LEADCHNL RA PACING AMPLITUDE: 2.25 V
MDC IDC SET LEADCHNL RV PACING AMPLITUDE: 2.5 V
MDC IDC SET LEADCHNL RV PACING PULSEWIDTH: 0.4 ms
MDC IDC STAT BRADY RA PERCENT PACED: 96.79 %

## 2017-10-22 NOTE — Progress Notes (Signed)
PCP: Adrian Prince, MD Primary Cardiologist:  [name] Primary EP: Dr Larena Sox is a 62 y.o. male who presents today for routine electrophysiology followup.  Since last being seen in our clinic, the patient reports doing very well.  Today, he denies symptoms of palpitations, chest pain, shortness of breath,  lower extremity edema, dizziness, presyncope, syncope, or ICD shocks.  The patient is otherwise without complaint today.   Past Medical History:  Diagnosis Date  . Acute anterior myocardial infarction (HCC) 09/02/2008   post emergent stenting of the LAD in January 2010 -- anterior ST-elevation myocardial infarction  . Adrenal mass (HCC)    currently being evaluated -- 4.1 cm right adrenal mass  . AICD (automatic cardioverter/defibrillator) present 02/2013   Medtronic   . Anemia   . CAD (coronary artery disease)    2000-STEMI, DES to LAD, 1wk later VF arrest d/t 50% ISR, 2012- CABG X3, 10/16/17 STEMI with VT-, LHC patnet graft, DES to 1st OM & Circ  . Cardiac arrest - ventricular fibrillation 09/02/2008   Out of hospital cardiac arrest secondary to arrhythmia  . CHF NYHA class II (HCC)     New York Heart Association class II/III heart failure  . Ejection fraction < 50%    30-35%, 10/16/17- post STEMI EF 25-30%  . History of methicillin resistant staphylococcus aureus (MRSA)    History of methicillin-sensitive Staphylococcus aureus bacteremia  with implantable cardioverter-defibrillator explant, February 23, 2009  . HL (hearing loss)   . Hyperlipidemia   . Hypertension   . Ischemic cardiomyopathy    severe ischemic cardiomyopathy -- ejection fraction 30-35%  . NSTEMI (non-ST elevated myocardial infarction) (HCC) 10/14/2017   [name] 10/15/2017  . Pericarditis    history of acute pericarditis in July 2010 now resolved  . Polymorphic ventricular tachycardia (HCC)    with VF arrest, s/p subsequent ICD  . S/P CABG (coronary artery bypass graft) Sept 2012  . STEMI (ST  elevation myocardial infarction) [facility]) 2000   [name] 10/15/2017   Past Surgical History:  Procedure Laterality Date  . ANKLE SURGERY    . CARDIAC CATHETERIZATION  08/26/2008   Est. EF of 45% -- Successful intracoronary stenting of the mid-LAD -- Three-vessel atherosclerotic coronary artery disease.  The patient has occlusion of the mid LAD, which is his culprit lesion.  He has  moderate disease in the proximal circumflex and PDA -- Moderate left ventricular dysfunction -- [name], [name]  . CARDIAC CATHETERIZATION  03/16/2009    EF was approximately 30% -- Atherosclerotic coronary vascular disease, three-vessel --  Moderate-to-severe ischemic cardiomyopathy -- No clear evidence for an acute culprit lesion that would explain the patient's chest pain -- [name], [name].   . CARDIAC CATHETERIZATION  10/15/2017  . CORONARY ARTERY BYPASS GRAFT  05/07/2011   CABG x3:  LIMA to LAD, SVG to OM2, SVG to PDA  . CORONARY STENT INTERVENTION N/A 10/15/2017   Procedure: CORONARY STENT INTERVENTION;  Surgeon: Swaziland, Peter M, MD;  Location: [facility] INVASIVE CV LAB;  Service: Cardiovascular;  Laterality: N/A;  . ICD implant  02/23/2009    with subsequent extraction for MSSA bacteremia  . ICD reimplantation  05/09/2009   status post implant of a Medtronic Maximo II DR dual- chamber cardioverter defibrillator by Dr. Hillis Range  . IMPLANTABLE CARDIOVERTER DEFIBRILLATOR REVISION  03/05/13   RV lead fracture.  A new [employer] [id number] lead was placed  . LEAD REVISION Left 03/05/2013   Procedure: LEAD  REVISION;  Surgeon: Hillis Range, MD;  Location: [facility] CATH LAB;  Service: Cardiovascular;  Laterality: Left;  . LEFT HEART CATH AND CORS/GRAFTS ANGIOGRAPHY N/A 10/15/2017   Procedure: LEFT HEART CATH AND CORS/GRAFTS ANGIOGRAPHY;  Surgeon: Swaziland, Peter M, MD;  Location: [facility] INVASIVE CV LAB;  Service: Cardiovascular;  Laterality: N/A;    ROS- all systems are reviewed and negative except as per HPI  above  Current Outpatient Medications  Medication Sig Dispense Refill  . aspirin EC 81 MG tablet Take 1 tablet (81 mg total) by mouth daily. 90 tablet 3  . atorvastatin (LIPITOR) 80 MG tablet TAKE 1 TABLET BY MOUTH AT BEDTIME (Patient taking differently: TAKE 1 TABLET (80mg ) BY MOUTH AT BEDTIME) 90 tablet 2  . carvedilol (COREG) 25 MG tablet TAKE 1 TABLET BY MOUTH TWICE DAILY WITH A MEAL (Patient taking differently: TAKE 1 TABLET (25mg ) BY MOUTH TWICE DAILY WITH A MEAL) 180 tablet 2  . nitroGLYCERIN (NITROSTAT) 0.4 MG SL tablet Place 1 tablet (0.4 mg total) under the tongue every 5 (five) minutes x 3 doses as needed for chest pain. IF NEEDED 25 tablet 6  . sacubitril-valsartan (ENTRESTO) 49-51 MG Take 1 tablet by mouth 2 (two) times daily. Start taking on Sunday morning. 60 tablet 11  . spironolactone (ALDACTONE) 25 MG tablet Take 1 tablet (25 mg total) by mouth daily. 90 tablet 3  . ticagrelor (BRILINTA) 90 MG TABS tablet Take 1 tablet (90 mg total) by mouth 2 (two) times daily. 180 tablet 3  . varenicline (CHANTIX CONTINUING MONTH PAK) 1 MG tablet Take 1 tablet (1 mg total) by mouth 2 (two) times daily. Start after completing 1 week  Of 1/2 mg tablets. 60 tablet 1  . varenicline (CHANTIX) 0.5 MG tablet Take 1 tablet (0.5 mg total) by mouth 2 (two) times daily. 14 tablet 0   No current facility-administered medications for this visit.     Physical Exam: Vitals:   10/22/17 1454  BP: 118/74  Pulse: 71  Weight: 170 lb (77.1 kg)  Height: 5\' 7"  (1.702 m)    GEN- The patient is well appearing, alert and oriented x 3 today.   Head- normocephalic, atraumatic Eyes-  Sclera clear, conjunctiva pink Ears- hearing intact Oropharynx- clear Lungs- Clear to ausculation bilaterally, normal work of breathing Chest- ICD pocket is well healed Heart- Regular rate and rhythm, no murmurs, rubs or gallops, PMI not laterally displaced GI- soft, NT, ND, + BS Extremities- no clubbing, cyanosis, or  edema  ICD interrogation- reviewed in detail today,  See PACEART report  ekg tracing ordered today is personally reviewed and shows atrial paced rhythm 70 bpm, PR 234 msec, Qtc 406 msec  Echo 10/16/17- EF 25-30%, anteroseptal AK Cath 10/15/17 also reviewed and revealed: 1. Severe 3 vessel obstructive CAD 2. Patent LIMA to the LAD 3. Patent SVG to the second OM 4. Patent SVG to the PDA. 5. Moderate to severe LV dysfunction. EF estimated at 35% 6. Normal LVEDP 7. Successful stenting of the first OM and proximal LCx with DES x 2  Assessment and Plan:  1.  Chronic systolic dysfunction/ CAD/ ischemic CM euvolemic today Recently s/p MI, now improved Stable on an appropriate medical regimen  2. VT S/p prior VF arrest Recent NSVT during admit for MI Normal ICD function See Pace Art report No changes today  3. Tobacco He has quit with his recent hospitalization!  carelink Return to see EP NP in 6 months Follow-up with [name] as scheduled  Hillis RangeJames Therese Rocco MD, [facility]ACC 10/22/2017 3:20 PM

## 2017-10-22 NOTE — Research (Signed)
Visit 2 infusion 1  Infusion given.                                    "CONSENT"   YES     NO   Continuing further Investigational Product and study visits for follow-up?     X     Continuing consent from future biomedical research     X                                     "EVENTS"    YES     NO  AE   (IF YES SEE SOURCE)      X  SAE  (IF YES SEE SOURCE)      X  ENDPOINT   (IF YES SEE SOURCE)      X  REVASCULARIZATION  (IF YES SEE SOURCE)      X  AMPUTATION   (IF YES SEE SOURCE)      X  TROPONIN'S  (IF YES SEE SOURCE)      X    Central labs:

## 2017-10-22 NOTE — Patient Instructions (Signed)
Medication Instructions:  Your physician recommends that you continue on your current medications as directed. Please refer to the Current Medication list given to you today.   Labwork: None ordered  Testing/Procedures: None ordered  Follow-Up: Remote monitoring is used to monitor your ICD from home. This monitoring reduces the number of office visits required to check your device to one time per year. It allows us to keep an eye on the functioning of your device to ensure it is working properly. You are scheduled for a device check from home on 12/11/17. You may send your transmission at any time that day. If you have a wireless device, the transmission will be sent automatically. After your physician reviews your transmission, you will receive a postcard with your next transmission date.    Your physician wants you to follow-up in: 6 months with Billy BalsamAmber Seiler, NP. You will receive a reminder letter in the mail two months in advance. If you don't receive a letter, please call our office to schedule the follow-up appointment.   Any Other Special Instructions Will Be Listed Below (If Applicable).     If you need a refill on your cardiac medications before your next appointment, please call your pharmacy.

## 2017-10-24 ENCOUNTER — Ambulatory Visit (HOSPITAL_COMMUNITY)
Admission: RE | Admit: 2017-10-24 | Discharge: 2017-10-24 | Disposition: A | Discharge status: Home / Self Care | Payer: Medicare Other | Source: Ambulatory Visit | Attending: Internal Medicine | Admitting: Internal Medicine

## 2017-10-24 ENCOUNTER — Other Ambulatory Visit: Payer: Self-pay | Admitting: Internal Medicine

## 2017-10-24 ENCOUNTER — Encounter: Payer: Self-pay | Admitting: Internal Medicine

## 2017-10-24 ENCOUNTER — Encounter: Payer: Medicare Other | Admitting: *Deleted

## 2017-10-24 DIAGNOSIS — L270 Generalized skin eruption due to drugs and medicaments taken internally: Secondary | ICD-10-CM | POA: Diagnosis not present

## 2017-10-24 LAB — BASIC METABOLIC PANEL
Anion gap: 8 (ref 5–15)
BUN: 19 mg/dL (ref 6–20)
CHLORIDE: 105 mmol/L (ref 101–111)
CO2: 21 mmol/L — AB (ref 22–32)
Calcium: 9.3 mg/dL (ref 8.9–10.3)
Creatinine, Ser: 0.94 mg/dL (ref 0.61–1.24)
GFR calc Af Amer: >60 mL/min (ref >60)
GLUCOSE: 105 mg/dL — AB (ref 65–99)
POTASSIUM: 5.7 mmol/L — AB (ref 3.5–5.1)
Sodium: 134 mmol/L — ABNORMAL LOW (ref 135–145)

## 2017-10-24 MED ORDER — HYDROCORTISONE 1 % EX LOTN: TOPICAL_LOTION | CUTANEOUS | 1 refills | Status: DC

## 2017-10-24 NOTE — Progress Notes (Signed)
Research Patient Encounter  Patient Name: [name] Date of Encounter: 10/24/2017  Chief Complaint   Itchy Rash  Patient Profile   62 yo male patient enrolled in the AEGIS-II trial, presented for infusion #2, noted to have an itchy rash.  Subjective   [name] is a 62 yo male who is enrolled in the AEGIS-II trial. He presented today for infusion #2. He notes that he has had a rash since this past Tuesday. He reports it is pruritic, noted mostly across his trunk, posterior neck, ears and lower extremities. New medicines include Brillinta (which was started after cath) and Delene Loll - he was recently washed out of ramipril and this was started. He denies shortness of breath, chest pain or fatigue. Vitals are stable - no S/S of anaphylaxis.   Objective   BP 96/59 P 75 RR 18 SPO2 99% T - 97.8  Physical Exam   General appearance: alert and no distress Neck: no carotid bruit, no JVD and thyroid not enlarged, symmetric, no tenderness/mass/nodules Lungs: clear to auscultation bilaterally Heart: regular rate and rhythm Abdomen: soft, non-tender; bowel sounds normal; no masses,  no organomegaly Extremities: diffuse eryhematous papular rash over the abdomen, extremities, back of the neck. There is central clearing around EKG lead patches on the abdomen. Pulses: 2+ and symmetric Skin: Diffuse pruritic papular rash Neurologic: Grossly normal Psych: Pleasant  Inpatient Medications    Scheduled Meds:   Continuous Infusions:   PRN Meds:    Labs   Results for orders placed or performed during the hospital encounter of 10/24/17 (from the past 48 hour(s))  Basic metabolic panel     Status: Abnormal   Collection Time: 10/24/17  8:58 AM  Result Value Ref Range   Sodium 134 (L) 135 - 145 mmol/L   Potassium 5.7 (H) 3.5 - 5.1 mmol/L    Comment: HEMOLYSIS AT THIS LEVEL MAY AFFECT RESULT   Chloride 105 101 - 111 mmol/L   CO2 21 (L) 22 - 32 mmol/L   Glucose, Bld 105 (H) 65  - 99 mg/dL   BUN 19 6 - 20 mg/dL   Creatinine, Ser 0.94 0.61 - 1.24 mg/dL   Calcium 9.3 8.9 - 10.3 mg/dL   GFR calc non Af Amer >60 >60 mL/min   GFR calc Af Amer >60 >60 mL/min    Comment: (NOTE) The eGFR has been calculated using the CKD EPI equation. This calculation has not been validated in all clinical situations. eGFR's persistently <60 mL/min signify possible Chronic Kidney Disease.    Anion gap 8 5 - 15    Comment: Performed at [facility] [address], [place], [place] [address]    ECG   N/A  Telemetry   N/A  Radiology    No results found.  Cardiac Studies   N/A  Assessment   1. Drug-induced skin rash  Plan   1. Mr. Fiumara is likely experiencing a drug rash. My suspicion is that this is related to [facility] - he is noted to be hyperkalemic today on labs with potassium of 5.7. He is on spironolactone as well. Creatinine is normal. BP is soft today - however, LVEF 25% and BP has been lower. I would recommend holding Entresto - will allow it to wash out the the patient is instructed to restart his prior dose of ramipril on Monday (3/4). I cannot r/o that this is related to the study infusion, therefore, we will not infuse the patient today and re-assess him on  Monday. I have advised him to hold the spironolactone as well over the weekend. We will repeat labs on Monday 3/4. He should eat a low potassium diet. Also advised him to take benadryl OTC for itching and will Rx for a topical steroid lotion.  Time Spent Directly with Patient:  I have spent a total of 35 minutes with the patient reviewing hospital notes, telemetry, EKGs, labs and examining the patient as well as establishing an assessment and plan that was discussed personally with the patient. > 50% of time was spent in direct patient care.  Pixie Casino, MD, [facility], [place] Director of the Advanced Lipid Disorders &  Cardiovascular Risk Reduction  Clinic Diplomate of the American Board of Clinical Lipidology Attending Cardiologist  Direct Dial: [phone]  Fax: [phone]  Website:  [email or web address] Kanisha Duba 10/24/2017, 10:27 AM

## 2017-10-27 ENCOUNTER — Encounter (HOSPITAL_COMMUNITY)
Admission: RE | Admit: 2017-10-27 | Discharge: 2017-10-27 | Disposition: A | Discharge status: Home / Self Care | Payer: Medicare Other | Source: Ambulatory Visit | Attending: Internal Medicine | Admitting: Internal Medicine

## 2017-10-27 ENCOUNTER — Other Ambulatory Visit (HOSPITAL_COMMUNITY): Payer: Self-pay | Admitting: *Deleted

## 2017-10-27 ENCOUNTER — Encounter: Payer: Medicare Other | Admitting: *Deleted

## 2017-10-27 VITALS — BP 110/75 | HR 64

## 2017-10-27 DIAGNOSIS — Z006 Encounter for examination for normal comparison and control in clinical research program: Secondary | ICD-10-CM

## 2017-10-27 LAB — BASIC METABOLIC PANEL
ANION GAP: 8 (ref 5–15)
BUN: 15 mg/dL (ref 6–20)
CHLORIDE: 103 mmol/L (ref 101–111)
CO2: 25 mmol/L (ref 22–32)
CREATININE: 1.03 mg/dL (ref 0.61–1.24)
Calcium: 9.4 mg/dL (ref 8.9–10.3)
GFR calc non Af Amer: >60 mL/min (ref >60)
Glucose, Bld: 98 mg/dL (ref 65–99)
POTASSIUM: 4.7 mmol/L (ref 3.5–5.1)
SODIUM: 136 mmol/L (ref 135–145)

## 2017-10-27 MED ORDER — STUDY - AEGIS II STUDY - PLACEBO OR CSL112 (PI-HILTY)
170.0000 mL | INTRAVENOUS | Status: DC
  Administered 2017-10-27: 170 mL via INTRAVENOUS
  Filled 2017-10-27: qty 170

## 2017-10-27 NOTE — Progress Notes (Signed)
Visit 3 Infusion 2  Pt feeling much better. No more rash on abdomen or ear. Stopped the TerrellEntresto on Friday. Pt didn't take any Benadryl or use any cream for abdomen. Started the ramipril this morning 10/27/17. Will see heart PA 10/28/17 for follow up.  Local labs drawn this am, K+ is better today and other labs stable. Will continue to monitor.   Central labs and local labs drawn this am.                                    "CONSENT"   YES     NO   Continuing further Investigational Product and study visits for follow-up?      X     Continuing consent from future biomedical research     X                                      "EVENTS"    YES     NO  AE   (IF YES SEE SOURCE)     X       SAE  (IF YES SEE SOURCE)      X  ENDPOINT   (IF YES SEE SOURCE)     X  REVASCULARIZATION  (IF YES SEE SOURCE)      X  AMPUTATION   (IF YES SEE SOURCE)      X  TROPONIN'S  (IF YES SEE SOURCE)      X

## 2017-10-27 NOTE — Progress Notes (Signed)
Cardiology Office Note   Date:  10/28/2017   ID:  [name], DOB [birth year], MRN [id number]  PCP:  Adrian PrinceSouth, Stephen, MD  [facility]  [facility]  Chief Complaint  Patient presents with  . Follow-up  . Congestive Heart Failure    62 yo male patient enrolled in the AEGIS-II trial,AEGIS-II is a Phase 3, multicenter, double-blind, randomized, placebo-controlled, parallel-group study designed to evaluate the efficacy and safety of CSL112 on reducing the risk of MACE (Major Adverse Cardiac Event), defined as cardiovascular death, myocardial infarction, and stroke in acute coronary syndrome (ACS) patients who are receiving evidence-based medical therapy, diagnosed with either ST-elevation myocardial infarction (STEMI) or non-ST-elevation myocardial infarction (NSTEMI), including those managed with percutaneous coronary intervention (PCI) or medically managed)..  History of Present Illness: [name] is a 62 y.o. male who presents for ongoing assessment and management of CAD, stent to LAD in 08/2008, CABG 10/16/2017, AICD in situ, CHF with NYHA Class III, Hypertension and ICM. On last office visit, the patient was having a drug reaction rash. He was seen by Dr. Rennis GoldenHilty on 10/24/2017 who suspected that it was [facility]. He was also found to be hyperkalemic with potassium 5.7 on spironolactone. LVEF was noted at 25%. .  Dr. Rennis GoldenHilty [facility] and restart ramipril on on 10/27/2017; and also to hold spironolactone. He was advised to take Benadryl for itching and given Rx for steroid cream.     He filling much better today. He states he did not have to take Benadryl or use the steroid lotion as when he stopped taking Entresto the rash went away almost immediately. The patient has been tolerating all of his medications now. He has an appointment with research in one week.  Past Medical History:  Diagnosis Date  . Acute anterior myocardial infarction (HCC) 09/02/2008   post  emergent stenting of the LAD in January 2010 -- anterior ST-elevation myocardial infarction  . Adrenal mass (HCC)    currently being evaluated -- 4.1 cm right adrenal mass  . AICD (automatic cardioverter/defibrillator) present 02/2013   Medtronic   . Anemia   . CAD (coronary artery disease)    2000-STEMI, DES to LAD, 1wk later VF arrest d/t 50% ISR, 2012- CABG X3, 10/16/17 STEMI with VT-, LHC patnet graft, DES to 1st OM & Circ  . Cardiac arrest - ventricular fibrillation 09/02/2008   Out of hospital cardiac arrest secondary to arrhythmia  . CHF NYHA class II (HCC)     New York Heart Association class II/III heart failure  . Ejection fraction < 50%    30-35%, 10/16/17- post STEMI EF 25-30%  . History of methicillin resistant staphylococcus aureus (MRSA)    History of methicillin-sensitive Staphylococcus aureus bacteremia  with implantable cardioverter-defibrillator explant, February 23, 2009  . HL (hearing loss)   . Hyperlipidemia   . Hypertension   . Ischemic cardiomyopathy    severe ischemic cardiomyopathy -- ejection fraction 30-35%  . NSTEMI (non-ST elevated myocardial infarction) (HCC) 10/14/2017   [name]/notes 10/15/2017  . Pericarditis    history of acute pericarditis in July 2010 now resolved  . Polymorphic ventricular tachycardia (HCC)    with VF arrest, s/p subsequent ICD  . S/P CABG (coronary artery bypass graft) Sept 2012  . STEMI (ST elevation myocardial infarction) [facility](HCC) 2000   [name]/notes 10/15/2017    Past Surgical History:  Procedure Laterality Date  . ANKLE SURGERY    . CARDIAC CATHETERIZATION  08/26/2008   Est. EF of  45% -- Successful intracoronary stenting of the mid-LAD -- Three-vessel atherosclerotic coronary artery disease.  The patient has occlusion of the mid LAD, which is his culprit lesion.  He has  moderate disease in the proximal circumflex and PDA -- Moderate left ventricular dysfunction -- [name], [name]  . CARDIAC CATHETERIZATION  03/16/2009    EF was  approximately 30% -- Atherosclerotic coronary vascular disease, three-vessel --  Moderate-to-severe ischemic cardiomyopathy -- No clear evidence for an acute culprit lesion that would explain the patient's chest pain -- [name], [name].   . CARDIAC CATHETERIZATION  10/15/2017  . CORONARY ARTERY BYPASS GRAFT  05/07/2011   CABG x3:  LIMA to LAD, SVG to OM2, SVG to PDA  . CORONARY STENT INTERVENTION N/A 10/15/2017   Procedure: CORONARY STENT INTERVENTION;  Surgeon: Swaziland, Peter M, MD;  Location: [facility] INVASIVE CV LAB;  Service: Cardiovascular;  Laterality: N/A;  . ICD implant  02/23/2009    with subsequent extraction for MSSA bacteremia  . ICD reimplantation  05/09/2009   status post implant of a Medtronic Maximo II DR dual- chamber cardioverter defibrillator by Dr. Hillis Range  . IMPLANTABLE CARDIOVERTER DEFIBRILLATOR REVISION  03/05/13   RV lead fracture.  A new [employer] [id number] lead was placed  . LEAD REVISION Left 03/05/2013   Procedure: LEAD REVISION;  Surgeon: Hillis Range, MD;  [facility];  Service: Cardiovascular;  Laterality: Left;  . LEFT HEART CATH AND CORS/GRAFTS ANGIOGRAPHY N/A 10/15/2017   Procedure: LEFT HEART CATH AND CORS/GRAFTS ANGIOGRAPHY;  Surgeon: Swaziland, Peter M, MD;  Location: [facility] INVASIVE CV LAB;  Service: Cardiovascular;  Laterality: N/A;     Current Outpatient Medications  Medication Sig Dispense Refill  . aspirin EC 81 MG tablet Take 1 tablet (81 mg total) by mouth daily. 90 tablet 3  . atorvastatin (LIPITOR) 80 MG tablet TAKE 1 TABLET BY MOUTH AT BEDTIME (Patient taking differently: TAKE 1 TABLET (80mg ) BY MOUTH AT BEDTIME) 90 tablet 2  . carvedilol (COREG) 25 MG tablet TAKE 1 TABLET BY MOUTH TWICE DAILY WITH A MEAL (Patient taking differently: TAKE 1 TABLET (25mg ) BY MOUTH TWICE DAILY WITH A MEAL) 180 tablet 2  . nitroGLYCERIN (NITROSTAT) 0.4 MG SL tablet Place 1 tablet (0.4 mg total) under the tongue every 5 (five) minutes x 3 doses as needed for  chest pain. IF NEEDED 25 tablet 6  . ramipril (ALTACE) 10 MG capsule Take 10 mg by mouth 2 (two) times daily.    Marland Kitchen spironolactone (ALDACTONE) 25 MG tablet Take 1 tablet (25 mg total) by mouth daily. 90 tablet 3  . ticagrelor (BRILINTA) 90 MG TABS tablet Take 1 tablet (90 mg total) by mouth 2 (two) times daily. 180 tablet 3  . varenicline (CHANTIX CONTINUING MONTH PAK) 1 MG tablet Take 1 tablet (1 mg total) by mouth 2 (two) times daily. Start after completing 1 week  Of 1/2 mg tablets. (Patient not taking: Reported on 10/28/2017) 60 tablet 1  . varenicline (CHANTIX) 0.5 MG tablet Take 1 tablet (0.5 mg total) by mouth 2 (two) times daily. (Patient not taking: Reported on 10/28/2017) 14 tablet 0   No current facility-administered medications for this visit.     Allergies:   Morphine and related    Social History:  The patient  reports that he quit smoking about 2 weeks ago. His smoking use included cigarettes. He quit after 37.00 years of use. he has never used smokeless tobacco. He reports that he does not  drink alcohol or use drugs.   Family History:  The patient's family history includes Coronary artery disease in his father; Stroke in his mother.    ROS: All other systems are reviewed and negative. Unless otherwise mentioned in H&P    PHYSICAL EXAM: VS:  BP 118/78   Pulse 62   Ht 5\' 7"  (1.702 m)   Wt 168 lb 9.6 oz (76.5 kg)   BMI 26.41 kg/m  , BMI Body mass index is 26.41 kg/m. GEN: Well nourished, well developed, in no acute distress  HEENT: normal  Neck: no JVD, carotid bruits, or masses Cardiac: RRR; no murmurs, rubs, or gallops,no edema  Respiratory:  clear to auscultation bilaterally, normal work of breathing GI: soft, nontender, nondistended, + BS MS: no deformity or atrophy , mild edema in the lower right leg compared to the left (the patient has broken his ankle in the past) Skin: warm and dry, no rash Neuro:  Strength and sensation are intact Psych: euthymic mood, full  affect   EKG:  Atrial paced with prolonged AV conduction. Heart rate is 62 bpm.  Recent Labs: 10/15/2017: Magnesium 1.7 10/16/2017: ALT 29; Hemoglobin 14.7; Platelets 244 10/27/2017: BUN 15; Creatinine, Ser 1.03; Potassium 4.7; Sodium 136    Lipid Panel    Component Value Date/Time   CHOL 120 02/07/2017 0839   TRIG 100 02/07/2017 0839   HDL 27 (L) 02/07/2017 0839   CHOLHDL 4.4 02/07/2017 0839   CHOLHDL 4.7 03/11/2016 0818   VLDL 16 03/11/2016 0818   LDLCALC 73 02/07/2017 0839   LDLDIRECT 217.3 04/04/2011 1124      Wt Readings from Last 3 Encounters:  10/28/17 168 lb 9.6 oz (76.5 kg)  10/27/17 165 lb (74.8 kg)  10/24/17 168 lb (76.2 kg)   Other studies Reviewed:  Echocardiogram 10/16/2017 Left ventricle: The cavity size was normal. Wall thickness was   increased in a pattern of mild LVH. Systolic function was   severely reduced. The estimated ejection fraction was in the   range of 25% to 30%. There is akinesis of the anteroseptal and   apical myocardium. Features are consistent with a pseudonormal   left ventricular filling pattern, with concomitant abnormal   relaxation and increased filling pressure (grade 2 diastolic   dysfunction). - Aortic valve: There was trivial regurgitation. - Mitral valve: Calcified annulus. - Left atrium: The atrium was mildly dilated.  Cardiac Cath 10/15/2017 Conclusion     Mid RCA lesion is 100% stenosed.  Prox Cx lesion is 75% stenosed.  A drug-eluting stent was successfully placed using a STENT SYNERGY DES 3X12.  Post intervention, there is a 0% residual stenosis.  Ost Cx to Prox Cx lesion is 45% stenosed.  Ost 1st Mrg lesion is 90% stenosed.  1st Mrg lesion is 80% stenosed.  A drug-eluting stent was successfully placed using a STENT SYNERGY DES 2.25X24.  Post intervention, there is a 0% residual stenosis.  Prox LAD to Mid LAD lesion is 70% stenosed.  Dist LAD lesion is 90% stenosed.  LIMA graft was visualized by  angiography and is normal in caliber.  The graft exhibits no disease.  SVG graft was visualized by angiography and is normal in caliber.  The graft exhibits no disease.  SVG graft was visualized by angiography and is normal in caliber.  The graft exhibits no disease.  There is moderate to severe left ventricular systolic dysfunction.  LV end diastolic pressure is normal.   1. Severe 3 vessel obstructive CAD 2. Patent  LIMA to the LAD 3. Patent SVG to the second OM 4. Patent SVG to the PDA. 5. Moderate to severe LV dysfunction. EF estimated at 35% 6. Normal LVEDP 7. Successful stenting of the first OM and proximal LCx with DES x 2    ASSESSMENT AND PLAN:  1.  Coronary artery disease:: Coronary artery bypass grafting, and stenting of the first OM and proximal LCx with DES x 2. Doing well without recurrence of discomfort, dyspnea on exertion, or fatigue. Continue current medication regimen to include dual antiplatelet therapy with carvedilol, aspirin and Brilinta.  2. Ischemic cardiomyopathy: With systolic dysfunction: Most recent ejection fraction was found to be significantly reduced at 25% to 30%. He is status post ICD implantation.  The patient did not tolerate Entresto which will be added to his allergy list. He was taken off Entresto the patient rash resolved. He will remain on REM a pro-and spironolactone. He will continue low-sodium diet with daily weights.  3. ICD in situ: Followed by Dr. Johney Frame with care patient's per protocol.  4. Research study participant: enrolled in the AEGIS-II trial. Disease research in one week.  Current medicines are reviewed at length with the patient today.    Labs/ tests ordered today include: None  Bettey Mare. Liborio Nixon, ANP, AACC   10/28/2017 7:56 AM    [facility] [address], [place], [place] [address] Phone: [phone]; Fax: [phone]

## 2017-10-28 ENCOUNTER — Encounter: Payer: Self-pay | Admitting: Adult Health

## 2017-10-28 ENCOUNTER — Ambulatory Visit (INDEPENDENT_AMBULATORY_CARE_PROVIDER_SITE_OTHER): Payer: Medicare Other | Admitting: Adult Health

## 2017-10-28 DIAGNOSIS — I2510 Atherosclerotic heart disease of native coronary artery without angina pectoris: Secondary | ICD-10-CM

## 2017-10-28 DIAGNOSIS — I5022 Chronic systolic (congestive) heart failure: Secondary | ICD-10-CM | POA: Diagnosis not present

## 2017-10-28 DIAGNOSIS — I10 Essential (primary) hypertension: Secondary | ICD-10-CM | POA: Diagnosis not present

## 2017-10-28 DIAGNOSIS — Z95810 Presence of automatic (implantable) cardiac defibrillator: Secondary | ICD-10-CM | POA: Diagnosis not present

## 2017-10-28 DIAGNOSIS — I43 Cardiomyopathy in diseases classified elsewhere: Secondary | ICD-10-CM

## 2017-10-28 DIAGNOSIS — E7800 Pure hypercholesterolemia, unspecified: Secondary | ICD-10-CM | POA: Diagnosis not present

## 2017-10-28 DIAGNOSIS — I200 Unstable angina: Secondary | ICD-10-CM

## 2017-10-28 NOTE — Patient Instructions (Signed)
Medication Instructions:  NO CHANGES- Your physician recommends that you continue on your current medications as directed. Please refer to the Current Medication list given to you today.  If you need a refill on your cardiac medications before your next appointment, please call your pharmacy.  Follow-Up: Your physician wants you to follow-up in: 6 MONTHS WITH DR HILTY. You should receive a reminder letter in the mail two months in advance. If you do not receive a letter, please call our office 02-2018 to schedule the 04-2018 follow-up appointment.   Thank you for choosing [facility]!!      

## 2017-10-31 ENCOUNTER — Encounter (HOSPITAL_COMMUNITY): Payer: Medicare Other

## 2017-11-03 ENCOUNTER — Encounter (HOSPITAL_COMMUNITY)
Admission: RE | Admit: 2017-11-03 | Discharge: 2017-11-03 | Disposition: A | Discharge status: Home / Self Care | Payer: Medicare Other | Source: Ambulatory Visit | Attending: Internal Medicine | Admitting: Internal Medicine

## 2017-11-03 ENCOUNTER — Encounter: Payer: Medicare Other | Admitting: *Deleted

## 2017-11-03 VITALS — BP 108/64 | HR 65

## 2017-11-03 DIAGNOSIS — Z006 Encounter for examination for normal comparison and control in clinical research program: Secondary | ICD-10-CM

## 2017-11-03 MED ORDER — STUDY - AEGIS II STUDY - PLACEBO OR CSL112 (PI-HILTY)
170.0000 mL | INTRAVENOUS | Status: DC
  Administered 2017-11-03: 08:00:00 170 mL via INTRAVENOUS
  Filled 2017-11-03: qty 170

## 2017-11-03 NOTE — Progress Notes (Signed)
Pt doing well. No complaints of cp or sob. Rash is gone and no more problems. Meds are the same. VS stable.   Current Outpatient Medications:  .  aspirin EC 81 MG tablet, Take 1 tablet (81 mg total) by mouth daily., Disp: 90 tablet, Rfl: 3 .  atorvastatin (LIPITOR) 80 MG tablet, TAKE 1 TABLET BY MOUTH AT BEDTIME (Patient taking differently: TAKE 1 TABLET (80mg ) BY MOUTH AT BEDTIME), Disp: 90 tablet, Rfl: 2 .  carvedilol (COREG) 25 MG tablet, TAKE 1 TABLET BY MOUTH TWICE DAILY WITH A MEAL (Patient taking differently: TAKE 1 TABLET (25mg ) BY MOUTH TWICE DAILY WITH A MEAL), Disp: 180 tablet, Rfl: 2 .  nitroGLYCERIN (NITROSTAT) 0.4 MG SL tablet, Place 1 tablet (0.4 mg total) under the tongue every 5 (five) minutes x 3 doses as needed for chest pain. IF NEEDED, Disp: 25 tablet, Rfl: 6 .  ramipril (ALTACE) 10 MG capsule, Take 10 mg by mouth 2 (two) times daily., Disp: , Rfl:  .  spironolactone (ALDACTONE) 25 MG tablet, Take 1 tablet (25 mg total) by mouth daily., Disp: 90 tablet, Rfl: 3 .  ticagrelor (BRILINTA) 90 MG TABS tablet, Take 1 tablet (90 mg total) by mouth 2 (two) times daily., Disp: 180 tablet, Rfl: 3 .  varenicline (CHANTIX CONTINUING MONTH PAK) 1 MG tablet, Take 1 tablet (1 mg total) by mouth 2 (two) times daily. Start after completing 1 week  Of 1/2 mg tablets. (Patient not taking: Reported on 10/28/2017), Disp: 60 tablet, Rfl: 1 .  varenicline (CHANTIX) 0.5 MG tablet, Take 1 tablet (0.5 mg total) by mouth 2 (two) times daily. (Patient not taking: Reported on 10/28/2017), Disp: 14 tablet, Rfl: 0 No current facility-administered medications for this visit.   Facility-Administered Medications Ordered in Other Visits:  .  AEGIS II Study - Placebo / CSL112 (PI-Hilty), 170 mL, Intravenous, Q7 days, Hilty, Lisette AbuKenneth C, MD                                     "CONSENT"   YES     NO   Continuing further Investigational Product and study visits for follow-up?      X     Continuing consent from future  biomedical research      X                                      "EVENTS"    YES     NO  AE   (IF YES SEE SOURCE)        X  SAE  (IF YES SEE SOURCE)        X  ENDPOINT   (IF YES SEE SOURCE)        X  REVASCULARIZATION  (IF YES SEE SOURCE)        X  AMPUTATION   (IF YES SEE SOURCE)        X  TROPONIN'S  (IF YES SEE SOURCE)        X   Central labs drawn today.

## 2017-11-04 ENCOUNTER — Other Ambulatory Visit: Payer: Self-pay | Admitting: Internal Medicine

## 2017-11-04 NOTE — Research (Signed)
Patient seen and examined.  Stable on exam.  Lungs clear.  Cardiac S4 gallop. Killip Class I.    Enrolled in AEGIS I.   Arturo Mortonhomas D. Riley KillStuckey ,MD, [facility] Hospital[facility]ACC

## 2017-11-07 ENCOUNTER — Encounter (HOSPITAL_COMMUNITY): Payer: Medicare Other

## 2017-11-10 ENCOUNTER — Encounter (HOSPITAL_COMMUNITY)
Admission: RE | Admit: 2017-11-10 | Discharge: 2017-11-10 | Disposition: A | Discharge status: Home / Self Care | Payer: Medicare Other | Source: Ambulatory Visit | Attending: Internal Medicine | Admitting: Internal Medicine

## 2017-11-10 ENCOUNTER — Encounter: Payer: Medicare Other | Admitting: *Deleted

## 2017-11-10 VITALS — BP 94/59 | HR 60

## 2017-11-10 DIAGNOSIS — Z006 Encounter for examination for normal comparison and control in clinical research program: Secondary | ICD-10-CM

## 2017-11-10 MED ORDER — STUDY - AEGIS II STUDY - PLACEBO OR CSL112 (PI-HILTY)
170.0000 mL | INTRAVENOUS | Status: DC
  Administered 2017-11-10: 08:00:00 170 mL via INTRAVENOUS
  Filled 2017-11-10: qty 170

## 2017-11-10 NOTE — Progress Notes (Signed)
Pt doing well. No complaints of cp or sob.    Current Outpatient Medications:  .  aspirin EC 81 MG tablet, Take 1 tablet (81 mg total) by mouth daily., Disp: 90 tablet, Rfl: 3 .  atorvastatin (LIPITOR) 80 MG tablet, TAKE 1 TABLET BY MOUTH AT BEDTIME (Patient taking differently: TAKE 1 TABLET (80mg ) BY MOUTH AT BEDTIME), Disp: 90 tablet, Rfl: 2 .  carvedilol (COREG) 25 MG tablet, TAKE 1 TABLET BY MOUTH TWICE DAILY WITH A MEAL (Patient taking differently: TAKE 1 TABLET (25mg ) BY MOUTH TWICE DAILY WITH A MEAL), Disp: 180 tablet, Rfl: 2 .  nitroGLYCERIN (NITROSTAT) 0.4 MG SL tablet, Place 1 tablet (0.4 mg total) under the tongue every 5 (five) minutes x 3 doses as needed for chest pain. IF NEEDED, Disp: 25 tablet, Rfl: 6 .  ramipril (ALTACE) 10 MG capsule, Take 10 mg by mouth 2 (two) times daily., Disp: , Rfl:  .  spironolactone (ALDACTONE) 25 MG tablet, Take 1 tablet (25 mg total) by mouth daily., Disp: 90 tablet, Rfl: 3 .  ticagrelor (BRILINTA) 90 MG TABS tablet, Take 1 tablet (90 mg total) by mouth 2 (two) times daily., Disp: 180 tablet, Rfl: 3 .  varenicline (CHANTIX CONTINUING MONTH PAK) 1 MG tablet, Take 1 tablet (1 mg total) by mouth 2 (two) times daily. Start after completing 1 week  Of 1/2 mg tablets. (Patient not taking: Reported on 11/10/2017), Disp: 60 tablet, Rfl: 1 .  varenicline (CHANTIX) 0.5 MG tablet, Take 1 tablet (0.5 mg total) by mouth 2 (two) times daily. (Patient not taking: Reported on 11/10/2017), Disp: 14 tablet, Rfl: 0 No current facility-administered medications for this visit.   Facility-Administered Medications Ordered in Other Visits:  .  AEGIS II Study - Placebo / CSL112 (PI-Hilty), 170 mL, Intravenous, Q7 days, Hilty, Lisette AbuKenneth C, MD, 170 mL at 11/10/17 [id number]                                     "CONSENT"   YES     NO   Continuing further Investigational Product and study visits for follow-up?      X     Continuing consent from future biomedical research      X                                     "EVENTS"    YES     NO  AE   (IF YES SEE SOURCE)       X   SAE  (IF YES SEE SOURCE)       X  ENDPOINT   (IF YES SEE SOURCE)       X  REVASCULARIZATION  (IF YES SEE SOURCE)       X  AMPUTATION   (IF YES SEE SOURCE)       X  TROPONIN'S  (IF YES SEE SOURCE)       X    Central labs drawn: PK only drawn at today's visit

## 2017-11-11 NOTE — Research (Signed)
Visit 1 and 2  Index   DEMOGRAPHICS:  Patient Name: Billy FinlandRobert [name] Birth Date: 08/07/1956  Sex: Male  Race: white  Child Bearing: ? Yes    ? No ? Tubial ligation ? Hysterectomy  ? postmenopausal   Height: 170 cm Weight: 73 kg   Index Procedure:  Onset date of symptoms: 19-Feb-19 Onset of symptoms: 2100  Date of First contact at hospital: 20-Feb-19 Time of first contact at hospital: 2159  Admission Date: 20-Feb-19   Discharge Date: 22-Feb-19 Discharge Time: 1400   Vital Signs: Date 10/14/2017    Time: 2159 BP: 162/98  Pulse: 73    Concomitant medications: Every visit: ? See med sheet  BMP Pre Contrast IV 0.97  CMP Post Contrast IV 12 hours later: 0.78 Hepatic Panel:  ALT: 29 Total Bili: 1.1 Direct Bili: 0.1   Medical History:  ? CAD ? Prior MI ? PAD  ? History of Heart Failure ? Moderate to severe valvular dx ? AFib  ? Prior Coronary Revascularization  if YES please select Yes or No below:  CABG ? Yes   ? No           PCI with stent ? Yes   ? No          PCI without stent ? Yes ? No  ? CVA if checked please select one of the following Choose an item.  ? Hypertension  ? Gilberts syndrome ? CKD   ? Hypocholesteremia ? DM ? Smoker        ? eCigarette  Killip Class Stage 1     EQ-5D-3L ?  Future Biomedical Research: Consented ? Yes     ? No If no please date they withdrew consent from biomedical research Click or tap to enter a date.  Central Labs Before Start of Infusion: ? Biochemistry panel      ? Hematology     ? Immunogenicity   (30 mins before infusion)   ? Parvovirus  ? FBR sample ? PK/PD sample Central Labs End of Infusion:   ? PK/PD Central Blood Draw Time: Before SOI: _02/21/19 @ 1015___ After EOI: _02/21/19 @ 1230___ Infusion Start Time: 10/16/2017 10:22 AM  Infusion End Time: 10/16/2017 12:25 PM

## 2017-11-14 ENCOUNTER — Ambulatory Visit (INDEPENDENT_AMBULATORY_CARE_PROVIDER_SITE_OTHER): Payer: Medicare Other

## 2017-11-14 ENCOUNTER — Telehealth: Payer: Self-pay

## 2017-11-14 DIAGNOSIS — Z95810 Presence of automatic (implantable) cardiac defibrillator: Secondary | ICD-10-CM | POA: Diagnosis not present

## 2017-11-14 DIAGNOSIS — I5022 Chronic systolic (congestive) heart failure: Secondary | ICD-10-CM | POA: Diagnosis not present

## 2017-11-14 NOTE — Progress Notes (Signed)
EPIC Encounter for ICM Monitoring  Patient Name: Billy JacquetRobert E Huckaba is a 62 y.o. male Date: 11/14/2017 Primary Care Physican: Adrian PrinceSouth, Stephen, MD Primary Cardiologist:Jordan Electrophysiologist: Allred Dry Weight:unknown        Attempted call to patient and unable to reach.  Left detailed message regarding transmission.  Transmission reviewed.    Thoracic impedance normal.  No diuretic.   Recommendations: Left voice mail with ICM number and encouraged to call if experiencing any fluid symptoms.  Follow-up plan: ICM clinic phone appointment on 12/15/2017.    Copy of ICM check sent to Dr. Johney FrameAllred.   3 month ICM trend: 11/14/2017    1 Year ICM trend:       Karie SodaLaurie S Damali Broadfoot, RN 11/14/2017 10:08 AM

## 2017-11-14 NOTE — Telephone Encounter (Signed)
Remote ICM transmission received.  Attempted call to patient and left detailed message per DPR regarding transmission and next ICM scheduled for 12/15/2017.  Advised to return call for any fluid symptoms or questions.   

## 2017-11-17 ENCOUNTER — Encounter: Payer: Medicare Other | Admitting: *Deleted

## 2017-11-17 VITALS — BP 97/62 | HR 60 | Resp 14 | Wt 168.6 lb

## 2017-11-17 DIAGNOSIS — Z006 Encounter for examination for normal comparison and control in clinical research program: Secondary | ICD-10-CM

## 2017-11-17 NOTE — Progress Notes (Signed)
Visit 6  Pt doing well. No chest pains or shortness of breath. All meds are the same.    Current Outpatient Medications:  .  aspirin EC 81 MG tablet, Take 1 tablet (81 mg total) by mouth daily., Disp: 90 tablet, Rfl: 3 .  atorvastatin (LIPITOR) 80 MG tablet, TAKE 1 TABLET BY MOUTH AT BEDTIME (Patient taking differently: TAKE 1 TABLET (80mg ) BY MOUTH AT BEDTIME), Disp: 90 tablet, Rfl: 2 .  carvedilol (COREG) 25 MG tablet, TAKE 1 TABLET BY MOUTH TWICE DAILY WITH A MEAL (Patient taking differently: TAKE 1 TABLET (25mg ) BY MOUTH TWICE DAILY WITH A MEAL), Disp: 180 tablet, Rfl: 2 .  nitroGLYCERIN (NITROSTAT) 0.4 MG SL tablet, Place 1 tablet (0.4 mg total) under the tongue every 5 (five) minutes x 3 doses as needed for chest pain. IF NEEDED, Disp: 25 tablet, Rfl: 6 .  ramipril (ALTACE) 10 MG capsule, Take 10 mg by mouth 2 (two) times daily., Disp: , Rfl:  .  spironolactone (ALDACTONE) 25 MG tablet, Take 1 tablet (25 mg total) by mouth daily., Disp: 90 tablet, Rfl: 3 .  ticagrelor (BRILINTA) 90 MG TABS tablet, Take 1 tablet (90 mg total) by mouth 2 (two) times daily., Disp: 180 tablet, Rfl: 3 .  varenicline (CHANTIX CONTINUING MONTH PAK) 1 MG tablet, Take 1 tablet (1 mg total) by mouth 2 (two) times daily. Start after completing 1 week  Of 1/2 mg tablets. (Patient not taking: Reported on 11/10/2017), Disp: 60 tablet, Rfl: 1 .  varenicline (CHANTIX) 0.5 MG tablet, Take 1 tablet (0.5 mg total) by mouth 2 (two) times daily. (Patient not taking: Reported on 11/10/2017), Disp: 14 tablet, Rfl: 0  Central labs drawn today:                                     "CONSENT"   YES     NO   Continuing further Investigational Product and study visits for follow-up?      X     Continuing consent from future biomedical research      X                                      "EVENTS"    YES     NO  AE   (IF YES SEE SOURCE)        X  SAE  (IF YES SEE SOURCE)        X  ENDPOINT   (IF YES SEE SOURCE)        X    REVASCULARIZATION  (IF YES SEE SOURCE)        X  AMPUTATION   (IF YES SEE SOURCE)        X  TROPONIN'S  (IF YES SEE SOURCE)        X

## 2017-11-18 NOTE — Addendum Note (Signed)
Encounter addended by: Claudean Kindseis, Melrose Kearse G, RN on: 11/18/2017 9:44 AM  Actions taken: Charge Capture section accepted

## 2017-11-24 NOTE — Research (Signed)
Visit 2 PK draw

## 2017-12-03 NOTE — Progress Notes (Signed)
Visit 5 PK drawn

## 2017-12-18 ENCOUNTER — Ambulatory Visit (INDEPENDENT_AMBULATORY_CARE_PROVIDER_SITE_OTHER): Payer: Medicare Other | Admitting: *Deleted

## 2017-12-18 DIAGNOSIS — I5022 Chronic systolic (congestive) heart failure: Secondary | ICD-10-CM | POA: Diagnosis not present

## 2017-12-18 DIAGNOSIS — Z95810 Presence of automatic (implantable) cardiac defibrillator: Secondary | ICD-10-CM | POA: Diagnosis not present

## 2017-12-18 DIAGNOSIS — I4729 Other ventricular tachycardia: Secondary | ICD-10-CM

## 2017-12-18 DIAGNOSIS — I472 Ventricular tachycardia: Secondary | ICD-10-CM

## 2017-12-18 NOTE — Progress Notes (Signed)
EPIC Encounter for ICM Monitoring  Patient Name: Billy JacquetRobert E [name] is a 62 y.o. male Date: 12/18/2017 Primary Care Physican: Adrian PrinceSouth, Stephen, MD Primary Cardiologist:Jordan Electrophysiologist: Allred Dry Weight:unknown          Spoke with wife, patient not home. Heart Failure questions reviewed, pt asymptomatic.   Thoracic impedance normal.  No diuretic.   Recommendations: No changes.    Follow-up plan: ICM clinic phone appointment on 01/20/2018.    Copy of ICM check sent to Dr. Johney FrameAllred.   3 month ICM trend: 12/18/2017    1 Year ICM trend:       Karie SodaLaurie S Jaleigha Deane, RN 12/18/2017 4:51 PM

## 2017-12-19 ENCOUNTER — Encounter: Payer: Self-pay | Admitting: Cardiology

## 2017-12-19 NOTE — Progress Notes (Signed)
Remote ICD transmission.   

## 2017-12-23 ENCOUNTER — Encounter: Payer: Self-pay | Admitting: *Deleted

## 2017-12-23 DIAGNOSIS — Z006 Encounter for examination for normal comparison and control in clinical research program: Secondary | ICD-10-CM

## 2017-12-25 NOTE — Progress Notes (Signed)
Late Entry Spoke with patient.  Doing well. No complaints of cp or sob.  All meds are the same.  Will see him back in clinic for visit 8 middle of may beginning of June.    Current Outpatient Medications:  .  aspirin EC 81 MG tablet, Take 1 tablet (81 mg total) by mouth daily., Disp: 90 tablet, Rfl: 3 .  atorvastatin (LIPITOR) 80 MG tablet, TAKE 1 TABLET BY MOUTH AT BEDTIME (Patient taking differently: TAKE 1 TABLET ( ) BY MOUTH AT BEDTIME), Disp: 90 tablet, Rfl: 2 .  carvedilol (COREG) 25 MG tablet, TAKE 1 TABLET BY MOUTH TWICE DAILY WITH A MEAL (Patient taking differently: TAKE 1 TABLET ( ) BY MOUTH TWICE DAILY WITH A MEAL), Disp: 180 tablet, Rfl: 2 .  nitroGLYCERIN (NITROSTAT) 0.4 MG SL tablet, Place 1 tablet (0.4 mg total) under the tongue every 5 (five) minutes x 3 doses as needed for chest pain. IF NEEDED, Disp: 25 tablet, Rfl: 6 .  ramipril (ALTACE) 10 MG capsule, Take 10 mg by mouth 2 (two) times daily., Disp: , Rfl:  .  spironolactone (ALDACTONE) 25 MG tablet, Take 1 tablet (25 mg total) by mouth daily., Disp: 90 tablet, Rfl: 3 .  ticagrelor (BRILINTA) 90 MG TABS tablet, Take 1 tablet (90 mg total) by mouth 2 (two) times daily., Disp: 180 tablet, Rfl: 3 .  varenicline (CHANTIX CONTINUING MONTH PAK) 1 MG tablet, Take 1 tablet (1 mg total) by mouth 2 (two) times daily. Start after completing 1 week  Of 1/2 mg tablets. (Patient not taking: Reported on 12/25/2017), Disp: 60 tablet, Rfl: 1 .  varenicline (CHANTIX) 0.5 MG tablet, Take 1 tablet (0.5 mg total) by mouth 2 (two) times daily. (Patient not taking: Reported on 12/25/2017), Disp: 14 tablet, Rfl: 0                                     "CONSENT"   YES     NO   Continuing further Investigational Product and study visits for follow-up?    Continuing consent from future biomedical research                                       "EVENTS"    YES     NO  AE   (IF YES SEE SOURCE)    SAE  (IF YES SEE SOURCE)     ENDPOINT   (IF YES SEE SOURCE)    REVASCULARIZATION  (IF YES SEE SOURCE)    AMPUTATION   (IF YES SEE SOURCE)    TROPONIN'S  (IF YES SEE SOURCE)  

## 2018-01-09 LAB — CUP PACEART REMOTE DEVICE CHECK
Battery Remaining Longevity: 51 mo
Battery Voltage: 2.98 V
Brady Statistic AP VP Percent: 0 %
Brady Statistic AS VP Percent: 0 %
Brady Statistic RA Percent Paced: 96.59 %
Date Time Interrogation Session: 20190425073527
HighPow Impedance: 64 Ohm
Implantable Lead Implant Date: 20100414
Implantable Lead Location: 753859
Implantable Lead Serial Number: 323897
Implantable Pulse Generator Implant Date: 20140711
Lead Channel Impedance Value: 323 Ohm
Lead Channel Pacing Threshold Amplitude: 1.25 V
Lead Channel Pacing Threshold Amplitude: 1.875 V
Lead Channel Pacing Threshold Pulse Width: 0.4 ms
Lead Channel Sensing Intrinsic Amplitude: 1.375 mV
Lead Channel Sensing Intrinsic Amplitude: 5.375 mV
Lead Channel Setting Pacing Amplitude: 2.5 V
Lead Channel Setting Pacing Amplitude: 2.5 V
MDC IDC LEAD IMPLANT DT: 20140711
MDC IDC LEAD LOCATION: 753860
MDC IDC MSMT LEADCHNL RA IMPEDANCE VALUE: 456 Ohm
MDC IDC MSMT LEADCHNL RA SENSING INTR AMPL: 1.375 mV
MDC IDC MSMT LEADCHNL RV IMPEDANCE VALUE: 304 Ohm
MDC IDC MSMT LEADCHNL RV PACING THRESHOLD PULSEWIDTH: 0.4 ms
MDC IDC MSMT LEADCHNL RV SENSING INTR AMPL: 5.375 mV
MDC IDC SET LEADCHNL RV PACING PULSEWIDTH: 0.4 ms
MDC IDC SET LEADCHNL RV SENSING SENSITIVITY: 0.3 mV
MDC IDC STAT BRADY AP VS PERCENT: 96.66 %
MDC IDC STAT BRADY AS VS PERCENT: 3.33 %
MDC IDC STAT BRADY RV PERCENT PACED: 0 %

## 2018-01-20 ENCOUNTER — Telehealth: Payer: Self-pay

## 2018-01-20 ENCOUNTER — Ambulatory Visit (INDEPENDENT_AMBULATORY_CARE_PROVIDER_SITE_OTHER): Payer: Medicare Other

## 2018-01-20 DIAGNOSIS — I5022 Chronic systolic (congestive) heart failure: Secondary | ICD-10-CM

## 2018-01-20 DIAGNOSIS — Z95810 Presence of automatic (implantable) cardiac defibrillator: Secondary | ICD-10-CM | POA: Diagnosis not present

## 2018-01-20 NOTE — Telephone Encounter (Signed)
Remote ICM transmission received.  Attempted call to patient and left detailed message, per DPR, regarding transmission and next ICM scheduled for 02/20/2018.  Advised to return call for any fluid symptoms or questions.    

## 2018-01-20 NOTE — Progress Notes (Signed)
EPIC Encounter for ICM Monitoring  Patient Name: [name] is a 62 y.o. male Date: 01/20/2018 Primary Care Physican: Adrian Prince, MD Primary Cardiologist:Jordan Electrophysiologist: Allred Dry Weight:unknown        Attempted call to patient and unable to reach.  Left detailed message, per DPR, regarding transmission.  Transmission reviewed.    Thoracic impedance slightly abnormal since 01/14/2018 suggesting fluid accumulation.  No diuretic.   Recommendations: Left voice mail with ICM number. Advised to limit salt intake and encouraged to call if experiencing any fluid symptoms.  Follow-up plan: ICM clinic phone appointment on 02/20/2018.   Copy of ICM check sent to Dr. Johney Frame.   3 month ICM trend: 01/20/2018    1 Year ICM trend:       Karie Soda, RN 01/20/2018 2:37 PM

## 2018-01-21 ENCOUNTER — Encounter: Payer: Medicare Other | Admitting: *Deleted

## 2018-01-21 VITALS — BP 102/70 | HR 86 | Resp 14 | Wt 170.0 lb

## 2018-01-21 DIAGNOSIS — Z006 Encounter for examination for normal comparison and control in clinical research program: Secondary | ICD-10-CM

## 2018-01-21 NOTE — Progress Notes (Signed)
Visit 8  Pt here today feeling great. No chest pains or sob.                                      "CONSENT"   YES     NO   Continuing further Investigational Product and study visits for follow-up?    Continuing consent from future biomedical research                                    "EVENTS"    YES     NO  AE   (IF YES SEE SOURCE)    SAE  (IF YES SEE SOURCE)    ENDPOINT   (IF YES SEE SOURCE)    REVASCULARIZATION  (IF YES SEE SOURCE)    AMPUTATION   (IF YES SEE SOURCE)    TROPONIN'S  (IF YES SEE SOURCE)     Health Questionnaire  English version for the [place]    By placing a checkmark in one box in each group below, please indicate which statements best describe your own health state today.     Mobility   I have no problems in walking about X  I have some problems in walking about ?  I am confined to bed ?     Self-Care   I have no problems with self-care X  I have some problems washing or dressing myself ?  I am unable to wash or dress myself ?     Usual Activities (e.g. work, study, housework, family or leisure activities)   I have no problems with performing my usual activities X  I have some problems with performing my usual activities ?  I am unable to perform my usual activities ?     Pain / Discomfort   I have no pain or discomfort X  I have moderate pain or discomfort ?  I have extreme pain or discomfort ?     Anxiety / Depression   I am not anxious or depressed X  I am moderately anxious or depressed ?  I am extremely anxious or depressed ?   To help people say how good or bad a health state is, we have drawn a scale (rather like a thermometer) on which the best state you can imagine is marked 100 and the worst state you can imagine is marked 0.    We would like you to indicate on this scale how good or bad your own health is today, in your opinion. Please do this by drawing a line from the box below to whichever point  on the scale indicates how good or bad your health state is today.   80    Lifestyle Adherence Assessment:    YES NO  Abstinence from smoking/remaining tobacco free X   Cardiac Diet X   Routine physical activity and/or cardiac rehabilitation X     Current Outpatient Medications:  .  aspirin EC 81 MG tablet, Take 1 tablet (81 mg total) by mouth daily., Disp: 90 tablet, Rfl: 3 .  atorvastatin (LIPITOR) 80 MG tablet, TAKE 1 TABLET BY MOUTH AT BEDTIME (Patient taking differently: TAKE 1 TABLET ( ) BY MOUTH AT BEDTIME), Disp: 90 tablet, Rfl: 2 .  carvedilol (COREG) 25 MG tablet, TAKE 1 TABLET BY MOUTH TWICE DAILY WITH A MEAL (Patient  taking differently: TAKE 1 TABLET ( ) BY MOUTH TWICE DAILY WITH A MEAL), Disp: 180 tablet, Rfl: 2 .  nitroGLYCERIN (NITROSTAT) 0.4 MG SL tablet, Place 1 tablet (0.4 mg total) under the tongue every 5 (five) minutes x 3 doses as needed for chest pain. IF NEEDED, Disp: 25 tablet, Rfl: 6 .  ramipril (ALTACE) 10 MG capsule, Take 10 mg by mouth 2 (two) times daily., Disp: , Rfl:  .  spironolactone (ALDACTONE) 25 MG tablet, Take 1 tablet (25 mg total) by mouth daily., Disp: 90 tablet, Rfl: 3 .  ticagrelor (BRILINTA) 90 MG TABS tablet, Take 1 tablet (90 mg total) by mouth 2 (two) times daily., Disp: 180 tablet, Rfl: 3 .  varenicline (CHANTIX CONTINUING MONTH PAK) 1 MG tablet, Take 1 tablet (1 mg total) by mouth 2 (two) times daily. Start after completing 1 week  Of 1/2 mg tablets. (Patient not taking: Reported on 12/25/2017), Disp: 60 tablet, Rfl: 1 .  varenicline (CHANTIX) 0.5 MG tablet, Take 1 tablet (0.5 mg total) by mouth 2 (two) times daily. (Patient not taking: Reported on 12/25/2017), Disp: 14 tablet, Rfl: 0

## 2018-01-21 NOTE — Progress Notes (Addendum)
AEGIS VERSION 2.0 Informed Consent   Subject met inclusion and exclusion criteria.  The informed consent form, study requirements and expectations were reviewed with the subject and questions and concerns were addressed prior to the signing of the consent form.  The subject verbalized understanding of the trial requirements.  The subject agreed to participate in the AEGIS trial and signed the informed consent at [place] on 01/21/18.  The informed consent was obtained prior to performance of any protocol-specific procedures for the subject.  A copy of the signed informed consent was given to the subject and a copy was placed in the subject's medical record.  Local IRB approved 11/11/17 Version 2.0 [name] D

## 2018-02-17 ENCOUNTER — Other Ambulatory Visit: Payer: Self-pay

## 2018-02-17 ENCOUNTER — Emergency Department (HOSPITAL_COMMUNITY)
Admission: EM | Admit: 2018-02-17 | Discharge: 2018-02-17 | Disposition: A | Discharge status: Home / Self Care | Payer: Medicare Other | Attending: Emergency Medicine | Admitting: Emergency Medicine

## 2018-02-17 ENCOUNTER — Encounter (HOSPITAL_COMMUNITY): Payer: Self-pay | Admitting: Emergency Medicine

## 2018-02-17 DIAGNOSIS — L03116 Cellulitis of left lower limb: Secondary | ICD-10-CM | POA: Insufficient documentation

## 2018-02-17 DIAGNOSIS — Z87891 Personal history of nicotine dependence: Secondary | ICD-10-CM | POA: Diagnosis not present

## 2018-02-17 DIAGNOSIS — I5022 Chronic systolic (congestive) heart failure: Secondary | ICD-10-CM | POA: Diagnosis not present

## 2018-02-17 DIAGNOSIS — I2510 Atherosclerotic heart disease of native coronary artery without angina pectoris: Secondary | ICD-10-CM | POA: Insufficient documentation

## 2018-02-17 DIAGNOSIS — Z951 Presence of aortocoronary bypass graft: Secondary | ICD-10-CM | POA: Insufficient documentation

## 2018-02-17 DIAGNOSIS — I110 Hypertensive heart disease with heart failure: Secondary | ICD-10-CM | POA: Diagnosis not present

## 2018-02-17 DIAGNOSIS — I252 Old myocardial infarction: Secondary | ICD-10-CM | POA: Insufficient documentation

## 2018-02-17 DIAGNOSIS — R2242 Localized swelling, mass and lump, left lower limb: Secondary | ICD-10-CM | POA: Diagnosis present

## 2018-02-17 MED ORDER — SULFAMETHOXAZOLE-TRIMETHOPRIM 800-160 MG PO TABS
1.0000 | ORAL_TABLET | Freq: Once | ORAL | Status: AC
  Administered 2018-02-17: 1 via ORAL
  Filled 2018-02-17: qty 1

## 2018-02-17 MED ORDER — SULFAMETHOXAZOLE-TRIMETHOPRIM 800-160 MG PO TABS: 1.0000 | ORAL_TABLET | Freq: Two times a day (BID) | ORAL | 0 refills | Status: AC

## 2018-02-17 NOTE — ED Provider Notes (Signed)
Emergency Department Provider Note   I have reviewed the triage vital signs and the nursing notes.   HISTORY  Chief Complaint Cellulitis   HPI [name] is a 62 y.o. male with multiple medical problems as documented below the presents the emergency department today with erythema to the top of his left foot.  Is been there about 3 days after an insect bite.  States he is tried ice that has helped the symptoms.  The redness just continues to get worse.  He had one episode of nausea yesterday but no persistent nausea, fever, vomiting or other systemic symptoms.  Has not draining anything from it.  No sick contacts.  Never anything as before.  Has full feeling and states it hurts a little bit but not too bad. No other associated or modifying symptoms.    Past Medical History:  Diagnosis Date  . Acute anterior myocardial infarction (HCC) 09/02/2008   post emergent stenting of the LAD in January 2010 -- anterior ST-elevation myocardial infarction  . Adrenal mass (HCC)    currently being evaluated -- 4.1 cm right adrenal mass  . AICD (automatic cardioverter/defibrillator) present 02/2013   Medtronic   . Anemia   . CAD (coronary artery disease)    2000-STEMI, DES to LAD, 1wk later VF arrest d/t 50% ISR, 2012- CABG X3, 10/16/17 STEMI with VT-, LHC patnet graft, DES to 1st OM & Circ  . Cardiac arrest - ventricular fibrillation 09/02/2008   Out of hospital cardiac arrest secondary to arrhythmia  . CHF NYHA class II (HCC)     New York Heart Association class II/III heart failure  . Ejection fraction < 50%    30-35%, 10/16/17- post STEMI EF 25-30%  . History of methicillin resistant staphylococcus aureus (MRSA)    History of methicillin-sensitive Staphylococcus aureus bacteremia  with implantable cardioverter-defibrillator explant, February 23, 2009  . HL (hearing loss)   . Hyperlipidemia   . Hypertension   . Ischemic cardiomyopathy    severe ischemic cardiomyopathy -- ejection  fraction 30-35%  . NSTEMI (non-ST elevated myocardial infarction) (HCC) 10/14/2017   [name] 10/15/2017  . Pericarditis    history of acute pericarditis in July 2010 now resolved  . Polymorphic ventricular tachycardia (HCC)    with VF arrest, s/p subsequent ICD  . S/P CABG (coronary artery bypass graft) Sept 2012  . STEMI (ST elevation myocardial infarction) (HCC) 2000   [name] 10/15/2017    Patient Active Problem List   Diagnosis Date Noted  . Drug-induced skin rash 10/24/2017  . NSTEMI (non-ST elevated myocardial infarction) (HCC) 10/15/2017  . Acute myocardial infarction, subendocardial infarction, initial episode of care (HCC) 03/04/2013  . ICD-Medtronic 06/24/2012  . S/P CABG x 3 05/31/2011  . Adrenal mass (HCC)   . METHICILLIN SUSCEPTIBLE STAPH AUREUS SEPTICEMIA 04/27/2009  . HYPERCHOLESTEROLEMIA 12/14/2008  . HYPOMAGNESEMIA 12/14/2008  . HYPOKALEMIA 12/14/2008  . TOBACCO ABUSE 12/14/2008  . ENCEPHALOPATHY, ANOXIC 12/14/2008  . HYPERTENSION 12/14/2008  . CAD (coronary artery disease) 12/14/2008  . VENTRICULAR TACHYCARDIA 12/14/2008  . Cardiac arrest (HCC) 12/14/2008  . CHF 12/14/2008  . Chronic systolic CHF (congestive heart failure) (HCC) 12/14/2008  . UNSPECIFIED CARDIOVASCULAR DISEASE 12/14/2008    Past Surgical History:  Procedure Laterality Date  . ANKLE SURGERY    . CARDIAC CATHETERIZATION  08/26/2008   Est. EF of 45% -- Successful intracoronary stenting of the mid-LAD -- Three-vessel atherosclerotic coronary artery disease.  The patient has occlusion of the mid LAD, which is his culprit  lesion.  He has  moderate disease in the proximal circumflex and PDA -- Moderate left ventricular dysfunction -- [name], [name]  . CARDIAC CATHETERIZATION  03/16/2009    EF was approximately 30% -- Atherosclerotic coronary vascular disease, three-vessel --  Moderate-to-severe ischemic cardiomyopathy -- No clear evidence for an acute culprit lesion that would explain the  patient's chest pain -- [name], [name].   . CARDIAC CATHETERIZATION  10/15/2017  . CORONARY ARTERY BYPASS GRAFT  05/07/2011   CABG x3:  LIMA to LAD, SVG to OM2, SVG to PDA  . CORONARY STENT INTERVENTION N/A 10/15/2017   Procedure: CORONARY STENT INTERVENTION;  Surgeon: Swaziland, Peter M, MD;  Location: [facility] INVASIVE CV LAB;  Service: Cardiovascular;  Laterality: N/A;  . ICD implant  02/23/2009    with subsequent extraction for MSSA bacteremia  . ICD reimplantation  05/09/2009   status post implant of a Medtronic Maximo II DR dual- chamber cardioverter defibrillator by Dr. Hillis Range  . IMPLANTABLE CARDIOVERTER DEFIBRILLATOR REVISION  03/05/13   RV lead fracture.  A new [employer] [id number] lead was placed  . LEAD REVISION Left 03/05/2013   Procedure: LEAD REVISION;  Surgeon: Hillis Range, MD;  Location[facility] CATH LAB;  Service: Cardiovascular;  Laterality: Left;  . LEFT HEART CATH AND CORS/GRAFTS ANGIOGRAPHY N/A 10/15/2017   Procedure: LEFT HEART CATH AND CORS/GRAFTS ANGIOGRAPHY;  Surgeon: Swaziland, Peter M, MD;  Location: [facility] INVASIVE CV LAB;  Service: Cardiovascular;  Laterality: N/A;    Current Outpatient Rx  . Order #: [id number] Class: No Print  . Order #: [id number] Class: Normal  . Order #: [id number] Class: Normal  . Order #: [id number] Class: Normal  . Order #: [id number] Class: Historical Med  . Order #: [id number] Class: Normal  . Order #: [id number] Class: Print  . Order #: [id number] Class: Normal  . Order #: [id number] Class: Normal  . Order #: [id number] Class: Normal    Allergies Sacubitril-valsartan and Morphine and related  Family History  Problem Relation Age of Onset  . Stroke Mother   . Coronary artery disease Father     Social History Social History   Tobacco Use  . Smoking status: Former Smoker    Years: 37.00    Types: Cigarettes    Last attempt to quit: 10/14/2017    Years since quitting: 0.3  . Smokeless tobacco: Never Used  . Tobacco comment: 1 pack per week. Has  been smoking for 35 years.   Substance Use Topics  . Alcohol use: No  . Drug use: No    Review of Systems  All other systems negative except as documented in the HPI. All pertinent positives and negatives as reviewed in the HPI. ____________________________________________   PHYSICAL EXAM:  VITAL SIGNS: ED Triage Vitals  Enc Vitals Group     BP 02/17/18 1820 113/77     Pulse Rate 02/17/18 1820 79     Resp 02/17/18 1820 20     Temp 02/17/18 1820 98.4 F (36.9 C)     Temp Source 02/17/18 1820 Temporal     SpO2 02/17/18 1820 100 %     Weight 02/17/18 1820 170 lb (77.1 kg)     Height 02/17/18 1820 5\' 7"  (1.702 m)    Constitutional: Alert and oriented. Well appearing and in no acute distress. Eyes: Conjunctivae are normal. PERRL. EOMI. Head: Atraumatic. Nose: No congestion/rhinnorhea. Mouth/Throat: Mucous membranes are moist.  Oropharynx non-erythematous. Neck: No stridor.  No meningeal signs.   Cardiovascular: Normal rate, regular rhythm. Good peripheral circulation. Grossly  normal heart sounds.   Respiratory: Normal respiratory effort.  No retractions. Lungs CTAB. Gastrointestinal: Soft and nontender. No distention.  Musculoskeletal: No lower extremity tenderness nor edema. No gross deformities of extremities. Neurologic:  Normal speech and language. No gross focal neurologic deficits are appreciated.  Skin: see picture below for description of rash on left foot. Rest of skin exam normal.  Images contained in this chart were taken by me using Haiku application on my iPhone after verbal consent from the patient. No images were stored in any way on my personal device.     ___________________________________________   INITIAL IMPRESSION / ASSESSMENT AND PLAN / ED COURSE  No indication for labs as the patient is not septic.  Will start Bactrim for likely cellulitis.  No evidence of abscess.  Low suspicion for intra-articular infection as he has range of motion without much  pain.  Return precautions given.     Pertinent labs & imaging results that were available during my care of the patient were reviewed by me and considered in my medical decision making (see chart for details).  ____________________________________________  FINAL CLINICAL IMPRESSION(S) / ED DIAGNOSES  Final diagnoses:  Cellulitis of left lower extremity     MEDICATIONS GIVEN DURING THIS VISIT:  Medications  sulfamethoxazole-trimethoprim (BACTRIM DS,SEPTRA DS) 800-160 MG per tablet 1 tablet (has no administration in time range)     NEW OUTPATIENT MEDICATIONS STARTED DURING THIS VISIT:  New Prescriptions   SULFAMETHOXAZOLE-TRIMETHOPRIM (BACTRIM DS,SEPTRA DS) 800-160 MG TABLET    Take 1 tablet by mouth 2 (two) times daily for 10 days.    Note:  This note was prepared with assistance of Dragon voice recognition software. Occasional wrong-word or sound-a-like substitutions may have occurred due to the inherent limitations of voice recognition software.   Marily MemosMesner, Wilmer Santillo, MD 02/17/18 [phone]

## 2018-02-17 NOTE — ED Triage Notes (Signed)
Patient complains of left foot pain. Patient has red swollen area to left ankle x 5 days.

## 2018-02-20 ENCOUNTER — Ambulatory Visit (INDEPENDENT_AMBULATORY_CARE_PROVIDER_SITE_OTHER): Payer: Medicare Other

## 2018-02-20 ENCOUNTER — Telehealth: Payer: Self-pay

## 2018-02-20 DIAGNOSIS — Z95810 Presence of automatic (implantable) cardiac defibrillator: Secondary | ICD-10-CM

## 2018-02-20 DIAGNOSIS — I5022 Chronic systolic (congestive) heart failure: Secondary | ICD-10-CM

## 2018-02-20 NOTE — Telephone Encounter (Signed)
Remote ICM transmission received.  Attempted call to patient and left detailed message, per DPR, regarding transmission and next ICM scheduled for 03/26/2018.  Advised to return call for any fluid symptoms or questions.    

## 2018-02-20 NOTE — Progress Notes (Signed)
EPIC Encounter for ICM Monitoring  Patient Name: Suezanne JacquetRobert E Mette is a 62 y.o. male Date: 02/20/2018 Primary Care Physican: Adrian PrinceSouth, Stephen, MD Primary Cardiologist:Jordan Electrophysiologist: Allred Dry Weight:unknown       Attempted call to patient and unable to reach.  Left detailed message, per DPR, regarding transmission.  Transmission reviewed.    Thoracic impedance normal.  No diuretic.   Recommendations: Left voice mail with ICM number and encouraged to call if experiencing any fluid symptoms.  Follow-up plan: ICM clinic phone appointment on 03/26/2018.   Recall appt 02/07/2018 with Dr. SwazilandJordan and 04/20/2018 with Gypsy BalsamAmber Seiler, NP.  Copy of ICM check sent to Dr. Johney FrameAllred.   3 month ICM trend: 02/20/2018    1 Year ICM trend:       Karie SodaLaurie S Short, RN 02/20/2018 8:03 AM

## 2018-03-08 ENCOUNTER — Other Ambulatory Visit: Payer: Self-pay | Admitting: Nurse Practitioner

## 2018-03-08 DIAGNOSIS — I255 Ischemic cardiomyopathy: Secondary | ICD-10-CM

## 2018-03-08 DIAGNOSIS — I5022 Chronic systolic (congestive) heart failure: Secondary | ICD-10-CM

## 2018-03-08 DIAGNOSIS — I4901 Ventricular fibrillation: Secondary | ICD-10-CM

## 2018-03-10 NOTE — Telephone Encounter (Signed)
This is Dr. Hilty's pt 

## 2018-03-10 NOTE — Telephone Encounter (Signed)
Rx sent to pharmacy   

## 2018-03-19 ENCOUNTER — Other Ambulatory Visit: Payer: Self-pay | Admitting: Cardiology

## 2018-03-19 DIAGNOSIS — I255 Ischemic cardiomyopathy: Secondary | ICD-10-CM

## 2018-03-19 DIAGNOSIS — I4901 Ventricular fibrillation: Secondary | ICD-10-CM

## 2018-03-19 DIAGNOSIS — I5022 Chronic systolic (congestive) heart failure: Secondary | ICD-10-CM

## 2018-03-19 NOTE — Telephone Encounter (Signed)
This is Dr. Hilty's pt. Please address ?

## 2018-03-19 NOTE — Telephone Encounter (Signed)
Rx request sent to pharmacy.  

## 2018-03-26 ENCOUNTER — Encounter: Payer: Self-pay | Admitting: Cardiology

## 2018-03-26 ENCOUNTER — Ambulatory Visit (INDEPENDENT_AMBULATORY_CARE_PROVIDER_SITE_OTHER): Payer: Medicare Other | Admitting: *Deleted

## 2018-03-26 ENCOUNTER — Ambulatory Visit (INDEPENDENT_AMBULATORY_CARE_PROVIDER_SITE_OTHER): Payer: Medicare Other

## 2018-03-26 DIAGNOSIS — I4729 Other ventricular tachycardia: Secondary | ICD-10-CM

## 2018-03-26 DIAGNOSIS — I472 Ventricular tachycardia: Secondary | ICD-10-CM | POA: Diagnosis not present

## 2018-03-26 DIAGNOSIS — Z95810 Presence of automatic (implantable) cardiac defibrillator: Secondary | ICD-10-CM

## 2018-03-26 DIAGNOSIS — I5022 Chronic systolic (congestive) heart failure: Secondary | ICD-10-CM

## 2018-03-26 NOTE — Progress Notes (Signed)
EPIC Encounter for ICM Monitoring  Patient Name: [name] is a 62 y.o. male Date: 03/26/2018 Primary Care Physican: Adrian PrinceSouth, Stephen, MD Primary Cardiologist:Jordan Electrophysiologist: Allred Dry Weight:unknown      Spoke with wife due to patient was not home.  Heart Failure questions reviewed, pt has cough and chest congestion for past 2 weeks.  He eats out at restaurants some.     Thoracic impedance close to baseline normal.  No diuretic.  Recommendations: No changes.  Advised limited salt intake to 2000 mg and review food labels. Also limit fluid intake to 64 oz daily.    Follow-up plan: ICM clinic phone appointment on 04/02/2018 to recheck fluid levels. Office appointment scheduled 04/22/2018 with Gypsy BalsamAmber Seiler, NP.    Copy of ICM check sent to Dr. Johney FrameAllred.   3 month ICM trend: 03/26/2018    1 Year ICM trend:       Billy SodaLaurie S Worth Kober, RN 03/26/2018 3:14 PM

## 2018-03-26 NOTE — Progress Notes (Signed)
Remote ICD transmission.   

## 2018-04-02 ENCOUNTER — Ambulatory Visit (INDEPENDENT_AMBULATORY_CARE_PROVIDER_SITE_OTHER): Payer: Medicare Other

## 2018-04-02 DIAGNOSIS — Z95810 Presence of automatic (implantable) cardiac defibrillator: Secondary | ICD-10-CM

## 2018-04-02 DIAGNOSIS — I5022 Chronic systolic (congestive) heart failure: Secondary | ICD-10-CM

## 2018-04-02 NOTE — Progress Notes (Signed)
EPIC Encounter for ICM Monitoring  Patient Name: [name] is a 62 y.o. male Date: 04/02/2018 Primary Care Physican: Adrian PrinceSouth, Stephen, MD Primary Cardiologist:Jordan Electrophysiologist: Allred Dry Weight:unknown      Attempted call to patient and unable to reach.  Left detailed message, per DPR, regarding transmission.  Transmission reviewed.    Thoracic impedance abnormal suggesting fluid accumulation starting 03/01/2018 with a few days close to baseline.  Prescribed: Spironolactone 25 mg 1 tablet daily.  Recommendations: Left voice mail with ICM number and encouraged to call if experiencing any fluid symptoms.  Follow-up plan: ICM clinic phone appointment on 05/07/2018 since there is a defib office appointment scheduled 04/22/2018 with Billy BalsamAmber Seiler, NP.    Copy of ICM check sent to Dr. Johney FrameAllred and Dr SwazilandJordan.   3 month ICM trend: 04/02/2018    1 Year ICM trend:       Karie SodaLaurie S Short, RN 04/02/2018 3:35 PM

## 2018-04-06 ENCOUNTER — Other Ambulatory Visit: Payer: Self-pay | Admitting: Cardiology

## 2018-04-06 DIAGNOSIS — I5022 Chronic systolic (congestive) heart failure: Secondary | ICD-10-CM

## 2018-04-06 DIAGNOSIS — I4901 Ventricular fibrillation: Secondary | ICD-10-CM

## 2018-04-06 DIAGNOSIS — I255 Ischemic cardiomyopathy: Secondary | ICD-10-CM

## 2018-04-16 ENCOUNTER — Encounter: Payer: Medicare Other | Admitting: *Deleted

## 2018-04-16 VITALS — BP 115/69 | HR 79 | Wt 170.0 lb

## 2018-04-16 DIAGNOSIS — Z006 Encounter for examination for normal comparison and control in clinical research program: Secondary | ICD-10-CM

## 2018-04-16 NOTE — Progress Notes (Addendum)
[facility] Visit 9  Pt doing well. No complaints of cp or sob. Working full time, trying to eat healthy, and still no smoking. I told pt that I was proud of him for that.   All meds the same no changes.    Pt was in ER for cellulitis of Left lower leg on February 17 2018. He was given Bactrim 800-160 mg one tablet twice a day for 10 days, will add AE and update medication list. He is doing better.                                   "CONSENT"   YES     NO   Continuing further Investigational Product and study visits for follow-up? [x]  []   Continuing consent from future biomedical research [x]  []                                   "EVENTS"    YES     NO  AE   (IF YES SEE SOURCE) [x]  []   SAE  (IF YES SEE SOURCE) []  [x]   ENDPOINT   (IF YES SEE SOURCE) []  [x]   REVASCULARIZATION  (IF YES SEE SOURCE) []  [x]   AMPUTATION   (IF YES SEE SOURCE) []  [x]   TROPONIN'S  (IF YES SEE SOURCE) []  [x]    Lifestyle Adherence Assessment:    YES NO  Abstinence from smoking/remaining tobacco free X   Cardiac Diet X   Routine physical activity and/or cardiac rehabilitation X     Current Outpatient Medications:  .  aspirin EC 81 MG tablet, Take 1 tablet (81 mg total) by mouth daily., Disp: 90 tablet, Rfl: 3 .  atorvastatin (LIPITOR) 80 MG tablet, TAKE 1 TABLET BY MOUTH AT BEDTIME, Disp: 90 tablet, Rfl: 0 .  carvedilol (COREG) 25 MG tablet, TAKE 1 TABLET BY MOUTH TWICE DAILY WITH A MEAL, Disp: 180 tablet, Rfl: 2 .  nitroGLYCERIN (NITROSTAT) 0.4 MG SL tablet, Place 1 tablet (0.4 mg total) under the tongue every 5 (five) minutes x 3 doses as needed for chest pain. IF NEEDED, Disp: 25 tablet, Rfl: 6 .  ramipril (ALTACE) 10 MG capsule, Take 10 mg by mouth 2 (two) times daily., Disp: , Rfl:  .  spironolactone (ALDACTONE) 25 MG tablet, Take 1 tablet (25 mg total) by mouth daily., Disp: 90 tablet, Rfl: 3 .  ticagrelor (BRILINTA) 90 MG TABS tablet, Take 1 tablet (90 mg total) by mouth 2 (two) times daily., Disp: 180 tablet, Rfl:  3        95

## 2018-04-20 ENCOUNTER — Encounter: Payer: Self-pay | Admitting: Nurse Practitioner

## 2018-04-20 NOTE — Progress Notes (Signed)
Electrophysiology Office Note Date: 04/22/2018  ID:  Billy JacquetRobert E Ammon, DOB [birth year], MRN [id number]  PCP: Adrian PrinceSouth, Stephen, MD Primary Cardiologist: [place] Electrophysiologist: Allred  CC: Routine ICD follow-up  [name] is a 62 y.o. male seen today for Dr Johney FrameAllred.  He presents today for routine electrophysiology followup.  Since last being seen in our clinic, the patient reports doing very well. He continues to work as a Curatormechanic.  He ran out of Coreg in mid-August and was unable to get refilled despite several calls. He denies chest pain, palpitations, dyspnea, PND, orthopnea, nausea, vomiting, dizziness, syncope, edema, weight gain, or early satiety.  He has not had ICD shocks.   Device History: MDT dual chamber ICD implanted 2010 for VF arrest (extraction and re-implantation same year for MSSA bacteremia); gen change 2014 with new RV lead for RV lead fracture History of appropriate therapy: No History of AAD therapy: No   Past Medical History:  Diagnosis Date  . Adrenal mass (HCC)    currently being evaluated -- 4.1 cm right adrenal mass  . Anemia   . CAD (coronary artery disease)    2000-STEMI, DES to LAD, 1wk later VF arrest d/t 50% ISR, 2012- CABG X3, 10/16/17 STEMI with VT-, LHC patnet graft, DES to 1st OM & Circ  . Cardiac arrest - ventricular fibrillation 09/02/2008   a. s/p MDT dual chamber ICD  . History of methicillin resistant staphylococcus aureus (MRSA)    History of methicillin-sensitive Staphylococcus aureus bacteremia  with implantable cardioverter-defibrillator explant, February 23, 2009  . HL (hearing loss)   . Hyperlipidemia   . Hypertension   . Ischemic cardiomyopathy    severe ischemic cardiomyopathy -- ejection fraction 30-35%  . Pericarditis    history of acute pericarditis in July 2010 now resolved  . S/P CABG (coronary artery bypass graft) Sept 2012   Past Surgical History:  Procedure Laterality Date  . ANKLE SURGERY    . CARDIAC  CATHETERIZATION  08/26/2008   Est. EF of 45% -- Successful intracoronary stenting of the mid-LAD -- Three-vessel atherosclerotic coronary artery disease.  The patient has occlusion of the mid LAD, which is his culprit lesion.  He has  moderate disease in the proximal circumflex and PDA -- Moderate left ventricular dysfunction -- Peter M. Swazil[place]Jordan, [name]  . CORONARY ARTERY BYPASS GRAFT  05/07/2011   CABG x3:  LIMA to LAD, SVG to OM2, SVG to PDA  . CORONARY STENT INTERVENTION N/A 10/15/2017   Procedure: CORONARY STENT INTERVENTION;  Surgeon: [place]Jordan, Peter M, MD;  [facility];  Service: Cardiovascular;  Laterality: N/A;  . ICD implant  02/23/2009    with subsequent extraction for MSSA bacteremia  . ICD reimplantation  05/09/2009   status post implant of a Medtronic Maximo II DR dual- chamber cardioverter defibrillator by Dr. Hillis RangeJames Allred  . LEAD REVISION Left 03/05/2013   RV lead fracture.  A new [employer] [id number] lead was placed  . LEFT HEART CATH AND CORS/GRAFTS ANGIOGRAPHY N/A 10/15/2017   Procedure: LEFT HEART CATH AND CORS/GRAFTS ANGIOGRAPHY;  Surgeon: SwazilandJordan, Peter M, MD;  [facility];  Service: Cardiovascular;  Laterality: N/A;    Current Outpatient Medications  Medication Sig Dispense Refill  . aspirin EC 81 MG tablet Take 1 tablet (81 mg total) by mouth daily. 90 tablet 3  . atorvastatin (LIPITOR) 80 MG tablet TAKE 1 TABLET BY MOUTH AT BEDTIME 90 tablet 0  . carvedilol (COREG) 25 MG tablet  TAKE 1 TABLET BY MOUTH TWICE DAILY WITH A MEAL 180 tablet 2  . nitroGLYCERIN (NITROSTAT) 0.4 MG SL tablet Place 1 tablet (0.4 mg total) under the tongue every 5 (five) minutes x 3 doses as needed for chest pain. IF NEEDED 25 tablet 6  . ramipril (ALTACE) 10 MG capsule Take 10 mg by mouth 2 (two) times daily.    Marland Kitchen spironolactone (ALDACTONE) 25 MG tablet Take 1 tablet (25 mg total) by mouth daily. 90 tablet 3  . ticagrelor (BRILINTA) 90 MG TABS tablet Take 1 tablet (90 mg  total) by mouth 2 (two) times daily. 180 tablet 3   No current facility-administered medications for this visit.     Allergies:   Sacubitril-valsartan and Morphine and related   Social History: Social History   Socioeconomic History  . Marital status: Married    Spouse name: Not on file  . Number of children: 3  . Years of education: Not on file  . Highest education level: Not on file  Occupational History  . Occupation: Multimedia programmer  Social Needs  . Financial resource strain: Not on file  . Food insecurity:    Worry: Not on file    Inability: Not on file  . Transportation needs:    Medical: Not on file    Non-medical: Not on file  Tobacco Use  . Smoking status: Former Smoker    Years: 37.00    Types: Cigarettes    Last attempt to quit: 10/14/2017    Years since quitting: 0.5  . Smokeless tobacco: Never Used  . Tobacco comment: 1 pack per week. Has been smoking for 35 years.   Substance and Sexual Activity  . Alcohol use: No  . Drug use: No  . Sexual activity: Not on file  Lifestyle  . Physical activity:    Days per week: Not on file    Minutes per session: Not on file  . Stress: Not on file  Relationships  . Social connections:    Talks on phone: Not on file    Gets together: Not on file    Attends religious service: Not on file    Active member of club or organization: Not on file    Attends meetings of clubs or organizations: Not on file    Relationship status: Not on file  . Intimate partner violence:    Fear of current or ex partner: Not on file    Emotionally abused: Not on file    Physically abused: Not on file    Forced sexual activity: Not on file  Other Topics Concern  . Not on file  Social History Narrative   SOCIAL HISTORY:  He is married for 37 years with children.  He smokes 1 pack per day. He has been smoking for 37 years.  He denies alcohol use.  He works as a  Curator.         FAMILY HISTORY:  Father has a history of coronary  artery disease. Mother died in her 36s with a stroke.  He has 2 sisters who are alive and well.     Family History: Family History  Problem Relation Age of Onset  . Stroke Mother   . Coronary artery disease Father     Review of Systems: All other systems reviewed and are otherwise negative except as noted above.   Physical Exam: VS:  BP 112/72   Pulse 74   Ht 5\' 7"  (1.702 m)   Wt 170  lb 3.2 oz (77.2 kg)   SpO2 98%   BMI 26.66 kg/m  , BMI Body mass index is 26.66 kg/m.  GEN- The patient is well appearing, alert and oriented x 3 today.   HEENT: normocephalic, atraumatic; sclera clear, conjunctiva pink; hearing intact; oropharynx clear; neck supple  Lungs- Clear to ausculation bilaterally, normal work of breathing.  No wheezes, rales, rhonchi Heart- Regular rate and rhythm  GI- soft, non-tender, non-distended, bowel sounds present  Extremities- no clubbing, cyanosis, or edema  MS- no significant deformity or atrophy Skin- warm and dry, no rash or lesion; ICD pocket well healed Psych- euthymic mood, full affect Neuro- strength and sensation are intact  ICD interrogation- reviewed in detail today,  See PACEART report  EKG:  EKG is not ordered today.  Recent Labs: 10/15/2017: Magnesium 1.7 10/16/2017: ALT 29; Hemoglobin 14.7; Platelets 244 10/27/2017: BUN 15; Creatinine, Ser 1.03; Potassium 4.7; Sodium 136   Wt Readings from Last 3 Encounters:  04/22/18 170 lb 3.2 oz (77.2 kg)  04/16/18 170 lb (77.1 kg)  02/17/18 170 lb (77.1 kg)     Other studies Reviewed: Additional studies/ records that were reviewed today include: Dr Jenel Lucks office notes   Assessment and Plan:  1.  Chronic systolic dysfunction euvolemic today Stable on an appropriate medical regimen Normal ICD function See Pace Art report No changes today Continue follow up in ICM clinic   2.  CAD s/p CABG No recent ischemic symptoms Continue current therapy  3.  VT Prior VF arrest No recurrence since  last office visit   Current medicines are reviewed at length with the patient today.   The patient does not have concerns regarding his medicines.  The following changes were made today:  none  Labs/ tests ordered today include: none No orders of the defined types were placed in this encounter.    Disposition:   Follow up with [facility], ICM clinic, me in 1 year, [name] as scheduled    Signed, Gypsy Balsam, NP 04/22/2018 9:06 AM  [facility] [address] [place] [address] [phone] (office) [phone] (fax)

## 2018-04-22 ENCOUNTER — Ambulatory Visit (INDEPENDENT_AMBULATORY_CARE_PROVIDER_SITE_OTHER): Payer: Medicare Other | Admitting: Nurse Practitioner

## 2018-04-22 ENCOUNTER — Encounter: Payer: Self-pay | Admitting: Nurse Practitioner

## 2018-04-22 VITALS — BP 112/72 | HR 74 | Ht 67.0 in | Wt 170.2 lb

## 2018-04-22 DIAGNOSIS — I200 Unstable angina: Secondary | ICD-10-CM

## 2018-04-22 DIAGNOSIS — I472 Ventricular tachycardia: Secondary | ICD-10-CM | POA: Diagnosis not present

## 2018-04-22 DIAGNOSIS — I4901 Ventricular fibrillation: Secondary | ICD-10-CM | POA: Diagnosis not present

## 2018-04-22 DIAGNOSIS — I255 Ischemic cardiomyopathy: Secondary | ICD-10-CM | POA: Diagnosis not present

## 2018-04-22 DIAGNOSIS — I2510 Atherosclerotic heart disease of native coronary artery without angina pectoris: Secondary | ICD-10-CM

## 2018-04-22 DIAGNOSIS — I4729 Other ventricular tachycardia: Secondary | ICD-10-CM

## 2018-04-22 DIAGNOSIS — I5022 Chronic systolic (congestive) heart failure: Secondary | ICD-10-CM

## 2018-04-22 LAB — CUP PACEART INCLINIC DEVICE CHECK
Implantable Lead Implant Date: 20140711
Implantable Lead Location: 753859
Implantable Lead Location: 753860
Implantable Lead Model: 181
Implantable Lead Model: 5076
Implantable Lead Serial Number: 323897
Implantable Pulse Generator Implant Date: 20140711
MDC IDC LEAD IMPLANT DT: 20100414
MDC IDC SESS DTM: 20190828100530

## 2018-04-22 MED ORDER — CARVEDILOL 25 MG PO TABS: ORAL_TABLET | ORAL | 2 refills | Status: DC

## 2018-04-22 NOTE — Patient Instructions (Addendum)
Medication Instructions:   Your physician recommends that you continue on your current medications as directed. Please refer to the Current Medication list given to you today.   If you need a refill on your cardiac medications before your next appointment, please call your pharmacy.  Labwork: NONE ORDERED  TODAY    Testing/Procedures: NONE ORDERED  TODAY    Follow-Up: Your physician wants you to follow-up in: ONE YEAR WITH SEILER  You will receive a reminder letter in the mail two months in advance. If you don't receive a letter, please call our office to schedule the follow-up appointment.      Remote monitoring is used to monitor your Pacemaker of ICD from home. This monitoring reduces the number of office visits required to check your device to one time per year. It allows us to keep an eye on the functioning of your device to ensure it is working properly. You are scheduled for a device check from home on .06-25-18..You may send your transmission at any time that day. If you have a wireless device, the transmission will be sent automatically. After your physician reviews your transmission, you will receive a postcard with your next transmission date.     Any Other Special Instructions Will Be Listed Below (If Applicable).

## 2018-04-23 LAB — CUP PACEART REMOTE DEVICE CHECK
Battery Remaining Longevity: 47 mo
Brady Statistic AP VP Percent: 0 %
Brady Statistic AS VP Percent: 0 %
Brady Statistic RA Percent Paced: 97.49 %
HighPow Impedance: 68 Ohm
Implantable Lead Implant Date: 20140711
Implantable Lead Location: 753859
Implantable Lead Location: 753860
Implantable Lead Model: 181
Implantable Lead Serial Number: 323897
Implantable Pulse Generator Implant Date: 20140711
Lead Channel Impedance Value: 304 Ohm
Lead Channel Impedance Value: 456 Ohm
Lead Channel Pacing Threshold Amplitude: 1.875 V
Lead Channel Sensing Intrinsic Amplitude: 1.625 mV
Lead Channel Sensing Intrinsic Amplitude: 5.5 mV
Lead Channel Sensing Intrinsic Amplitude: 5.5 mV
Lead Channel Setting Pacing Amplitude: 2.75 V
Lead Channel Setting Pacing Pulse Width: 0.4 ms
MDC IDC LEAD IMPLANT DT: 20100414
MDC IDC MSMT BATTERY VOLTAGE: 2.97 V
MDC IDC MSMT LEADCHNL RA PACING THRESHOLD AMPLITUDE: 1.375 V
MDC IDC MSMT LEADCHNL RA PACING THRESHOLD PULSEWIDTH: 0.4 ms
MDC IDC MSMT LEADCHNL RA SENSING INTR AMPL: 1.625 mV
MDC IDC MSMT LEADCHNL RV IMPEDANCE VALUE: 304 Ohm
MDC IDC MSMT LEADCHNL RV PACING THRESHOLD PULSEWIDTH: 0.4 ms
MDC IDC SESS DTM: 20190801042207
MDC IDC SET LEADCHNL RV PACING AMPLITUDE: 2.5 V
MDC IDC SET LEADCHNL RV SENSING SENSITIVITY: 0.3 mV
MDC IDC STAT BRADY AP VS PERCENT: 97.54 %
MDC IDC STAT BRADY AS VS PERCENT: 2.45 %
MDC IDC STAT BRADY RV PERCENT PACED: 0 %

## 2018-04-24 ENCOUNTER — Telehealth: Payer: Self-pay | Admitting: Nurse Practitioner

## 2018-04-24 NOTE — Telephone Encounter (Signed)
New message:       [name] is calling [facility] and states that a message was sent over to Dr. [name] and they state that the pt has not been seen since 2013 so he no longer a pt of theirs.

## 2018-05-07 ENCOUNTER — Ambulatory Visit (INDEPENDENT_AMBULATORY_CARE_PROVIDER_SITE_OTHER): Payer: Medicare Other

## 2018-05-07 ENCOUNTER — Telehealth: Payer: Self-pay

## 2018-05-07 DIAGNOSIS — I5022 Chronic systolic (congestive) heart failure: Secondary | ICD-10-CM

## 2018-05-07 DIAGNOSIS — Z95810 Presence of automatic (implantable) cardiac defibrillator: Secondary | ICD-10-CM

## 2018-05-07 NOTE — Telephone Encounter (Signed)
Spoke with pt and reminded pt of remote transmission that is due today. Pt verbalized understanding.  Monthly check with Laurie. 

## 2018-05-08 ENCOUNTER — Telehealth: Payer: Self-pay

## 2018-05-08 NOTE — Progress Notes (Signed)
EPIC Encounter for ICM Monitoring  Patient Name: [name] is a 62 y.o. male Suezanne JacquetDate: 05/08/2018 Primary Care Physican: Adrian PrinceSouth, Stephen, MD Primary Cardiologist:Jordan Electrophysiologist: Allred Dry Weight:unknown      Attempted call to patient and unable to reach.  Left detailed message, per DPR, regarding transmission.  Transmission reviewed.    Thoracic impedance trending close to baseline normal.  Prescribed: Spironolactone 25 mg 1 tablet daily.  Recommendations: Left voice mail with ICM number and encouraged to call if experiencing any fluid symptoms.  Follow-up plan: ICM clinic phone appointment on 06/08/2018.      Copy of ICM check sent to Dr. Johney FrameAllred.   3 month ICM trend: 05/08/2018    1 Year ICM trend:       Karie SodaLaurie S Short, RN 05/08/2018 8:32 AM

## 2018-05-08 NOTE — Telephone Encounter (Signed)
Remote ICM transmission received.  Attempted call to patient and left detailed message, per DPR, regarding transmission and next ICM scheduled for 06/08/2018.  Advised to return call for any fluid symptoms or questions.    

## 2018-05-28 ENCOUNTER — Ambulatory Visit: Payer: Medicare Other | Admitting: Internal Medicine

## 2018-06-04 ENCOUNTER — Ambulatory Visit (INDEPENDENT_AMBULATORY_CARE_PROVIDER_SITE_OTHER): Payer: Medicare Other

## 2018-06-04 DIAGNOSIS — Z95810 Presence of automatic (implantable) cardiac defibrillator: Secondary | ICD-10-CM

## 2018-06-04 DIAGNOSIS — I5022 Chronic systolic (congestive) heart failure: Secondary | ICD-10-CM

## 2018-06-05 NOTE — Progress Notes (Signed)
EPIC Encounter for ICM Monitoring  Patient Name: [name] is a 62 y.o. male Date: 06/05/2018 Primary Care Physican: Adrian Prince, MD Primary Cardiologist:Jordan Electrophysiologist: Allred Dry Weight:unknown        Spoke with wife.  Heart Failure questions reviewed, pt asymptomatic.   Thoracic impedance normal.   Prescribed: Spironolactone 25 mg 1 tablet daily.  Recommendations: No changes.   Encouraged to call for fluid symptoms.  Follow-up plan: ICM clinic phone appointment on 07/06/2018.    Copy of ICM check sent to Dr. Johney Frame.   3 month ICM trend: 06/04/2018    1 Year ICM trend:       Karie Soda, RN 06/05/2018 12:10 PM

## 2018-06-11 ENCOUNTER — Other Ambulatory Visit: Payer: Self-pay | Admitting: Cardiology

## 2018-06-11 DIAGNOSIS — I4901 Ventricular fibrillation: Secondary | ICD-10-CM

## 2018-06-11 DIAGNOSIS — I5022 Chronic systolic (congestive) heart failure: Secondary | ICD-10-CM

## 2018-06-11 DIAGNOSIS — I255 Ischemic cardiomyopathy: Secondary | ICD-10-CM

## 2018-07-06 ENCOUNTER — Ambulatory Visit (INDEPENDENT_AMBULATORY_CARE_PROVIDER_SITE_OTHER): Payer: Medicare Other

## 2018-07-06 ENCOUNTER — Other Ambulatory Visit: Payer: Self-pay | Admitting: Cardiology

## 2018-07-06 ENCOUNTER — Other Ambulatory Visit: Payer: Self-pay | Admitting: Internal Medicine

## 2018-07-06 ENCOUNTER — Ambulatory Visit (INDEPENDENT_AMBULATORY_CARE_PROVIDER_SITE_OTHER): Payer: Medicare Other | Admitting: *Deleted

## 2018-07-06 DIAGNOSIS — I4729 Other ventricular tachycardia: Secondary | ICD-10-CM

## 2018-07-06 DIAGNOSIS — I5022 Chronic systolic (congestive) heart failure: Secondary | ICD-10-CM

## 2018-07-06 DIAGNOSIS — I4901 Ventricular fibrillation: Secondary | ICD-10-CM

## 2018-07-06 DIAGNOSIS — Z95810 Presence of automatic (implantable) cardiac defibrillator: Secondary | ICD-10-CM | POA: Diagnosis not present

## 2018-07-06 DIAGNOSIS — I472 Ventricular tachycardia: Secondary | ICD-10-CM | POA: Diagnosis not present

## 2018-07-06 DIAGNOSIS — I255 Ischemic cardiomyopathy: Secondary | ICD-10-CM

## 2018-07-06 NOTE — Progress Notes (Signed)
EPIC Encounter for ICM Monitoring  Patient Name: [name] is a 62 y.o. male Date: 07/06/2018 Primary Care Physican: Adrian Prince, MD Primary Cardiologist:Jordan Electrophysiologist: Allred Today's Weight:  unknown      Spoke with wife.  Heart Failure questions reviewed, pt asymptomatic.   She reported patient eats out frequently and does not adhere to low salt diet.  He drinks coffee and Pepsi's during the day but not sure the amount.    Thoracic impedance abnormal suggesting fluid accumulation starting 06/08/2018.   Prescribed: Spironolactone 25 mg 1 tablet daily.  Recommendations:  Advised to limit salt intake to 2000 mg daily and fluid intake to 64 oz daily.  Encouraged to read food labels at home for amount of salt per serving.  Encouraged to call for fluid symptoms.  Follow-up plan: ICM clinic phone appointment on 07/16/2018 to recheck fluid levels.    Copy of ICM check sent to Dr. Johney Frame.   3 month ICM trend: 07/06/2018    1 Year ICM trend:       Karie Soda, RN 07/06/2018 2:21 PM

## 2018-07-06 NOTE — Telephone Encounter (Signed)
Rx has been sent to the pharmacy electronically. ° °

## 2018-07-07 NOTE — Progress Notes (Signed)
Remote ICD transmission.   

## 2018-07-13 ENCOUNTER — Encounter: Payer: Self-pay | Admitting: *Deleted

## 2018-07-13 DIAGNOSIS — Z006 Encounter for examination for normal comparison and control in clinical research program: Secondary | ICD-10-CM

## 2018-07-13 NOTE — Research (Signed)
[facility] Visit 10 phone call   Pt doing well, no complaints of cp or sob. No med changes per pt.                                    "CONSENT"   YES     NO   Continuing further Investigational Product and study visits for follow-up? [x]  []   Continuing consent from future biomedical research [x]  []                                    "EVENTS"    YES     NO  AE   (IF YES SEE SOURCE) []  [x]   SAE  (IF YES SEE SOURCE) []  [x]   ENDPOINT   (IF YES SEE SOURCE) []  [x]   REVASCULARIZATION  (IF YES SEE SOURCE) []  [x]   AMPUTATION   (IF YES SEE SOURCE) []  [x]   TROPONIN'S  (IF YES SEE SOURCE) []  [x]     Current Outpatient Medications:  .  aspirin EC 81 MG tablet, Take 1 tablet (81 mg total) by mouth daily., Disp: 90 tablet, Rfl: 3 .  atorvastatin (LIPITOR) 80 MG tablet, TAKE 1 TABLET BY MOUTH AT BEDTIME, Disp: 90 tablet, Rfl: 1 .  carvedilol (COREG) 25 MG tablet, TAKE 1 TABLET BY MOUTH TWICE DAILY WITH A MEAL, Disp: 180 tablet, Rfl: 2 .  nitroGLYCERIN (NITROSTAT) 0.4 MG SL tablet, Place 1 tablet (0.4 mg total) under the tongue every 5 (five) minutes x 3 doses as needed for chest pain. IF NEEDED, Disp: 25 tablet, Rfl: 6 .  ramipril (ALTACE) 10 MG capsule, Take 10 mg by mouth 2 (two) times daily., Disp: , Rfl:  .  ramipril (ALTACE) 10 MG capsule, TAKE 1 CAPSULE BY MOUTH TWICE DAILY, Disp: 180 capsule, Rfl: 3 .  spironolactone (ALDACTONE) 25 MG tablet, TAKE 1 TABLET(25 MG) BY MOUTH DAILY, Disp: 90 tablet, Rfl: 3 .  ticagrelor (BRILINTA) 90 MG TABS tablet, Take 1 tablet (90 mg total) by mouth 2 (two) times daily., Disp: 180 tablet, Rfl: 3

## 2018-07-16 ENCOUNTER — Ambulatory Visit (INDEPENDENT_AMBULATORY_CARE_PROVIDER_SITE_OTHER): Payer: Medicare Other

## 2018-07-16 DIAGNOSIS — I5022 Chronic systolic (congestive) heart failure: Secondary | ICD-10-CM

## 2018-07-16 DIAGNOSIS — Z95810 Presence of automatic (implantable) cardiac defibrillator: Secondary | ICD-10-CM

## 2018-07-17 NOTE — Progress Notes (Signed)
EPIC Encounter for ICM Monitoring  Patient Name: Billy JacquetRobert E [name] is a 62 y.o. male Date: 07/17/2018 Primary Care Physican: Adrian PrinceSouth, Stephen, MD Primary Cardiologist:Jordan Electrophysiologist: Allred Today's Weight:  unknown                                                          Spoke with wife.  Heart Failure questions reviewed, pt asymptomatic. He is doing fine.    Thoracic impedance abnormal suggesting fluid accumulation starting 06/08/2018.   Prescribed: Spironolactone 25 mg 1 tablet daily.  Recommendations:  Advised to limit salt intake to 2000 mg daily and fluid intake to 64 oz daily.  Encouraged to read food labels at home for amount of salt per serving.  Encouraged to call for fluid symptoms.  Follow-up plan: ICM clinic phone appointment on 08/13/2018.    Copy of ICM check sent to Dr. Johney FrameAllred.   3 month ICM trend: 07/16/2018    1 Year ICM trend:       Karie SodaLaurie S Maclovia Uher, RN 07/17/2018 4:46 PM

## 2018-08-13 ENCOUNTER — Ambulatory Visit (INDEPENDENT_AMBULATORY_CARE_PROVIDER_SITE_OTHER): Payer: Medicare Other

## 2018-08-13 DIAGNOSIS — I5022 Chronic systolic (congestive) heart failure: Secondary | ICD-10-CM

## 2018-08-13 DIAGNOSIS — Z95810 Presence of automatic (implantable) cardiac defibrillator: Secondary | ICD-10-CM | POA: Diagnosis not present

## 2018-08-14 NOTE — Progress Notes (Signed)
EPIC Encounter for ICM Monitoring  Patient Name: [name] is a 62 y.o. male Date: 08/14/2018 Primary Care Physican: Adrian PrinceSouth, Stephen, MD Primary Cardiologist:Jordan Electrophysiologist: Allred Today's Weight: unknown  Transmission reviewed.   Thoracic impedance trending slightly below baseline normal.   Prescribed:Spironolactone 25 mg 1 tablet daily.  Recommendations:None  Follow-up plan: ICM clinic phone appointment on1/23/2020.   Copy of ICM check sent to Dr.Allred.   3 month ICM trend: 08/13/2018    1 Year ICM trend:       Billy SodaLaurie S Amadea Keagy, RN 08/14/2018 4:37 PM

## 2018-09-05 LAB — CUP PACEART REMOTE DEVICE CHECK
Battery Remaining Longevity: 44 mo
Brady Statistic AP VS Percent: 95.72 %
Brady Statistic AS VP Percent: 0 %
Brady Statistic AS VS Percent: 4.27 %
Brady Statistic RV Percent Paced: 0.01 %
HighPow Impedance: 65 Ohm
Implantable Lead Implant Date: 20100414
Implantable Lead Implant Date: 20140711
Implantable Lead Location: 753860
Implantable Lead Model: 5076
Implantable Lead Serial Number: 323897
Implantable Pulse Generator Implant Date: 20140711
Lead Channel Impedance Value: 323 Ohm
Lead Channel Impedance Value: 494 Ohm
Lead Channel Pacing Threshold Amplitude: 1.125 V
Lead Channel Pacing Threshold Amplitude: 1.875 V
Lead Channel Pacing Threshold Pulse Width: 0.4 ms
Lead Channel Pacing Threshold Pulse Width: 0.4 ms
Lead Channel Sensing Intrinsic Amplitude: 1.75 mV
Lead Channel Sensing Intrinsic Amplitude: 1.75 mV
Lead Channel Sensing Intrinsic Amplitude: 6.25 mV
Lead Channel Sensing Intrinsic Amplitude: 6.25 mV
Lead Channel Setting Pacing Amplitude: 2.5 V
Lead Channel Setting Pacing Pulse Width: 0.4 ms
MDC IDC LEAD LOCATION: 753859
MDC IDC MSMT BATTERY VOLTAGE: 2.97 V
MDC IDC MSMT LEADCHNL RV IMPEDANCE VALUE: 323 Ohm
MDC IDC SESS DTM: 20191111062407
MDC IDC SET LEADCHNL RA PACING AMPLITUDE: 2.5 V
MDC IDC SET LEADCHNL RV SENSING SENSITIVITY: 0.3 mV
MDC IDC STAT BRADY AP VP PERCENT: 0.01 %
MDC IDC STAT BRADY RA PERCENT PACED: 95.65 %

## 2018-09-17 ENCOUNTER — Ambulatory Visit (INDEPENDENT_AMBULATORY_CARE_PROVIDER_SITE_OTHER): Payer: Medicare Other

## 2018-09-17 DIAGNOSIS — I5022 Chronic systolic (congestive) heart failure: Secondary | ICD-10-CM

## 2018-09-17 DIAGNOSIS — Z95810 Presence of automatic (implantable) cardiac defibrillator: Secondary | ICD-10-CM | POA: Diagnosis not present

## 2018-09-21 ENCOUNTER — Telehealth: Payer: Self-pay

## 2018-09-21 NOTE — Progress Notes (Signed)
EPIC Encounter for ICM Monitoring  Patient Name: ZAKHARI MICHNIEWICZ is a 63 y.o. male Date: 09/21/2018 Primary Care Physican: Adrian Prince, MD Primary Cardiologist:Jordan Electrophysiologist: Allred Today's Weight: unknown  Attempted call to patient.  Left detailed message per DPR regarding transmission. Transmission reviewed.   Thoracic impedance normal.   Prescribed:Spironolactone 25 mg 1 tablet daily.  Recommendations:Left voice mail with ICM number and encouraged to call if experiencing any fluid symptoms.  Follow-up plan: ICM clinic phone appointment on2/25/2020.   Copy of ICM check sent to Dr.Allred.  3 month ICM trend: 09/17/2018    1 Year ICM trend:       Karie Soda, RN 09/21/2018 1:38 PM

## 2018-09-21 NOTE — Telephone Encounter (Signed)
Remote ICM transmission received.  Attempted call to patient regarding ICM remote transmission and left detailed message, per DPR, with next ICM remote transmission date of 10/20/2018.  Advised to return call for any fluid symptoms or questions.    

## 2018-10-10 ENCOUNTER — Other Ambulatory Visit: Payer: Self-pay | Admitting: Adult Health

## 2018-10-10 DIAGNOSIS — I4901 Ventricular fibrillation: Secondary | ICD-10-CM

## 2018-10-10 DIAGNOSIS — I5022 Chronic systolic (congestive) heart failure: Secondary | ICD-10-CM

## 2018-10-10 DIAGNOSIS — I255 Ischemic cardiomyopathy: Secondary | ICD-10-CM

## 2018-10-20 ENCOUNTER — Ambulatory Visit (INDEPENDENT_AMBULATORY_CARE_PROVIDER_SITE_OTHER): Payer: Medicare Other

## 2018-10-20 DIAGNOSIS — I5022 Chronic systolic (congestive) heart failure: Secondary | ICD-10-CM | POA: Diagnosis not present

## 2018-10-20 DIAGNOSIS — Z95810 Presence of automatic (implantable) cardiac defibrillator: Secondary | ICD-10-CM | POA: Diagnosis not present

## 2018-10-21 ENCOUNTER — Telehealth: Payer: Self-pay

## 2018-10-21 VITALS — BP 106/61 | HR 66 | Wt 169.4 lb

## 2018-10-21 DIAGNOSIS — Z006 Encounter for examination for normal comparison and control in clinical research program: Secondary | ICD-10-CM

## 2018-10-21 NOTE — Progress Notes (Signed)
EPIC Encounter for ICM Monitoring  Patient Name: [name] is a 63 y.o. male Date: 10/21/2018 Primary Care Physican: Adrian Prince, MD Primary Cardiologist:Jordan Electrophysiologist: Allred Today's Weight: unknown  Attempted call to patient.  Left detailed message per DPR regarding transmission. Transmission reviewed.   Thoracic impedance abnormal suggesting fluid since 10/14/2018 but back at baseline normal 10/20/2018.     Prescribed:Spironolactone 25 mg 1 tablet daily.  Recommendations:Left voice mail with ICM number and encouraged to call if experiencing any fluid symptoms.  Follow-up plan: ICM clinic phone appointment on3/30/2020.   Copy of ICM check sent to Dr.Allred.  3 month ICM trend: 10/20/2018    1 Year ICM trend:       Karie Soda, RN 10/21/2018 10:59 AM

## 2018-10-21 NOTE — Telephone Encounter (Signed)
Remote ICM transmission received.  Attempted call to patient regarding ICM remote transmission and no message left. 

## 2018-10-21 NOTE — Research (Addendum)
[facility] Research Study Visit 11  Lifestyle Adherence Assessment:    YES NO  Abstinence from smoking/remaining tobacco free  X  Cardiac Diet X   Routine physical activity and/or cardiac rehabilitation X                                       "CONSENT"   YES     NO   Continuing further Investigational Product and study visits for follow-up? [x]  []   Continuing consent from future biomedical research [x]  []                                      "EVENTS"    YES     NO  AE   (IF YES SEE SOURCE) []  [x]   SAE  (IF YES SEE SOURCE) []  [x]   ENDPOINT   (IF YES SEE SOURCE) []  [x]   REVASCULARIZATION  (IF YES SEE SOURCE) []  [x]   AMPUTATION   (IF YES SEE SOURCE) []  [x]   TROPONIN'S  (IF YES SEE SOURCE) []  [x]    [id number]  MOBILITY:    I HAVE NO PROBLEMS WALKING [x]   I HAVE SLIGHT PROBLEMS WALKING []   I HAVE MODERATE PROBLEMS WALKING []   I HAVE SEVERE PROBLEMS WALKING []   I AM UNABLE TO WALK  []     SELF-CARE:   I HAVE NO PROBLEMS WASHING OR DRESSING MYSELF  [x]   I HAVE SLIGHT PROBLEMS WASHING OR DRESSING MYSELF  []   I HAVE MODERATE PROBLEMS WASHING OR DRESSING MYSELF []   I HAVE SEVERE PROBLEMS WASHING OR DRESSING MYSELF  []   I HAVE SEVERE PROBLEMS WASHING OR DRESSING MYSELF  []   I AM UNABLE TO WASH OR DRESS MYSELF []     USUAL ACTIVITIES: (E.G. WORK/STUDY/HOUSEWORK/FAMILY OR LEISURE ACTIVITIES.    I HAVE NO PROBLEMS DOING MY USUAL ACTIVITIES [x]   I HAVE SLIGHT PROBLEMS DOING MY USUAL ACTIVITIES []   I HAVE MODERATE PROBLEMS DOING MY USUAL ACTIVIITIES []   I HAVE SEVERE PROBLEMS DOING MY USUAL ACTIVITIES []   I AM UNABLE TO DO MY USUAL ACTIVITIES []     PAIN /DISCOMFORT   I HAVE NO PAIN OR DISCOMFORT [x]   I HAVE SLIGHT PAIN OR DISCOMFORT []   I HAVE MODERATE PAIN OR DISCOMFORT []   I HAVE SEVERE PAIN OR DISCOMFORT []   I HAVE EXTREME PAIN OR DISCOMFORT []     ANXIETY/DEPRESSION   I AM NOT ANXIOUS OR DEPRESSED [x]   I AM SLIGHTLY ANXIOUS OR DEPRESSED []   I AM MODERATELY ANXIOUS OR DREPRESSED []    I AM SEVERELY ANXIOUS OR DEPRESSED []   I AM EXTREMELY ANXIOUS OR DEPRESSED []     SCALE OF 0-100 HOW WOULD YOU RATE TODAY?  0 IS THE WORSE AND 100 IS THE BEST HEALTH YOU CAN IMAGINE: 90    As the principal investigator of the AEGIS trial at [facility], I have reviewed the patients history, indications for the trial, labwork and other factors and find that they meet criteria for enrollment in the study. The patient was provided, reviewed and comprehended written consent to participate in the study and is agreeable to proceed.  Chrystie Nose, MD, [facility], FACP  [place]  [facility]  Medical Director of the Advanced Lipid Disorders &  Cardiovascular Risk Reduction Clinic Diplomate of the American Board of Clinical Lipidology Attending Cardiologist  Direct Dial: [phone]  Fax: [phone]  Website:  [email or web address]

## 2018-11-23 ENCOUNTER — Ambulatory Visit (INDEPENDENT_AMBULATORY_CARE_PROVIDER_SITE_OTHER): Payer: Medicare Other

## 2018-11-23 ENCOUNTER — Other Ambulatory Visit: Payer: Self-pay

## 2018-11-23 DIAGNOSIS — I5022 Chronic systolic (congestive) heart failure: Secondary | ICD-10-CM | POA: Diagnosis not present

## 2018-11-23 DIAGNOSIS — Z95810 Presence of automatic (implantable) cardiac defibrillator: Secondary | ICD-10-CM

## 2018-11-24 NOTE — Progress Notes (Signed)
EPIC Encounter for ICM Monitoring  Patient Name: [name] is a 63 y.o. male Date: 11/24/2018 Primary Care Physican: Adrian Prince, MD Primary Cardiologist:Jordan Electrophysiologist: Allred Today's Weight: unknown  Transmission reviewed.  Thoracic impedance normal.     Prescribed:Spironolactone 25 mg 1 tablet daily.  Recommendations:None  Follow-up plan: ICM clinic phone appointment on5/11/2018.   Copy of ICM check sent to Dr.Allred.  3 month ICM trend: 11/23/2018    1 Year ICM trend:       Karie Soda, RN 11/24/2018 2:01 PM

## 2018-11-25 ENCOUNTER — Ambulatory Visit (INDEPENDENT_AMBULATORY_CARE_PROVIDER_SITE_OTHER): Payer: Medicare Other | Admitting: *Deleted

## 2018-11-25 ENCOUNTER — Other Ambulatory Visit: Payer: Self-pay

## 2018-11-25 DIAGNOSIS — I472 Ventricular tachycardia: Secondary | ICD-10-CM | POA: Diagnosis not present

## 2018-11-25 DIAGNOSIS — I4729 Other ventricular tachycardia: Secondary | ICD-10-CM

## 2018-11-25 LAB — CUP PACEART REMOTE DEVICE CHECK
Battery Remaining Longevity: 36 mo
Battery Voltage: 2.95 V
Brady Statistic AP VP Percent: 0 %
Brady Statistic AP VS Percent: 97.61 %
Brady Statistic AS VP Percent: 0 %
Brady Statistic AS VS Percent: 2.39 %
Brady Statistic RA Percent Paced: 97.58 %
Brady Statistic RV Percent Paced: 0 %
Date Time Interrogation Session: 20200401041710
HighPow Impedance: 68 Ohm
Implantable Lead Implant Date: 20100414
Implantable Lead Implant Date: 20140711
Implantable Lead Location: 753859
Implantable Lead Location: 753860
Implantable Lead Model: 181
Implantable Lead Model: 5076
Implantable Lead Serial Number: 323897
Implantable Pulse Generator Implant Date: 20140711
Lead Channel Impedance Value: 304 Ohm
Lead Channel Impedance Value: 323 Ohm
Lead Channel Impedance Value: 494 Ohm
Lead Channel Pacing Threshold Amplitude: 1.25 V
Lead Channel Pacing Threshold Amplitude: 1.875 V
Lead Channel Pacing Threshold Pulse Width: 0.4 ms
Lead Channel Pacing Threshold Pulse Width: 0.4 ms
Lead Channel Sensing Intrinsic Amplitude: 1.375 mV
Lead Channel Sensing Intrinsic Amplitude: 1.375 mV
Lead Channel Sensing Intrinsic Amplitude: 6.125 mV
Lead Channel Sensing Intrinsic Amplitude: 6.125 mV
Lead Channel Setting Pacing Amplitude: 2.5 V
Lead Channel Setting Pacing Amplitude: 2.5 V
Lead Channel Setting Pacing Pulse Width: 0.4 ms
Lead Channel Setting Sensing Sensitivity: 0.3 mV

## 2018-11-30 ENCOUNTER — Encounter: Payer: Self-pay | Admitting: Cardiology

## 2018-11-30 NOTE — Progress Notes (Signed)
Remote ICD transmission.   

## 2018-12-28 ENCOUNTER — Ambulatory Visit (INDEPENDENT_AMBULATORY_CARE_PROVIDER_SITE_OTHER): Payer: Medicare Other

## 2018-12-28 ENCOUNTER — Other Ambulatory Visit: Payer: Self-pay

## 2018-12-28 DIAGNOSIS — Z95810 Presence of automatic (implantable) cardiac defibrillator: Secondary | ICD-10-CM

## 2018-12-28 DIAGNOSIS — I5022 Chronic systolic (congestive) heart failure: Secondary | ICD-10-CM | POA: Diagnosis not present

## 2018-12-30 NOTE — Progress Notes (Signed)
EPIC Encounter for ICM Monitoring  Patient Name: [name] is a 63 y.o. male Date: 12/30/2018 Primary Care Physican: Adrian Prince, MD  Primary Cardiologist:Jordan Electrophysiologist: Allred Today's Weight: unknown  Transmission reviewed and results sent to patient via mychart.  Thoracic impedancenormal.  Prescribed:Spironolactone 25 mg 1 tablet daily.  Recommendations:None  Follow-up plan: ICM clinic phone appointment on6/03/2019.   Copy of ICM check sent to Dr.Allred.   3 month ICM trend: 12/28/2018    1 Year ICM trend:       Karie Soda, RN 12/30/2018 1:02 PM

## 2019-02-01 ENCOUNTER — Ambulatory Visit (INDEPENDENT_AMBULATORY_CARE_PROVIDER_SITE_OTHER): Payer: Medicare Other

## 2019-02-01 DIAGNOSIS — I5022 Chronic systolic (congestive) heart failure: Secondary | ICD-10-CM | POA: Diagnosis not present

## 2019-02-01 DIAGNOSIS — Z95810 Presence of automatic (implantable) cardiac defibrillator: Secondary | ICD-10-CM | POA: Diagnosis not present

## 2019-02-05 NOTE — Progress Notes (Signed)
EPIC Encounter for ICM Monitoring  Patient Name: [name] is a 63 y.o. male Date: 02/05/2019 Primary Care Physican: Reynold Bowen, MD Primary [place] Electrophysiologist: Allred Today's Weight: unknown  Spoke with wife.  She stated patient is feeling fine but is eating a lot of macaroni and cheese.  Advised cheese and noodles are high in salt and be sure that other foods during the day are low in salt if he eats the cheese.  Optivol thoracic impedancenormal but was abnormal 5/26 until 02/01/2019.  Prescribed:Spironolactone 25 mg 1 tablet daily.  Recommendations: Limit salt and fluid intake.  Reviewed fluid symptoms and advised to call if patient experiences any sx.  Follow-up plan: ICM clinic phone appointment on7/13/2020.   Copy of ICM check sent to Dr.Allred.   3 month ICM trend: 02/01/2019    1 Year ICM trend:       Rosalene Billings, RN 02/05/2019 7:51 AM

## 2019-02-24 ENCOUNTER — Ambulatory Visit (INDEPENDENT_AMBULATORY_CARE_PROVIDER_SITE_OTHER): Payer: Medicare Other | Admitting: *Deleted

## 2019-02-24 DIAGNOSIS — I472 Ventricular tachycardia: Secondary | ICD-10-CM | POA: Diagnosis not present

## 2019-02-24 DIAGNOSIS — I4729 Other ventricular tachycardia: Secondary | ICD-10-CM

## 2019-02-24 LAB — CUP PACEART REMOTE DEVICE CHECK
Battery Remaining Longevity: 32 mo
Battery Voltage: 2.95 V
Brady Statistic AP VP Percent: 0.01 %
Brady Statistic AP VS Percent: 97.35 %
Brady Statistic AS VP Percent: 0 %
Brady Statistic AS VS Percent: 2.64 %
Brady Statistic RA Percent Paced: 97.29 %
Brady Statistic RV Percent Paced: 0.01 %
Date Time Interrogation Session: 20200701083827
HighPow Impedance: 64 Ohm
Implantable Lead Implant Date: 20100414
Implantable Lead Implant Date: 20140711
Implantable Lead Location: 753859
Implantable Lead Location: 753860
Implantable Lead Model: 181
Implantable Lead Model: 5076
Implantable Lead Serial Number: 323897
Implantable Pulse Generator Implant Date: 20140711
Lead Channel Impedance Value: 304 Ohm
Lead Channel Impedance Value: 304 Ohm
Lead Channel Impedance Value: 456 Ohm
Lead Channel Pacing Threshold Amplitude: 1.375 V
Lead Channel Pacing Threshold Amplitude: 1.875 V
Lead Channel Pacing Threshold Pulse Width: 0.4 ms
Lead Channel Pacing Threshold Pulse Width: 0.4 ms
Lead Channel Sensing Intrinsic Amplitude: 1.625 mV
Lead Channel Sensing Intrinsic Amplitude: 1.625 mV
Lead Channel Sensing Intrinsic Amplitude: 5 mV
Lead Channel Sensing Intrinsic Amplitude: 5 mV
Lead Channel Setting Pacing Amplitude: 2.5 V
Lead Channel Setting Pacing Amplitude: 2.75 V
Lead Channel Setting Pacing Pulse Width: 0.4 ms
Lead Channel Setting Sensing Sensitivity: 0.3 mV

## 2019-03-05 ENCOUNTER — Encounter: Payer: Self-pay | Admitting: Cardiology

## 2019-03-05 NOTE — Progress Notes (Signed)
Remote ICD transmission.   

## 2019-03-08 ENCOUNTER — Ambulatory Visit (INDEPENDENT_AMBULATORY_CARE_PROVIDER_SITE_OTHER): Payer: Medicare Other

## 2019-03-08 DIAGNOSIS — I5022 Chronic systolic (congestive) heart failure: Secondary | ICD-10-CM | POA: Diagnosis not present

## 2019-03-08 DIAGNOSIS — Z95810 Presence of automatic (implantable) cardiac defibrillator: Secondary | ICD-10-CM

## 2019-03-12 NOTE — Progress Notes (Signed)
EPIC Encounter for ICM Monitoring  Patient Name: [name] is a 63 y.o. male Date: 03/12/2019 Primary Care Physican: Reynold Bowen, MD Primary [place] Electrophysiologist: Allred Weight: 160-165 lbs  Spoke with wife.  She said patient is doing well and has no fluid symptoms.  Optivol thoracic impedancenormal but was abnormal 02/16/2019 - 03/04/2019.  Prescribed:Spironolactone 25 mg 1 tablet daily.  Recommendations: Limit salt and fluid intake.  Reviewed fluid symptoms and advised to call if patient experiences any sx.  Follow-up plan: ICM clinic phone appointment on8/17/2020.   Copy of ICM check sent to Dr.Allred.   3 month ICM trend: 03/08/2019    1 Year ICM trend:       Rosalene Billings, RN 03/12/2019 9:25 AM

## 2019-04-12 ENCOUNTER — Ambulatory Visit (INDEPENDENT_AMBULATORY_CARE_PROVIDER_SITE_OTHER): Payer: Medicare Other

## 2019-04-12 DIAGNOSIS — Z95810 Presence of automatic (implantable) cardiac defibrillator: Secondary | ICD-10-CM | POA: Diagnosis not present

## 2019-04-12 DIAGNOSIS — I5022 Chronic systolic (congestive) heart failure: Secondary | ICD-10-CM | POA: Diagnosis not present

## 2019-04-16 NOTE — Progress Notes (Signed)
EPIC Encounter for ICM Monitoring  Patient Name: [name] is a 63 y.o. male Date: 04/16/2019 Primary Care Physican: Reynold Bowen, MD Primary [place] Electrophysiologist: Allred Weight: 160-165 lbs  Spoke with wife. She said patient is doing well and has no fluid symptoms.  She said she has stopped him from eating chips and bean dip.    Optivol thoracic impedancenormal.  Prescribed:Spironolactone 25 mg 1 tablet daily.  Recommendations: Limit salt and fluid intake. Reviewed fluid symptoms and advised to call if patient experiences any sx.  Follow-up plan: ICM clinic phone appointment on10/07/2019.   Copy of ICM check sent to Dr.Allred.   3 month ICM trend: 04/12/2019    1 Year ICM trend:       Rosalene Billings, RN 04/16/2019 11:04 AM

## 2019-05-26 ENCOUNTER — Ambulatory Visit (INDEPENDENT_AMBULATORY_CARE_PROVIDER_SITE_OTHER): Payer: Medicare Other | Admitting: *Deleted

## 2019-05-26 DIAGNOSIS — I4729 Other ventricular tachycardia: Secondary | ICD-10-CM

## 2019-05-26 DIAGNOSIS — I472 Ventricular tachycardia: Secondary | ICD-10-CM

## 2019-05-26 LAB — CUP PACEART REMOTE DEVICE CHECK
Battery Remaining Longevity: 30 mo
Battery Voltage: 2.95 V
Brady Statistic AP VP Percent: 0.01 %
Brady Statistic AP VS Percent: 98.07 %
Brady Statistic AS VP Percent: 0 %
Brady Statistic AS VS Percent: 1.92 %
Brady Statistic RA Percent Paced: 98.03 %
Brady Statistic RV Percent Paced: 0.01 %
Date Time Interrogation Session: 20200930073426
HighPow Impedance: 64 Ohm
Implantable Lead Implant Date: 20100414
Implantable Lead Implant Date: 20140711
Implantable Lead Location: 753859
Implantable Lead Location: 753860
Implantable Lead Model: 181
Implantable Lead Model: 5076
Implantable Lead Serial Number: 323897
Implantable Pulse Generator Implant Date: 20140711
Lead Channel Impedance Value: 304 Ohm
Lead Channel Impedance Value: 323 Ohm
Lead Channel Impedance Value: 494 Ohm
Lead Channel Pacing Threshold Amplitude: 1.375 V
Lead Channel Pacing Threshold Amplitude: 1.875 V
Lead Channel Pacing Threshold Pulse Width: 0.4 ms
Lead Channel Pacing Threshold Pulse Width: 0.4 ms
Lead Channel Sensing Intrinsic Amplitude: 1.5 mV
Lead Channel Sensing Intrinsic Amplitude: 1.5 mV
Lead Channel Sensing Intrinsic Amplitude: 5.5 mV
Lead Channel Sensing Intrinsic Amplitude: 5.5 mV
Lead Channel Setting Pacing Amplitude: 2.5 V
Lead Channel Setting Pacing Amplitude: 3 V
Lead Channel Setting Pacing Pulse Width: 0.4 ms
Lead Channel Setting Sensing Sensitivity: 0.3 mV

## 2019-05-28 ENCOUNTER — Other Ambulatory Visit: Payer: Self-pay | Admitting: Adult Health

## 2019-05-28 DIAGNOSIS — I255 Ischemic cardiomyopathy: Secondary | ICD-10-CM

## 2019-05-28 DIAGNOSIS — I5022 Chronic systolic (congestive) heart failure: Secondary | ICD-10-CM

## 2019-05-28 DIAGNOSIS — I4901 Ventricular fibrillation: Secondary | ICD-10-CM

## 2019-05-31 NOTE — Telephone Encounter (Signed)
Okay to refill but will need appointment for further refills. He has not been seen since March of 2019.

## 2019-06-02 ENCOUNTER — Encounter: Payer: Self-pay | Admitting: Cardiology

## 2019-06-02 NOTE — Progress Notes (Signed)
Remote ICD transmission.   

## 2019-06-07 ENCOUNTER — Ambulatory Visit (INDEPENDENT_AMBULATORY_CARE_PROVIDER_SITE_OTHER): Payer: Medicare Other

## 2019-06-07 DIAGNOSIS — Z95810 Presence of automatic (implantable) cardiac defibrillator: Secondary | ICD-10-CM | POA: Diagnosis not present

## 2019-06-07 DIAGNOSIS — I5022 Chronic systolic (congestive) heart failure: Secondary | ICD-10-CM | POA: Diagnosis not present

## 2019-06-09 NOTE — Progress Notes (Signed)
EPIC Encounter for ICM Monitoring  Patient Name: [name] is a 63 y.o. male Date: 06/09/2019 Primary Care Physican: Reynold Bowen, MD Primary [place] Electrophysiologist: Allred Weight:160-165 lbs  Transmission reviewed.  Optivol thoracic impedancenormal.  Prescribed:Spironolactone 25 mg 1 tablet daily.  Recommendations: None.  Follow-up plan: ICM clinic phone appointment on 07/12/2019.   91 day device clinic remote transmission 08/18/2019.    Copy of ICM check sent to Dr. Rayann Heman.   3 month ICM trend: 06/07/2019    1 Year ICM trend:       Rosalene Billings, RN 06/09/2019 1:51 PM

## 2019-07-06 DIAGNOSIS — R9431 Abnormal electrocardiogram [ECG] [EKG]: Secondary | ICD-10-CM | POA: Diagnosis not present

## 2019-07-06 DIAGNOSIS — D510 Vitamin B12 deficiency anemia due to intrinsic factor deficiency: Secondary | ICD-10-CM | POA: Diagnosis not present

## 2019-07-06 DIAGNOSIS — E7849 Other hyperlipidemia: Secondary | ICD-10-CM | POA: Diagnosis not present

## 2019-07-06 DIAGNOSIS — I110 Hypertensive heart disease with heart failure: Secondary | ICD-10-CM | POA: Diagnosis not present

## 2019-07-06 DIAGNOSIS — E559 Vitamin D deficiency, unspecified: Secondary | ICD-10-CM | POA: Diagnosis not present

## 2019-07-06 DIAGNOSIS — R079 Chest pain, unspecified: Secondary | ICD-10-CM | POA: Diagnosis not present

## 2019-07-06 DIAGNOSIS — E612 Magnesium deficiency: Secondary | ICD-10-CM | POA: Diagnosis not present

## 2019-07-06 DIAGNOSIS — R972 Elevated prostate specific antigen [PSA]: Secondary | ICD-10-CM | POA: Diagnosis not present

## 2019-07-06 DIAGNOSIS — R601 Generalized edema: Secondary | ICD-10-CM | POA: Diagnosis not present

## 2019-07-06 DIAGNOSIS — R5383 Other fatigue: Secondary | ICD-10-CM | POA: Diagnosis not present

## 2019-07-12 ENCOUNTER — Ambulatory Visit (INDEPENDENT_AMBULATORY_CARE_PROVIDER_SITE_OTHER): Payer: Medicare Other

## 2019-07-12 DIAGNOSIS — Z95810 Presence of automatic (implantable) cardiac defibrillator: Secondary | ICD-10-CM

## 2019-07-12 DIAGNOSIS — I5022 Chronic systolic (congestive) heart failure: Secondary | ICD-10-CM

## 2019-07-13 DIAGNOSIS — E7849 Other hyperlipidemia: Secondary | ICD-10-CM | POA: Diagnosis not present

## 2019-07-13 DIAGNOSIS — E559 Vitamin D deficiency, unspecified: Secondary | ICD-10-CM | POA: Diagnosis not present

## 2019-07-13 DIAGNOSIS — I110 Hypertensive heart disease with heart failure: Secondary | ICD-10-CM | POA: Diagnosis not present

## 2019-07-14 NOTE — Progress Notes (Signed)
EPIC Encounter for ICM Monitoring  Patient Name: FRIEND DORFMAN is a 63 y.o. male Date: 07/14/2019 Primary Care Physican: Reynold Bowen, MD Primary [place] Electrophysiologist: Allred Weight:160-165 lbs  Transmission reviewed.  Optivol thoracic impedancenormal.  Prescribed:Spironolactone 25 mg 1 tablet daily.  Recommendations: None.  Follow-up plan: ICM clinic phone appointment on 08/12/2019.   91 day device clinic remote transmission 08/25/2019.    Copy of ICM check sent to Dr. Rayann Heman.   3 month ICM trend: 07/12/2019    1 Year ICM trend:       Rosalene Billings, RN 07/14/2019 4:27 PM

## 2019-08-02 ENCOUNTER — Other Ambulatory Visit: Payer: Self-pay

## 2019-08-02 DIAGNOSIS — I5022 Chronic systolic (congestive) heart failure: Secondary | ICD-10-CM

## 2019-08-02 DIAGNOSIS — I4901 Ventricular fibrillation: Secondary | ICD-10-CM

## 2019-08-02 DIAGNOSIS — I255 Ischemic cardiomyopathy: Secondary | ICD-10-CM

## 2019-08-02 MED ORDER — ATORVASTATIN CALCIUM 80 MG PO TABS: 80.0000 mg | ORAL_TABLET | Freq: Every day | ORAL | 0 refills | Status: DC

## 2019-08-16 ENCOUNTER — Telehealth: Payer: Self-pay

## 2019-08-16 MED ORDER — RAMIPRIL 10 MG PO CAPS: 10.0000 mg | ORAL_CAPSULE | Freq: Two times a day (BID) | ORAL | 0 refills | Status: DC

## 2019-08-18 NOTE — Progress Notes (Signed)
No ICM remote transmission received for 08/12/2019 and next ICM transmission scheduled for 09/20/2019.   

## 2019-08-18 NOTE — Telephone Encounter (Signed)
F/u scheduled 

## 2019-08-24 ENCOUNTER — Ambulatory Visit (INDEPENDENT_AMBULATORY_CARE_PROVIDER_SITE_OTHER): Payer: Medicare Other | Admitting: Physician Assistant

## 2019-08-24 ENCOUNTER — Other Ambulatory Visit: Payer: Self-pay

## 2019-08-24 ENCOUNTER — Encounter: Payer: Self-pay | Admitting: Physician Assistant

## 2019-08-24 VITALS — BP 115/77 | HR 84 | Ht 67.0 in | Wt 166.8 lb

## 2019-08-24 DIAGNOSIS — I25810 Atherosclerosis of coronary artery bypass graft(s) without angina pectoris: Secondary | ICD-10-CM | POA: Diagnosis not present

## 2019-08-24 DIAGNOSIS — I10 Essential (primary) hypertension: Secondary | ICD-10-CM

## 2019-08-24 DIAGNOSIS — I255 Ischemic cardiomyopathy: Secondary | ICD-10-CM | POA: Diagnosis not present

## 2019-08-24 DIAGNOSIS — Z95810 Presence of automatic (implantable) cardiac defibrillator: Secondary | ICD-10-CM | POA: Diagnosis not present

## 2019-08-24 DIAGNOSIS — E7800 Pure hypercholesterolemia, unspecified: Secondary | ICD-10-CM

## 2019-08-24 MED ORDER — RAMIPRIL 10 MG PO CAPS: 10.0000 mg | ORAL_CAPSULE | Freq: Two times a day (BID) | ORAL | 2 refills | Status: DC

## 2019-08-24 NOTE — Patient Instructions (Signed)
Medication Instructions:   Your physician recommends that you continue on your current medications as directed. Please refer to the Current Medication list given to you today.  *If you need a refill on your cardiac medications before your next appointment, please call your pharmacy*  Lab Work:  NONE ordered at this time of appointment   If you have labs (blood work) drawn today and your tests are completely normal, you will receive your results only by: . MyChart Message (if you have MyChart) OR . A paper copy in the mail If you have any lab test that is abnormal or we need to change your treatment, we will call you to review the results.  Testing/Procedures:  NONE ordered at this time of appointment   Follow-Up: At [facility], you and your health needs are our priority.  As part of our continuing mission to provide you with exceptional heart care, we have created designated Provider Care Teams.  These Care Teams include your primary Cardiologist (physician) and Advanced Practice Providers (APPs -  Physician Assistants and Nurse Practitioners) who all work together to provide you with the care you need, when you need it.  Your next appointment:   6 month(s)  The format for your next appointment:   Either In Person or Virtual  Provider:   Peter Jordan, MD  Other Instructions   

## 2019-08-24 NOTE — Progress Notes (Signed)
Cardiology Office Note:    Date:  08/24/2019   ID:  Billy JacquetRobert E Lundy, DOB 08/28/1955, MRN [id number]  PCP:  Oval Linseyondiego, Richard, MD  Cardiologist:  Peter SwazilandJordan, MD  Electrophysiologist:  None   Referring MD: Adrian PrinceSouth, Stephen, MD   Chief Complaint  Patient presents with  . Follow-up    annual followup, seen for Dr. SwazilandJordan.    History of Present Illness:    [name] is a 63 y.o. male with a hx of CAD s/p anterior STEMI 2000 s/p DES to LAD and CABG x 3 in 2012 , h/o cardiac arrest with v fib (repeat cath showed 50% ISR treated with PTCA and ICD implantation, hypertension, hyperlipidemia, and history of ischemic cardiomyopathy.  Initial ICD complicated by staph bacteremia and lead fracture and was eventually replaced by Dr. Johney FrameAllred in 2014.  EF was initially 35% however later improved to 40-45% on echocardiogram in June 2018.  Myoview obtained in July 2018 showed no ischemia.  He had recurrent STEMI in February 2019.  Last cardiac catheterization was performed in February 2019 that showed severe three-vessel CAD, patent LIMA to LAD, patent SVG to OM 2, patent SVG to PDA, moderate to severe LV dysfunction with EF of 35%.  Negative proximal left circumflex had a 75% lesion and OM1 had 80% lesion, both were treated with separate drug-eluting stents.  Echocardiogram obtained on the following day showed EF down to 25-30%, akinesis of the anteroseptal and apical myocardium, grade 2 DD.  He is intolerant of Entresto due to we rash.  He had hyperkalemia on spironolactone.  Most recent device interrogation obtained in November 2020 showed normal thoracic impedance.  Patient presented today for delayed 1 year follow-up.  Overall he has been doing good for the past year.  He denies any chest pain or shortness of breath.  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  He does mention that he is no longer taking the Brilinta.  He did finish 1 year course of Brilinta then stop after  that.  Unfortunately I do not see any lab work since earlier last year however patient mentioned he just went to his PCPs office a month ago and had all the lab work drawn.  We will request fasting lipid panel and a basic metabolic panel from PCPs office.  Otherwise he has no lower extremity edema, orthopnea or PND.  EKG continue to show paced rhythm.  His pacemaker is in the right upper pectoral space.   Past Medical History:  Diagnosis Date  . Adrenal mass (HCC)    currently being evaluated -- 4.1 cm right adrenal mass  . Anemia   . CAD (coronary artery disease)    2000-STEMI, DES to LAD, 1wk later VF arrest d/t 50% ISR, 2012- CABG X3, 10/16/17 STEMI with VT-, LHC patnet graft, DES to 1st OM & Circ  . Cardiac arrest - ventricular fibrillation 09/02/2008   a. s/p MDT dual chamber ICD  . History of methicillin resistant staphylococcus aureus (MRSA)    History of methicillin-sensitive Staphylococcus aureus bacteremia  with implantable cardioverter-defibrillator explant, February 23, 2009  . HL (hearing loss)   . Hyperlipidemia   . Hypertension   . Ischemic cardiomyopathy    severe ischemic cardiomyopathy -- ejection fraction 30-35%  . Pericarditis    history of acute pericarditis in July 2010 now resolved  . S/P CABG (coronary artery bypass graft) Sept 2012    Past Surgical History:  Procedure Laterality Date  .  ANKLE SURGERY    . CARDIAC CATHETERIZATION  08/26/2008   Est. EF of 45% -- Successful intracoronary stenting of the mid-LAD -- Three-vessel atherosclerotic coronary artery disease.  The patient has occlusion of the mid LAD, which is his culprit lesion.  He has  moderate disease in the proximal circumflex and PDA -- Moderate left ventricular dysfunction -- [name], [name]  . CORONARY ARTERY BYPASS GRAFT  05/07/2011   CABG x3:  LIMA to LAD, SVG to OM2, SVG to PDA  . CORONARY STENT INTERVENTION N/A 10/15/2017   Procedure: CORONARY STENT INTERVENTION;  Surgeon: Swaziland, Peter M, MD;   Location: [facility] INVASIVE CV LAB;  Service: Cardiovascular;  Laterality: N/A;  . ICD implant  02/23/2009    with subsequent extraction for MSSA bacteremia  . ICD reimplantation  05/09/2009   status post implant of a Medtronic Maximo II DR dual- chamber cardioverter defibrillator by Dr. Hillis Range  . LEAD REVISION Left 03/05/2013   RV lead fracture.  A new [employer] [id number] lead was placed  . LEFT HEART CATH AND CORS/GRAFTS ANGIOGRAPHY N/A 10/15/2017   Procedure: LEFT HEART CATH AND CORS/GRAFTS ANGIOGRAPHY;  Surgeon: Swaziland, Peter M, MD;  Location: [facility] INVASIVE CV LAB;  Service: Cardiovascular;  Laterality: N/A;    Current Medications: Current Meds  Medication Sig  . aspirin EC 81 MG tablet Take 1 tablet (81 mg total) by mouth daily.  Marland Kitchen atorvastatin (LIPITOR) 80 MG tablet Take 1 tablet (80 mg total) by mouth at bedtime.  . carvedilol (COREG) 25 MG tablet TAKE 1 TABLET BY MOUTH TWICE DAILY WITH A MEAL  . nitroGLYCERIN (NITROSTAT) 0.4 MG SL tablet Place 1 tablet (0.4 mg total) under the tongue every 5 (five) minutes x 3 doses as needed for chest pain. IF NEEDED  . ramipril (ALTACE) 10 MG capsule Take 1 capsule (10 mg total) by mouth 2 (two) times daily. No further refills without seeing primary cardiologist ([place]) or electrophysiologist (Seiler/Allred)  Attempt x 1  . spironolactone (ALDACTONE) 25 MG tablet TAKE 1 TABLET(25 MG) BY MOUTH DAILY  . [DISCONTINUED] ramipril (ALTACE) 10 MG capsule Take 1 capsule (10 mg total) by mouth 2 (two) times daily. No further refills without seeing primary cardiologist ([place]) or electrophysiologist (Seiler/Allred)  Attempt x 1  . [DISCONTINUED] ticagrelor (BRILINTA) 90 MG TABS tablet Take 1 tablet (90 mg total) by mouth 2 (two) times daily.     Allergies:   Sacubitril-valsartan and Morphine and related   Social History   Socioeconomic History  . Marital status: Married    Spouse name: Not on file  . Number of children: 3  . Years of education: Not  on file  . Highest education level: Not on file  Occupational History  . Occupation: Multimedia programmer  Tobacco Use  . Smoking status: Former Smoker    Years: 37.00    Types: Cigarettes    Quit date: 10/14/2017    Years since quitting: 1.8  . Smokeless tobacco: Never Used  . Tobacco comment: 1 pack per week. Has been smoking for 35 years.   Substance and Sexual Activity  . Alcohol use: No  . Drug use: No  . Sexual activity: Not on file  Other Topics Concern  . Not on file  Social History Narrative   SOCIAL HISTORY:  He is married for 37 years with children.  He smokes 1 pack per day. He has been smoking for 37 years.  He denies alcohol use.  He works as  a  Dealer.         FAMILY HISTORY:  Father has a history of coronary artery disease. Mother died in her 52s with a stroke.  He has 2 sisters who are alive and well.    Social Determinants of Health   Financial Resource Strain:   . Difficulty of Paying Living Expenses: Not on file  Food Insecurity:   . Worried About Charity fundraiser in the Last Year: Not on file  . Ran Out of Food in the Last Year: Not on file  Transportation Needs:   . Lack of Transportation (Medical): Not on file  . Lack of Transportation (Non-Medical): Not on file  Physical Activity:   . Days of Exercise per Week: Not on file  . Minutes of Exercise per Session: Not on file  Stress:   . Feeling of Stress : Not on file  Social Connections:   . Frequency of Communication with Friends and Family: Not on file  . Frequency of Social Gatherings with Friends and Family: Not on file  . Attends Religious Services: Not on file  . Active Member of Clubs or Organizations: Not on file  . Attends Archivist Meetings: Not on file  . Marital Status: Not on file     Family History: The patient's family history includes Coronary artery disease in his father; Stroke in his mother.  ROS:   Please see the history of present illness.     All other  systems reviewed and are negative.  EKGs/Labs/Other Studies Reviewed:    The following studies were reviewed today:  Cath 10/15/2017  Mid RCA lesion is 100% stenosed.  Prox Cx lesion is 75% stenosed.  A drug-eluting stent was successfully placed using a STENT SYNERGY DES 3X12.  Post intervention, there is a 0% residual stenosis.  Ost Cx to Prox Cx lesion is 45% stenosed.  Ost 1st Mrg lesion is 90% stenosed.  1st Mrg lesion is 80% stenosed.  A drug-eluting stent was successfully placed using a STENT SYNERGY DES 2.25X24.  Post intervention, there is a 0% residual stenosis.  Prox LAD to Mid LAD lesion is 70% stenosed.  Dist LAD lesion is 90% stenosed.  LIMA graft was visualized by angiography and is normal in caliber.  The graft exhibits no disease.  SVG graft was visualized by angiography and is normal in caliber.  The graft exhibits no disease.  SVG graft was visualized by angiography and is normal in caliber.  The graft exhibits no disease.  There is moderate to severe left ventricular systolic dysfunction.  LV end diastolic pressure is normal.   1. Severe 3 vessel obstructive CAD 2. Patent LIMA to the LAD 3. Patent SVG to the second OM 4. Patent SVG to the PDA. 5. Moderate to severe LV dysfunction. EF estimated at 35% 6. Normal LVEDP 7. Successful stenting of the first OM and proximal LCx with DES x 2  Plan: the first OM was felt to be the culprit lesion. It appeared hazy. There is severe stenosis in the distal LAD but this LV distribution appears akinetic/scarred. Will continue DAPT for one year. Risk factor modification including smoking cessation.    Echo 10/16/2017 LV EF: 25% -   30%  Study Conclusions  - Left ventricle: The cavity size was normal. Wall thickness was   increased in a pattern of mild LVH. Systolic function was   severely reduced. The estimated ejection fraction was in the   range of 25% to  30%. There is akinesis of the anteroseptal  and   apical myocardium. Features are consistent with a pseudonormal   left ventricular filling pattern, with concomitant abnormal   relaxation and increased filling pressure (grade 2 diastolic   dysfunction). - Aortic valve: There was trivial regurgitation. - Mitral valve: Calcified annulus. - Left atrium: The atrium was mildly dilated.  Impressions:  - Akinesis of the distal anteroseptal and apical walls with overall   severely reduced LV systolic function; moderate diastolic   dysfunction; trace AI; mild LAE.   EKG:  EKG is ordered today.  The ekg ordered today demonstrates atrial paced rhythm  Recent Labs: No results found for requested labs within last 8760 hours.  Recent Lipid Panel    Component Value Date/Time   CHOL 120 02/07/2017 0839   TRIG 100 02/07/2017 0839   HDL 27 (L) 02/07/2017 0839   CHOLHDL 4.4 02/07/2017 0839   CHOLHDL 4.7 03/11/2016 0818   VLDL 16 03/11/2016 0818   LDLCALC 73 02/07/2017 0839   LDLDIRECT 217.3 04/04/2011 1124    Physical Exam:    VS:  BP 115/77   Pulse 84   Ht  (1.702 m)   Wt 166 lb 12.8 oz (75.7 kg)   SpO2 99%   BMI 26.12 kg/m     Wt Readings from Last 3 Encounters:  08/24/19 166 lb 12.8 oz (75.7 kg)  10/21/18 169 lb 6.4 oz (76.8 kg)  04/22/18 170 lb 3.2 oz (77.2 kg)     GEN:  Well nourished, well developed in no acute distress HEENT: Normal NECK: No JVD; No carotid bruits LYMPHATICS: No lymphadenopathy CARDIAC: RRR, no murmurs, rubs, gallops RESPIRATORY:  Clear to auscultation without rales, wheezing or rhonchi  ABDOMEN: Soft, non-tender, non-distended MUSCULOSKELETAL:  No edema; No deformity  SKIN: Warm and dry NEUROLOGIC:  Alert and oriented x 3 PSYCHIATRIC:  Normal affect   ASSESSMENT:    1. Coronary artery disease involving coronary bypass graft of native heart without angina pectoris   2. Cardiomyopathy, ischemic   3. Implantable cardioverter-defibrillator (ICD) in situ   4. Essential hypertension     5. Hypercholesterolemia    PLAN:    In order of problems listed above:  1. CAD s/p CABG: Denies any recent obvious anginal symptoms.  He finished 1 year course of Brilinta and has discontinued the medication since.  He is still on lifelong aspirin and Lipitor.  2. Ischemic cardiomyopathy status post Medtronic ICD: Followed by EP service.  Most recent device interrogation in November 2020 demonstrated normal impedance.  3. Hypertension: Blood pressure stable on current therapy.  We will request the lab work from his PCP's office as he had a history of hyperkalemia on spironolactone in the past and he is still on the spironolactone.  4. Hyperlipidemia: Continue Lipitor.  Request the last lipid panel from PCP's office.  Apparently he had lab work done a month ago.   Medication Adjustments/Labs and Tests Ordered: Current medicines are reviewed at length with the patient today.  Concerns regarding medicines are outlined above.  Orders Placed This Encounter  Procedures  . EKG 12-Lead   Meds ordered this encounter  Medications  . ramipril (ALTACE) 10 MG capsule    Sig: Take 1 capsule (10 mg total) by mouth 2 (two) times daily. No further refills without seeing primary cardiologist ([place]) or electrophysiologist (Seiler/Allred)  Attempt x 1    Dispense:  180 capsule    Refill:  2    Patient Instructions  Medication Instructions:  Your physician recommends that you continue on your current medications as directed. Please refer to the Current Medication list given to you today.  *If you need a refill on your cardiac medications before your next appointment, please call your pharmacy*  Lab Work: NONE ordered at this time of appointment   If you have labs (blood work) drawn today and your tests are completely normal, you will receive your results only by: Marland Kitchen MyChart Message (if you have MyChart) OR . A paper copy in the mail If you have any lab test that is abnormal or we need to  change your treatment, we will call you to review the results.  Testing/Procedures: NONE ordered at this time of appointment   Follow-Up: At [facility], you and your health needs are our priority.  As part of our continuing mission to provide you with exceptional heart care, we have created designated Provider Care Teams.  These Care Teams include your primary Cardiologist (physician) and Advanced Practice Providers (APPs -  Physician Assistants and Nurse Practitioners) who all work together to provide you with the care you need, when you need it.  Your next appointment:   6 month(s)  The format for your next appointment:   Either In Person or Virtual  Provider:   Peter Swaziland, MD  Other Instructions      Signed, Azalee Course, PA  08/24/2019 8:38 AM    [facility]

## 2019-09-15 NOTE — Progress Notes (Signed)
Electrophysiology Office Note Date: 09/16/2019  ID:  [name], DOB 1956-06-04, MRN [id number]  PCP: Lucia Gaskins, MD Primary Cardiologist: Martinique Electrophysiologist: Allred  CC: Routine ICD follow-up  [name] is a 65 y.o. male seen today for Dr Rayann Heman.  He presents today for routine electrophysiology followup.  Since last being seen in our clinic, the patient reports doing very well. He remains very active without ischemic symptoms.  He denies chest pain, palpitations, dyspnea, PND, orthopnea, nausea, vomiting, dizziness, syncope, edema, weight gain, or early satiety.  He has not had ICD shocks.   Device History: MDT dual chamber ICD implanted 2010 for VF arrest (extraction and re-implantation same year for MSSA bacteremia); gen change 2014 with new RV lead for RV lead fracture History of appropriate therapy: No History of AAD therapy: No   Past Medical History:  Diagnosis Date  . Adrenal mass ([place])    currently being evaluated -- 4.1 cm right adrenal mass  . Anemia   . CAD (coronary artery disease)    2000-STEMI, DES to LAD, 1wk later VF arrest d/t 50% ISR, 2012- CABG X3, 10/16/17 STEMI with VT-, LHC patnet graft, DES to 1st OM & Circ  . Cardiac arrest - ventricular fibrillation 09/02/2008   a. s/p MDT dual chamber ICD  . History of methicillin resistant staphylococcus aureus (MRSA)    History of methicillin-sensitive Staphylococcus aureus bacteremia  with implantable cardioverter-defibrillator explant, February 23, 2009  . HL (hearing loss)   . Hyperlipidemia   . Hypertension   . Ischemic cardiomyopathy    severe ischemic cardiomyopathy -- ejection fraction 30-35%  . Pericarditis    history of acute pericarditis in July 2010 now resolved  . S/P CABG (coronary artery bypass graft) Sept 2012   Past Surgical History:  Procedure Laterality Date  . ANKLE SURGERY    . CARDIAC CATHETERIZATION  08/26/2008   Est. EF of 45% -- Successful intracoronary stenting  of the mid-LAD -- Three-vessel atherosclerotic coronary artery disease.  The patient has occlusion of the mid LAD, which is his culprit lesion.  He has  moderate disease in the proximal circumflex and PDA -- Moderate left ventricular dysfunction -- Peter M. Martinique, [name]  . [place] GRAFT  05/07/2011   CABG x3:  LIMA to LAD, SVG to OM2, SVG to PDA  . CORONARY STENT INTERVENTION N/A 10/15/2017   Procedure: CORONARY STENT INTERVENTION;  Surgeon: Martinique, Peter M, MD;  Location: [place] [facility];  Service: Cardiovascular;  Laterality: N/A;  . ICD implant  02/23/2009    with subsequent extraction for MSSA bacteremia  . ICD reimplantation  05/09/2009   status post implant of a Medtronic Maximo II DR dual- chamber cardioverter defibrillator by Dr. Thompson Grayer  . LEAD REVISION Left 03/05/2013   RV lead fracture.  A new [place] lead was placed  . LEFT HEART CATH AND CORS/GRAFTS ANGIOGRAPHY N/A 10/15/2017   Procedure: LEFT HEART CATH AND CORS/GRAFTS ANGIOGRAPHY;  Surgeon: Martinique, Peter M, MD;  Location: [place] [facility];  Service: Cardiovascular;  Laterality: N/A;    Current Outpatient Medications  Medication Sig Dispense Refill  . aspirin EC 81 MG tablet Take 1 tablet (81 mg total) by mouth daily. 90 tablet 3  . atorvastatin (LIPITOR) 80 MG tablet Take 1 tablet (80 mg total) by mouth at bedtime. 90 tablet 0  . carvedilol (COREG) 25 MG tablet TAKE 1 TABLET BY MOUTH TWICE DAILY WITH A MEAL 180 tablet 1  .  nitroGLYCERIN (NITROSTAT) 0.4 MG SL tablet Place 1 tablet (0.4 mg total) under the tongue every 5 (five) minutes x 3 doses as needed for chest pain. IF NEEDED 25 tablet 6  . ramipril (ALTACE) 10 MG capsule Take 1 capsule (10 mg total) by mouth 2 (two) times daily. No further refills without seeing primary cardiologist ([place]) or electrophysiologist (Nicolis Boody/Allred)  Attempt x 1 180 capsule 2  . spironolactone (ALDACTONE) 25 MG tablet TAKE 1 TABLET(25 MG) BY MOUTH DAILY 90  tablet 3   No current facility-administered medications for this visit.    Allergies:   Sacubitril-valsartan and Morphine and related   Social History: Social History   Socioeconomic History  . Marital status: Married    Spouse name: Not on file  . Number of children: 3  . Years of education: Not on file  . Highest education level: Not on file  Occupational History  . Occupation: Multimedia programmer  Tobacco Use  . Smoking status: Former Smoker    Years: 37.00    Types: Cigarettes    Quit date: 10/14/2017    Years since quitting: 1.9  . Smokeless tobacco: Never Used  . Tobacco comment: 1 pack per week. Has been smoking for 35 years.   Substance and Sexual Activity  . Alcohol use: No  . Drug use: No  . Sexual activity: Not on file  Other Topics Concern  . Not on file  Social History Narrative   SOCIAL HISTORY:  He is married for 37 years with children.  He smokes 1 pack per day. He has been smoking for 37 years.  He denies alcohol use.  He works as a  Curator.         FAMILY HISTORY:  Father has a history of coronary artery disease. Mother died in her 43s with a stroke.  He has 2 sisters who are alive and well.    Social Determinants of Health   Financial Resource Strain:   . Difficulty of Paying Living Expenses: Not on file  Food Insecurity:   . Worried About Programme researcher, broadcasting/film/video in the Last Year: Not on file  . Ran Out of Food in the Last Year: Not on file  Transportation Needs:   . Lack of Transportation (Medical): Not on file  . Lack of Transportation (Non-Medical): Not on file  Physical Activity:   . Days of Exercise per Week: Not on file  . Minutes of Exercise per Session: Not on file  Stress:   . Feeling of Stress : Not on file  Social Connections:   . Frequency of Communication with Friends and Family: Not on file  . Frequency of Social Gatherings with Friends and Family: Not on file  . Attends Religious Services: Not on file  . Active Member of Clubs  or Organizations: Not on file  . Attends Banker Meetings: Not on file  . Marital Status: Not on file  Intimate Partner Violence:   . Fear of Current or Ex-Partner: Not on file  . Emotionally Abused: Not on file  . Physically Abused: Not on file  . Sexually Abused: Not on file    Family History: Family History  Problem Relation Age of Onset  . Stroke Mother   . Coronary artery disease Father     Review of Systems: All other systems reviewed and are otherwise negative except as noted above.   Physical Exam: VS:  BP 102/60   Pulse 82   Ht 5\' 7"  (  1.702 m)   Wt 166 lb 3.2 oz (75.4 kg)   SpO2 97%   BMI 26.03 kg/m  , BMI Body mass index is 26.03 kg/m.  GEN- The patient is well appearing, alert and oriented x 3 today.   HEENT: normocephalic, atraumatic; sclera clear, conjunctiva pink; hearing intact; oropharynx clear; neck supple  Lungs- Clear to ausculation bilaterally, normal work of breathing.  No wheezes, rales, rhonchi Heart- Regular rate and rhythm  GI- soft, non-tender, non-distended, bowel sounds present  Extremities- no clubbing, cyanosis, or edema;  MS- no significant deformity or atrophy Skin- warm and dry, no rash or lesion; ICD pocket well healed Psych- euthymic mood, full affect Neuro- strength and sensation are intact  ICD interrogation- reviewed in detail today,  See PACEART report  EKG:  EKG is not ordered today.  Recent Labs: No results found for requested labs within last 8760 hours.   Wt Readings from Last 3 Encounters:  09/16/19 166 lb 3.2 oz (75.4 kg)  08/24/19 166 lb 12.8 oz (75.7 kg)  10/21/18 169 lb 6.4 oz (76.8 kg)     Other studies Reviewed: Additional studies/ records that were reviewed today include: Hao's office notes   Assessment and Plan:  1.  Chronic systolic dysfunction euvolemic today Stable on an appropriate medical regimen Normal ICD function RV threshold 1.75V@0 . , stable See Pace Art report No changes  today Continue follow up in [facility] BMET followed by PCP - will request labs  2.  CAD s/p CABG Stable No change required today  3.  VT Prior VF arrest No recent recurrence    Current medicines are reviewed at length with the patient today.   The patient does not have concerns regarding his medicines.  The following changes were made today:  none  Labs/ tests ordered today include:  No orders of the defined types were placed in this encounter.    Disposition:   Follow up with remotes, me in 1 year     Signed, Gypsy Balsam, NP 09/16/2019 8:52 AM  [facility] [address] [place] [address] [phone] (office) [phone] (fax)

## 2019-09-16 ENCOUNTER — Ambulatory Visit (INDEPENDENT_AMBULATORY_CARE_PROVIDER_SITE_OTHER): Payer: Medicare Other | Admitting: Nurse Practitioner

## 2019-09-16 ENCOUNTER — Encounter: Payer: Self-pay | Admitting: Nurse Practitioner

## 2019-09-16 ENCOUNTER — Other Ambulatory Visit: Payer: Self-pay

## 2019-09-16 VITALS — BP 102/60 | HR 82 | Ht 67.0 in | Wt 166.2 lb

## 2019-09-16 DIAGNOSIS — I4729 Other ventricular tachycardia: Secondary | ICD-10-CM

## 2019-09-16 DIAGNOSIS — I25810 Atherosclerosis of coronary artery bypass graft(s) without angina pectoris: Secondary | ICD-10-CM

## 2019-09-16 DIAGNOSIS — I472 Ventricular tachycardia: Secondary | ICD-10-CM | POA: Diagnosis not present

## 2019-09-16 DIAGNOSIS — I5022 Chronic systolic (congestive) heart failure: Secondary | ICD-10-CM | POA: Diagnosis not present

## 2019-09-16 NOTE — Patient Instructions (Addendum)
Medication Instructions:  none *If you need a refill on your cardiac medications before your next appointment, please call your pharmacy*  Lab Work: none If you have labs (blood work) drawn today and your tests are completely normal, you will receive your results only by: Marland Kitchen MyChart Message (if you have MyChart) OR . A paper copy in the mail If you have any lab test that is abnormal or we need to change your treatment, we will call you to review the results.  Testing/Procedures: none  Follow-Up: At [facility], you and your health needs are our priority.  As part of our continuing mission to provide you with exceptional heart care, we have created designated Provider Care Teams.  These Care Teams include your primary Cardiologist (physician) and Advanced Practice Providers (APPs -  Physician Assistants and Nurse Practitioners) who all work together to provide you with the care you need, when you need it.  Your next appointment:   1 year(s)  The format for your next appointment:   In Person  Provider:  Gypsy Balsam, NP    Other Instructions Remote monitoring is used to monitor your  ICD from home. This monitoring reduces the number of office visits required to check your device to one time per year. It allows [place] to keep an eye on the functioning of your device to ensure it is working properly. You are scheduled for a device check from home on 11/24/19. You may send your transmission at any time that day. If you have a wireless device, the transmission will be sent automatically. After your physician reviews your transmission, you will receive a postcard with your next transmission date.

## 2019-09-17 LAB — CUP PACEART INCLINIC DEVICE CHECK
Date Time Interrogation Session: 20210121132721
Implantable Lead Implant Date: 20100414
Implantable Lead Implant Date: 20140711
Implantable Lead Location: 753859
Implantable Lead Location: 753860
Implantable Lead Model: 181
Implantable Lead Model: 5076
Implantable Lead Serial Number: 323897
Implantable Pulse Generator Implant Date: 20140711

## 2019-09-21 ENCOUNTER — Telehealth: Payer: Self-pay

## 2019-09-21 NOTE — Telephone Encounter (Signed)
Unable to speak  with patient to remind of missed remote transmission 

## 2019-09-27 NOTE — Progress Notes (Signed)
No ICM remote transmission received for 09/20/2019 and next ICM transmission scheduled for 10/18/2019.   

## 2019-09-28 ENCOUNTER — Other Ambulatory Visit: Payer: Self-pay | Admitting: Cardiology

## 2019-09-28 DIAGNOSIS — I5022 Chronic systolic (congestive) heart failure: Secondary | ICD-10-CM

## 2019-09-28 DIAGNOSIS — I4901 Ventricular fibrillation: Secondary | ICD-10-CM

## 2019-09-28 DIAGNOSIS — I255 Ischemic cardiomyopathy: Secondary | ICD-10-CM

## 2019-09-28 NOTE — Telephone Encounter (Signed)
Rx has been sent to the pharmacy electronically. ° °

## 2019-09-30 NOTE — Progress Notes (Signed)
Electrophysiology Office Note Date: 10/01/2019  ID:  [name], DOB [birth year], MRN [id number]  PCP: Lucia Gaskins, MD Primary Cardiologist: Peter Martinique, MD Electrophysiologist: Dr. Rayann Heman   CC: Barostim Consideration  [name] is a 64 y.o. male seen today for Dr. Rayann Heman.  They present today for Barostim screening. Since last being seen in our clinic, the patient reports doing very well. He has no limitation at this time on his functional capacity. He is very active, and has not met a barrier of activity that causes him SOB. He denies chest pain, palpitations, dyspnea, PND, orthopnea, nausea, vomiting, dizziness, syncope, edema, weight gain, or early satiety.  He has not had ICD shocks.   Device History: Medtronic Dual Chamber ICD implanted 2010 for VF arrest (extraction and re-implantation same year for MSSA bacteremia); gen change 2014 with new RV lead for RV lead fracture History of appropriate therapy: No History of AAD therapy: No   Past Medical History:  Diagnosis Date  . Adrenal mass ([place])    currently being evaluated -- 4.1 cm right adrenal mass  . Anemia   . CAD (coronary artery disease)    2000-STEMI, DES to LAD, 1wk later VF arrest d/t 50% ISR, 2012- CABG X3, 10/16/17 STEMI with VT-, LHC patnet graft, DES to 1st OM & Circ  . Cardiac arrest - ventricular fibrillation 09/02/2008   a. s/p MDT dual chamber ICD  . History of methicillin resistant staphylococcus aureus (MRSA)    History of methicillin-sensitive Staphylococcus aureus bacteremia  with implantable cardioverter-defibrillator explant, February 23, 2009  . HL (hearing loss)   . Hyperlipidemia   . Hypertension   . Ischemic cardiomyopathy    severe ischemic cardiomyopathy -- ejection fraction 30-35%  . Pericarditis    history of acute pericarditis in July 2010 now resolved  . S/P CABG (coronary artery bypass graft) Sept 2012   Past Surgical History:  Procedure Laterality Date  . ANKLE SURGERY     . CARDIAC CATHETERIZATION  08/26/2008   Est. EF of 45% -- Successful intracoronary stenting of the mid-LAD -- Three-vessel atherosclerotic coronary artery disease.  The patient has occlusion of the mid LAD, which is his culprit lesion.  He has  moderate disease in the proximal circumflex and PDA -- Moderate left ventricular dysfunction -- Peter M. Martinique, [name]  . [place] GRAFT  05/07/2011   CABG x3:  LIMA to LAD, SVG to OM2, SVG to PDA  . CORONARY STENT INTERVENTION N/A 10/15/2017   Procedure: CORONARY STENT INTERVENTION;  Surgeon: Martinique, Peter M, MD;  Location: [facility];  Service: Cardiovascular;  Laterality: N/A;  . ICD implant  02/23/2009    with subsequent extraction for MSSA bacteremia  . ICD reimplantation  05/09/2009   status post implant of a Medtronic Maximo II DR dual- chamber cardioverter defibrillator by Dr. Thompson Grayer  . LEAD REVISION Left 03/05/2013   RV lead fracture.  A new [place] lead was placed  . LEFT HEART CATH AND CORS/GRAFTS ANGIOGRAPHY N/A 10/15/2017   Procedure: LEFT HEART CATH AND CORS/GRAFTS ANGIOGRAPHY;  Surgeon: Martinique, Peter M, MD;  Location: [place] [facility];  Service: Cardiovascular;  Laterality: N/A;    Current Outpatient Medications  Medication Sig Dispense Refill  . aspirin EC 81 MG tablet Take 1 tablet (81 mg total) by mouth daily. 90 tablet 3  . atorvastatin (LIPITOR) 80 MG tablet Take 1 tablet (80 mg total) by mouth at bedtime. 90 tablet 0  .  carvedilol (COREG) 25 MG tablet TAKE 1 TABLET BY MOUTH TWICE DAILY WITH A MEAL 180 tablet 1  . nitroGLYCERIN (NITROSTAT) 0.4 MG SL tablet Place 1 tablet (0.4 mg total) under the tongue every 5 (five) minutes x 3 doses as needed for chest pain. IF NEEDED 25 tablet 6  . ramipril (ALTACE) 10 MG capsule Take 1 capsule (10 mg total) by mouth 2 (two) times daily. No further refills without seeing primary cardiologist (Martinique) or electrophysiologist ([place]/Allred)  Attempt x 1 180  capsule 2  . spironolactone (ALDACTONE) 25 MG tablet TAKE 1 TABLET(25 MG) BY MOUTH DAILY 90 tablet 2   No current facility-administered medications for this visit.    Allergies:   Sacubitril-valsartan and Morphine and related   Social History: Social History   Socioeconomic History  . Marital status: Married    Spouse name: Not on file  . Number of children: 3  . Years of education: Not on file  . Highest education level: Not on file  Occupational History  . Occupation: Clinical biochemist  Tobacco Use  . Smoking status: Former Smoker    Years: 37.00    Types: Cigarettes    Quit date: 10/14/2017    Years since quitting: 1.9  . Smokeless tobacco: Never Used  . Tobacco comment: 1 pack per week. Has been smoking for 35 years.   Substance and Sexual Activity  . Alcohol use: No  . Drug use: No  . Sexual activity: Not on file  Other Topics Concern  . Not on file  Social History Narrative   SOCIAL HISTORY:  He is married for 37 years with children.  He smokes 1 pack per day. He has been smoking for 37 years.  He denies alcohol use.  He works as a  Dealer.         FAMILY HISTORY:  Father has a history of coronary artery disease. Mother died in her 74s with a stroke.  He has 2 sisters who are alive and well.    Social Determinants of Health   Financial Resource Strain:   . Difficulty of Paying Living Expenses: Not on file  Food Insecurity:   . Worried About Charity fundraiser in the Last Year: Not on file  . Ran Out of Food in the Last Year: Not on file  Transportation Needs:   . Lack of Transportation (Medical): Not on file  . Lack of Transportation (Non-Medical): Not on file  Physical Activity:   . Days of Exercise per Week: Not on file  . Minutes of Exercise per Session: Not on file  Stress:   . Feeling of Stress : Not on file  Social Connections:   . Frequency of Communication with Friends and Family: Not on file  . Frequency of Social Gatherings with Friends  and Family: Not on file  . Attends Religious Services: Not on file  . Active Member of Clubs or Organizations: Not on file  . Attends Archivist Meetings: Not on file  . Marital Status: Not on file  Intimate Partner Violence:   . Fear of Current or Ex-Partner: Not on file  . Emotionally Abused: Not on file  . Physically Abused: Not on file  . Sexually Abused: Not on file    Family History: Family History  Problem Relation Age of Onset  . Stroke Mother   . Coronary artery disease Father     Review of Systems: All other systems reviewed and are otherwise negative except  as noted above.   Physical Exam: Vitals:   10/01/19 0802  BP: 116/86  Pulse: 72  Weight: 170 lb 6.4 oz (77.3 kg)  Height: _0  (1.702 m)     GEN- The patient is well appearing, alert and oriented x 3 today.   HEENT: normocephalic, atraumatic; sclera clear, conjunctiva pink; hearing intact; oropharynx clear; neck supple, no JVP Lymph- no cervical lymphadenopathy Lungs- Clear to ausculation bilaterally, normal work of breathing.  No wheezes, rales, rhonchi Heart- Regular rate and rhythm, no murmurs, rubs or gallops, PMI not laterally displaced GI- soft, non-tender, non-distended, bowel sounds present, no hepatosplenomegaly Extremities- no clubbing, cyanosis, or edema; DP/PT/radial pulses 2+ bilaterally MS- no significant deformity or atrophy Skin- warm and dry, no rash or lesion; ICD pocket well healed Psych- euthymic mood, full affect Neuro- strength and sensation are intact  ICD interrogation- reviewed in detail today,  See PACEART report  EKG:  EKG is not ordered today.  The ekg ordered 08/24/2019 shows A paced at 84 bpm and QRS 94 ms  Recent Labs: No results found for requested labs within last 8760 hours.   Wt Readings from Last 3 Encounters:  10/01/19 170 lb 6.4 oz (77.3 kg)  09/16/19 166 lb 3.2 oz (75.4 kg)  08/24/19 166 lb 12.8 oz (75.7 kg)     Other studies Reviewed:  Additional studies/ records that were reviewed today include: Previous EP notes, Previous [facility] notes, Most recent labwork, most recent EKG   Assessment and Plan:  1.  Chronic systolic dysfunction s/p St. Jude dual chamber ICD  euvolemic today Stable on an appropriate medical regimen Normal ICD function Reviewed PaceArt report from 09/16/19. Chronically elevated threshold.  No changes today Continue ICM clinic Narrow QRS not CRT candidate. NYHA I-early II symptoms at this time.  He is not currently a candidate for Barostim/Batwire. His body habitus would lend him toward batwire if he were.  He would be interested in the future were his functional status to change.  Of note, he has a RIGHT sided ICD in the setting of previous device infection which may be a barrier to implant. Will discuss with Dr. Rayann Heman and Dr. Trula Slade.  Will get ProBNP for baseline.   2. CAD s/p CABG Denies any ischemic symptoms Continue current medications  3. H/o VT Prior VF arrest. No recent recurrence.   4. HTN Stable on current medication  5. HDL Per [facility]  Current medicines are reviewed at length with the patient today.   The patient does not have concerns regarding his medicines.  The following changes were made today:  none  Labs/ tests ordered today include:  Orders Placed This Encounter  Procedures  . Basic Metabolic Panel (BMET)  . Pro b natriuretic peptide (BNP)   Disposition:   Follow up as scheduled.   Jacalyn Lefevre, PA-C  10/01/2019 8:19 AM  [address] [place] [address] [phone] (office) [phone] (fax)

## 2019-10-01 ENCOUNTER — Ambulatory Visit (INDEPENDENT_AMBULATORY_CARE_PROVIDER_SITE_OTHER): Payer: Medicare Other | Admitting: Student

## 2019-10-01 ENCOUNTER — Other Ambulatory Visit: Payer: Self-pay

## 2019-10-01 ENCOUNTER — Encounter: Payer: Self-pay | Admitting: Student

## 2019-10-01 VITALS — BP 116/86 | HR 72 | Ht 67.0 in | Wt 170.4 lb

## 2019-10-01 DIAGNOSIS — I255 Ischemic cardiomyopathy: Secondary | ICD-10-CM

## 2019-10-01 DIAGNOSIS — I10 Essential (primary) hypertension: Secondary | ICD-10-CM | POA: Diagnosis not present

## 2019-10-01 DIAGNOSIS — I472 Ventricular tachycardia: Secondary | ICD-10-CM

## 2019-10-01 DIAGNOSIS — I5022 Chronic systolic (congestive) heart failure: Secondary | ICD-10-CM | POA: Diagnosis not present

## 2019-10-01 DIAGNOSIS — I4729 Other ventricular tachycardia: Secondary | ICD-10-CM

## 2019-10-01 NOTE — Patient Instructions (Signed)
Medication Instructions:  none *If you need a refill on your cardiac medications before your next appointment, please call your pharmacy*  Lab Work:TODAY BMET BNP If you have labs (blood work) drawn today and your tests are completely normal, you will receive your results only by: Marland Kitchen MyChart Message (if you have MyChart) OR . A paper copy in the mail If you have any lab test that is abnormal or we need to change your treatment, we will call you to review the results.  Testing/Procedures: none  Follow-Up: At [facility], you and your health needs are our priority.  As part of our continuing mission to provide you with exceptional heart care, we have created designated Provider Care Teams.  These Care Teams include your primary Cardiologist (physician) and Advanced Practice Providers (APPs -  Physician Assistants and Nurse Practitioners) who all work together to provide you with the care you need, when you need it.  Other Instructions Remote monitoring is used to monitor your  ICD from home. This monitoring reduces the number of office visits required to check your device to one time per year. It allows [place] to keep an eye on the functioning of your device to ensure it is working properly. You are scheduled for a device check from home on 11/24/19. You may send your transmission at any time that day. If you have a wireless device, the transmission will be sent automatically. After your physician reviews your transmission, you will receive a postcard with your next transmission date.

## 2019-10-02 LAB — BASIC METABOLIC PANEL WITH GFR
BUN/Creatinine Ratio: 12 (ref 10–24)
BUN: 11 mg/dL (ref 8–27)
CO2: 22 mmol/L (ref 20–29)
Calcium: 9 mg/dL (ref 8.6–10.2)
Chloride: 104 mmol/L (ref 96–106)
Creatinine, Ser: 0.9 mg/dL (ref 0.76–1.27)
GFR calc Af Amer: 106 mL/min/1.73
GFR calc non Af Amer: 92 mL/min/1.73
Glucose: 91 mg/dL (ref 65–99)
Potassium: 4.6 mmol/L (ref 3.5–5.2)
Sodium: 140 mmol/L (ref 134–144)

## 2019-10-02 LAB — PRO B NATRIURETIC PEPTIDE: NT-Pro BNP: 627 pg/mL — ABNORMAL HIGH (ref 0–210)

## 2019-10-19 ENCOUNTER — Telehealth: Payer: Self-pay

## 2019-10-19 NOTE — Telephone Encounter (Signed)
Unable to speak  with patient to remind of missed remote transmission 

## 2019-10-27 NOTE — Progress Notes (Signed)
No ICM remote transmission received since 07/12/2019 and patient not participating in ICM monthly follow up.  Disenrolled from monthly ICM.  Device clinic will continue 91 day remote transmission monitoring per protocol.

## 2019-11-01 ENCOUNTER — Ambulatory Visit (INDEPENDENT_AMBULATORY_CARE_PROVIDER_SITE_OTHER): Payer: Medicare Other | Admitting: *Deleted

## 2019-11-01 DIAGNOSIS — Z95810 Presence of automatic (implantable) cardiac defibrillator: Secondary | ICD-10-CM | POA: Diagnosis not present

## 2019-11-01 LAB — CUP PACEART REMOTE DEVICE CHECK
Battery Remaining Longevity: 25 mo
Battery Voltage: 2.93 V
Brady Statistic AP VP Percent: 0.01 %
Brady Statistic AP VS Percent: 98.03 %
Brady Statistic AS VP Percent: 0 %
Brady Statistic AS VS Percent: 1.97 %
Brady Statistic RA Percent Paced: 97.98 %
Brady Statistic RV Percent Paced: 0.01 %
Date Time Interrogation Session: 20210307170710
HighPow Impedance: 59 Ohm
Implantable Lead Implant Date: 20100414
Implantable Lead Implant Date: 20140711
Implantable Lead Location: 753859
Implantable Lead Location: 753860
Implantable Lead Model: 181
Implantable Lead Model: 5076
Implantable Lead Serial Number: 323897
Implantable Pulse Generator Implant Date: 20140711
Lead Channel Impedance Value: 323 Ohm
Lead Channel Impedance Value: 342 Ohm
Lead Channel Impedance Value: 532 Ohm
Lead Channel Pacing Threshold Amplitude: 1.375 V
Lead Channel Pacing Threshold Amplitude: 1.875 V
Lead Channel Pacing Threshold Pulse Width: 0.4 ms
Lead Channel Pacing Threshold Pulse Width: 0.4 ms
Lead Channel Sensing Intrinsic Amplitude: 1.375 mV
Lead Channel Sensing Intrinsic Amplitude: 1.375 mV
Lead Channel Sensing Intrinsic Amplitude: 5.125 mV
Lead Channel Sensing Intrinsic Amplitude: 5.125 mV
Lead Channel Setting Pacing Amplitude: 2.75 V
Lead Channel Setting Pacing Amplitude: 3 V
Lead Channel Setting Pacing Pulse Width: 0.4 ms
Lead Channel Setting Sensing Sensitivity: 0.3 mV

## 2019-11-01 NOTE — Progress Notes (Signed)
ICD Remote  

## 2019-11-22 ENCOUNTER — Other Ambulatory Visit: Payer: Self-pay | Admitting: Adult Health

## 2019-11-22 ENCOUNTER — Other Ambulatory Visit: Payer: Self-pay | Admitting: Nurse Practitioner

## 2019-11-22 DIAGNOSIS — I4901 Ventricular fibrillation: Secondary | ICD-10-CM

## 2019-11-22 DIAGNOSIS — I5022 Chronic systolic (congestive) heart failure: Secondary | ICD-10-CM

## 2019-11-22 DIAGNOSIS — I255 Ischemic cardiomyopathy: Secondary | ICD-10-CM

## 2019-11-26 ENCOUNTER — Telehealth: Payer: Self-pay

## 2019-11-26 DIAGNOSIS — I255 Ischemic cardiomyopathy: Secondary | ICD-10-CM

## 2019-11-26 DIAGNOSIS — I5022 Chronic systolic (congestive) heart failure: Secondary | ICD-10-CM

## 2019-11-26 DIAGNOSIS — I4901 Ventricular fibrillation: Secondary | ICD-10-CM

## 2019-11-26 MED ORDER — SPIRONOLACTONE 25 MG PO TABS: 25.0000 mg | ORAL_TABLET | Freq: Every day | ORAL | 3 refills | Status: DC

## 2019-11-26 MED ORDER — ATORVASTATIN CALCIUM 80 MG PO TABS: 80.0000 mg | ORAL_TABLET | Freq: Every day | ORAL | 3 refills | Status: DC

## 2019-11-26 MED ORDER — CARVEDILOL 25 MG PO TABS: 25.0000 mg | ORAL_TABLET | Freq: Two times a day (BID) | ORAL | 3 refills | Status: DC

## 2019-11-26 MED ORDER — RAMIPRIL 10 MG PO CAPS: 10.0000 mg | ORAL_CAPSULE | Freq: Two times a day (BID) | ORAL | 3 refills | Status: DC

## 2019-11-26 NOTE — Telephone Encounter (Signed)
Pt saw Azalee Course in December of 2020 and has seen EP APP's twice in 2021.  Not sure why medications were refused in refills.  Refilled all Pt cardiac medications as requested.

## 2019-11-26 NOTE — Telephone Encounter (Signed)
The pt states he has been trying to get his blood pressure medication refilled with no luck. He states he needs it and he has ran completely out. I told him I will send Dr. Johney Frame nurse a note to have her call him back. He states he needs a call back today because he do not have any blood pressure medication for the weekend. I told him I will add the comment in my notes.

## 2020-01-28 ENCOUNTER — Other Ambulatory Visit: Payer: Self-pay | Admitting: Adult Health

## 2020-01-28 DIAGNOSIS — I255 Ischemic cardiomyopathy: Secondary | ICD-10-CM

## 2020-01-28 DIAGNOSIS — I5022 Chronic systolic (congestive) heart failure: Secondary | ICD-10-CM

## 2020-01-28 DIAGNOSIS — I4901 Ventricular fibrillation: Secondary | ICD-10-CM

## 2020-02-01 ENCOUNTER — Ambulatory Visit (INDEPENDENT_AMBULATORY_CARE_PROVIDER_SITE_OTHER): Payer: Medicare Other | Admitting: *Deleted

## 2020-02-01 DIAGNOSIS — I4729 Other ventricular tachycardia: Secondary | ICD-10-CM

## 2020-02-01 DIAGNOSIS — I5022 Chronic systolic (congestive) heart failure: Secondary | ICD-10-CM

## 2020-02-01 DIAGNOSIS — I472 Ventricular tachycardia, unspecified: Secondary | ICD-10-CM

## 2020-02-01 LAB — CUP PACEART REMOTE DEVICE CHECK
Battery Remaining Longevity: 24 mo
Battery Voltage: 2.92 V
Brady Statistic AP VP Percent: 0.01 %
Brady Statistic AP VS Percent: 94.89 %
Brady Statistic AS VP Percent: 0 %
Brady Statistic AS VS Percent: 5.11 %
Brady Statistic RA Percent Paced: 94.81 %
Brady Statistic RV Percent Paced: 0.01 %
Date Time Interrogation Session: 20210608052610
HighPow Impedance: 56 Ohm
Implantable Lead Implant Date: 20100414
Implantable Lead Implant Date: 20140711
Implantable Lead Location: 753859
Implantable Lead Location: 753860
Implantable Lead Model: 181
Implantable Lead Model: 5076
Implantable Lead Serial Number: 323897
Implantable Pulse Generator Implant Date: 20140711
Lead Channel Impedance Value: 266 Ohm
Lead Channel Impedance Value: 304 Ohm
Lead Channel Impedance Value: 513 Ohm
Lead Channel Pacing Threshold Amplitude: 1.375 V
Lead Channel Pacing Threshold Amplitude: 1.875 V
Lead Channel Pacing Threshold Pulse Width: 0.4 ms
Lead Channel Pacing Threshold Pulse Width: 0.4 ms
Lead Channel Sensing Intrinsic Amplitude: 1.375 mV
Lead Channel Sensing Intrinsic Amplitude: 1.375 mV
Lead Channel Sensing Intrinsic Amplitude: 5.375 mV
Lead Channel Sensing Intrinsic Amplitude: 5.375 mV
Lead Channel Setting Pacing Amplitude: 2.75 V
Lead Channel Setting Pacing Amplitude: 3 V
Lead Channel Setting Pacing Pulse Width: 0.4 ms
Lead Channel Setting Sensing Sensitivity: 0.3 mV

## 2020-02-02 NOTE — Progress Notes (Signed)
Remote ICD transmission.   

## 2020-02-25 ENCOUNTER — Telehealth: Payer: Self-pay | Admitting: Cardiology

## 2020-02-25 NOTE — Telephone Encounter (Signed)
I attempted to contact patient 02/25/20 to schedule follow up visit from patients recall list. The patient didn't answer, left message for patient to return call to get appt scheduled.  

## 2020-04-27 NOTE — Progress Notes (Signed)
Cardiology Office Note:    Date:  04/28/2020   ID:  [name], DOB 12/13/1955, MRN [id number]  PCP:  Oval Linsey, MD  Cardiologist:  Khamil Lamica [place], MD  Electrophysiologist:  None   Referring MD: Oval Linsey, MD   Chief Complaint  Patient presents with   Coronary Artery Disease    History of Present Illness:    [name] is a 64 y.o. male with a hx of CAD s/p anterior STEMI 2000 s/p DES to LAD and CABG x 3 in 2012 , h/o cardiac arrest with v fib (repeat cath showed 50% ISR treated with PTCA and ICD implantation, hypertension, hyperlipidemia, and history of ischemic cardiomyopathy.  Initial ICD complicated by staph bacteremia and lead fracture and was eventually replaced by Dr. Johney Frame in 2014.  EF was initially 35% however later improved to 40-45% on echocardiogram in June 2018.  Myoview obtained in July 2018 showed no ischemia.  He had recurrent STEMI in February 2019.  Last cardiac catheterization was performed in February 2019 that showed severe three-vessel CAD, patent LIMA to LAD, patent SVG to OM 2, patent SVG to PDA, moderate to severe LV dysfunction with EF of 35%.  Negative proximal left circumflex had a 75% lesion and OM1 had 80% lesion, both were treated with separate drug-eluting stents.  Echocardiogram obtained on the following day showed EF down to 25-30%, akinesis of the anteroseptal and apical myocardium, grade 2 DD.  He is intolerant of Entresto due to rash.  He had hyperkalemia on spironolactone.   Most recent device interrogation obtained on 02/01/20 showed normal device function.    On follow up today he is doing very well. Denies any chest pain, SOB, edema, palpitations, dizziness or tachycardia. 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    Past Medical History:  Diagnosis Date   Adrenal mass (HCC)    currently being evaluated -- 4.1 cm right adrenal mass   Anemia    CAD (coronary artery disease)    2000-STEMI, DES to LAD, 1wk later VF arrest  d/t 50% ISR, 2012- CABG X3, 10/16/17 STEMI with VT-, LHC patnet graft, DES to 1st OM & Circ   Cardiac arrest - ventricular fibrillation 09/02/2008   a. s/p MDT dual chamber ICD   History of methicillin resistant staphylococcus aureus (MRSA)    History of methicillin-sensitive Staphylococcus aureus bacteremia  with implantable cardioverter-defibrillator explant, February 23, 2009   HL (hearing loss)    Hyperlipidemia    Hypertension    Ischemic cardiomyopathy    severe ischemic cardiomyopathy -- ejection fraction 30-35%   Pericarditis    history of acute pericarditis in July 2010 now resolved   S/P CABG (coronary artery bypass graft) Sept 2012    Past Surgical History:  Procedure Laterality Date   ANKLE SURGERY     CARDIAC CATHETERIZATION  08/26/2008   Est. EF of 45% -- Successful intracoronary stenting of the mid-LAD -- Three-vessel atherosclerotic coronary artery disease.  The patient has occlusion of the mid LAD, which is his culprit lesion.  He has  moderate disease in the proximal circumflex and PDA -- Moderate left ventricular dysfunction -- [name], [name].D   CORONARY ARTERY BYPASS GRAFT  05/07/2011   CABG x3:  LIMA to LAD, SVG to OM2, SVG to PDA   CORONARY STENT INTERVENTION N/A 10/15/2017   Procedure: CORONARY STENT INTERVENTION;  Surgeon: [name], Eulene Pekar M, MD;  Location: [facility] INVASIVE CV LAB;  Service: Cardiovascular;  Laterality: N/A;   ICD implant  02/23/2009    with subsequent extraction for MSSA bacteremia   ICD reimplantation  05/09/2009   status post implant of a Medtronic Maximo II DR dual- chamber cardioverter defibrillator by Dr. Hillis Range   LEAD REVISION Left 03/05/2013   RV lead fracture.  A new [employer] [phone] lead was placed   LEFT HEART CATH AND CORS/GRAFTS ANGIOGRAPHY N/A 10/15/2017   Procedure: LEFT HEART CATH AND CORS/GRAFTS ANGIOGRAPHY;  Surgeon: [name], Billy Lince M, MD;  Location: [facility] INVASIVE CV LAB;  Service: Cardiovascular;  Laterality: N/A;     Current Medications: Current Meds  Medication Sig   aspirin EC 81 MG tablet Take 1 tablet (81 mg total) by mouth daily.   atorvastatin (LIPITOR) 80 MG tablet Take 1 tablet (80 mg total) by mouth at bedtime.   carvedilol (COREG) 25 MG tablet TAKE 1 TABLET BY MOUTH TWICE DAILY WITH A MEAL   nitroGLYCERIN (NITROSTAT) 0.4 MG SL tablet Place 1 tablet (0.4 mg total) under the tongue every 5 (five) minutes x 3 doses as needed for chest pain. IF NEEDED   ramipril (ALTACE) 10 MG capsule Take 1 capsule (10 mg total) by mouth 2 (two) times daily.   spironolactone (ALDACTONE) 25 MG tablet Take 1 tablet (25 mg total) by mouth daily.     Allergies:   Sacubitril-valsartan and Morphine and related   Social History   Socioeconomic History   Marital status: Married    Spouse name: Not on file   Number of children: 3   Years of education: Not on file   Highest education level: Not on file  Occupational History   Occupation: Curator farm equipment  Tobacco Use   Smoking status: Former Smoker    Years: 37.00    Types: Cigarettes    Quit date: 10/14/2017    Years since quitting: 2.5   Smokeless tobacco: Never Used   Tobacco comment: 1 pack per week. Has been smoking for 35 years.   Vaping Use   Vaping Use: Never used  Substance and Sexual Activity   Alcohol use: No   Drug use: No   Sexual activity: Not on file  Other Topics Concern   Not on file  Social History Narrative   SOCIAL HISTORY:  He is married for 37 years with children.  He smokes 1 pack per day. He has been smoking for 37 years.  He denies alcohol use.  He works as a  Curator.         FAMILY HISTORY:  Father has a history of coronary artery disease. Mother died in her 31s with a stroke.  He has 2 sisters who are alive and well.    Social Determinants of Health   Financial Resource Strain:    Difficulty of Paying Living Expenses: Not on file  Food Insecurity:    Worried About Programme researcher, broadcasting/film/video in  the Last Year: Not on file   [employer] of Food in the Last Year: Not on file  Transportation Needs:    Lack of Transportation (Medical): Not on file   Lack of Transportation (Non-Medical): Not on file  Physical Activity:    Days of Exercise per Week: Not on file   Minutes of Exercise per Session: Not on file  Stress:    Feeling of Stress : Not on file  Social Connections:    Frequency of Communication with Friends and Family: Not on file   Frequency of Social Gatherings with Friends and Family: Not on file  Attends Religious Services: Not on file   Active Member of Clubs or Organizations: Not on file   Attends BankerClub or Organization Meetings: Not on file   Marital Status: Not on file     Family History: The patient's family history includes Coronary artery disease in his father; Stroke in his mother.  ROS:   Please see the history of present illness.     All other systems reviewed and are negative.  EKGs/Labs/Other Studies Reviewed:    The following studies were reviewed today:  Cath 10/15/2017  Mid RCA lesion is 100% stenosed.  Prox Cx lesion is 75% stenosed.  A drug-eluting stent was successfully placed using a STENT SYNERGY DES 3X12.  Post intervention, there is a 0% residual stenosis.  Ost Cx to Prox Cx lesion is 45% stenosed.  Ost 1st Mrg lesion is 90% stenosed.  1st Mrg lesion is 80% stenosed.  A drug-eluting stent was successfully placed using a STENT SYNERGY DES 2.25X24.  Post intervention, there is a 0% residual stenosis.  Prox LAD to Mid LAD lesion is 70% stenosed.  Dist LAD lesion is 90% stenosed.  LIMA graft was visualized by angiography and is normal in caliber.  The graft exhibits no disease.  SVG graft was visualized by angiography and is normal in caliber.  The graft exhibits no disease.  SVG graft was visualized by angiography and is normal in caliber.  The graft exhibits no disease.  There is moderate to severe left  ventricular systolic dysfunction.  LV end diastolic pressure is normal.   1. Severe 3 vessel obstructive CAD 2. Patent LIMA to the LAD 3. Patent SVG to the second OM 4. Patent SVG to the PDA. 5. Moderate to severe LV dysfunction. EF estimated at 35% 6. Normal LVEDP 7. Successful stenting of the first OM and proximal LCx with DES x 2  Plan: the first OM was felt to be the culprit lesion. It appeared hazy. There is severe stenosis in the distal LAD but this LV distribution appears akinetic/scarred. Will continue DAPT for one year. Risk factor modification including smoking cessation.    Echo 10/16/2017 LV EF: 25% -   30%  Study Conclusions  - Left ventricle: The cavity size was normal. Wall thickness was   increased in a pattern of mild LVH. Systolic function was   severely reduced. The estimated ejection fraction was in the   range of 25% to 30%. There is akinesis of the anteroseptal and   apical myocardium. Features are consistent with a pseudonormal   left ventricular filling pattern, with concomitant abnormal   relaxation and increased filling pressure (grade 2 diastolic   dysfunction). - Aortic valve: There was trivial regurgitation. - Mitral valve: Calcified annulus. - Left atrium: The atrium was mildly dilated.  Impressions:  - Akinesis of the distal anteroseptal and apical walls with overall   severely reduced LV systolic function; moderate diastolic   dysfunction; trace AI; mild LAE.   EKG:  EKG is ordered today.  The ekg ordered today demonstrates atrial paced rhythm. Old anterior infarct. I have personally reviewed and interpreted this study.   Recent Labs: 10/01/2019: BUN 11; Creatinine, Ser 0.90; NT-Pro BNP 627; Potassium 4.6; Sodium 140  Recent Lipid Panel    Component Value Date/Time   CHOL 120 02/07/2017 0839   TRIG 100 02/07/2017 0839   HDL 27 (L) 02/07/2017 0839   CHOLHDL 4.4 02/07/2017 0839   CHOLHDL 4.7 03/11/2016 0818   VLDL 16 03/11/2016 0818  LDLCALC 73 02/07/2017 0839   LDLDIRECT 217.3 04/04/2011 1124    Physical Exam:    VS:  BP 120/76    Pulse 62    Ht 5\' 7"  (1.702 [name])    Wt 159 lb 2 oz (72.2 kg)    BMI 24.92 kg/[name]     Wt Readings from Last 3 Encounters:  04/28/20 159 lb 2 oz (72.2 kg)  10/01/19 170 lb 6.4 oz (77.3 kg)  09/16/19 166 lb 3.2 oz (75.4 kg)     GEN:  Well nourished, well developed in no acute distress HEENT: Normal NECK: No JVD; No carotid bruits LYMPHATICS: No lymphadenopathy CARDIAC: RRR, no murmurs, rubs, gallops RESPIRATORY:  Clear to auscultation without rales, wheezing or rhonchi  ABDOMEN: Soft, non-tender, non-distended MUSCULOSKELETAL:  No edema; No deformity  SKIN: Warm and dry NEUROLOGIC:  Alert and oriented x 3 PSYCHIATRIC:  Normal affect   ASSESSMENT:    1. Chronic systolic CHF (congestive heart failure) (HCC)   2. Cardiomyopathy, ischemic   3. Coronary artery disease involving coronary bypass graft of native heart without angina pectoris   4. Essential hypertension   5. Hypercholesterolemia    PLAN:    In order of problems listed above:  1. CAD s/p CABG: Denies any  anginal symptoms.  He is still on lifelong aspirin and Lipitor.  2. Ischemic cardiomyopathy with chronic systolic CHF. Well compensated. Class 1-2. Intolerant of Entresto. On Coreg, aldactone, Altace. if he were to develop more symptoms would consider adding a SGLT 2 inhibitor. status post Medtronic ICD: Followed by EP service.  Most recent device interrogation in June 2021 demonstrated normal function  3. Hypertension: Blood pressure is well controlled.  4. Hyperlipidemia: Continue Lipitor.  Lipids followed by Dr July 2021.    Medication Adjustments/Labs and Tests Ordered: Current medicines are reviewed at length with the patient today.  Concerns regarding medicines are outlined above.  No orders of the defined types were placed in this encounter.  No orders of the defined types were placed in this  encounter.   There are no Patient Instructions on file for this visit.   Signed, Davell Beckstead Janna Arch, MD  04/28/2020 4:03 PM    [facility]

## 2020-04-28 ENCOUNTER — Encounter: Payer: Self-pay | Admitting: Cardiology

## 2020-04-28 ENCOUNTER — Ambulatory Visit: Payer: Medicare Other | Admitting: Cardiology

## 2020-04-28 ENCOUNTER — Other Ambulatory Visit: Payer: Self-pay

## 2020-04-28 VITALS — BP 120/76 | HR 62 | Ht 67.0 in | Wt 159.1 lb

## 2020-04-28 DIAGNOSIS — I5022 Chronic systolic (congestive) heart failure: Secondary | ICD-10-CM | POA: Diagnosis not present

## 2020-04-28 DIAGNOSIS — I10 Essential (primary) hypertension: Secondary | ICD-10-CM

## 2020-04-28 DIAGNOSIS — I255 Ischemic cardiomyopathy: Secondary | ICD-10-CM | POA: Diagnosis not present

## 2020-04-28 DIAGNOSIS — E7800 Pure hypercholesterolemia, unspecified: Secondary | ICD-10-CM

## 2020-04-28 DIAGNOSIS — I25810 Atherosclerosis of coronary artery bypass graft(s) without angina pectoris: Secondary | ICD-10-CM

## 2020-05-02 ENCOUNTER — Ambulatory Visit (INDEPENDENT_AMBULATORY_CARE_PROVIDER_SITE_OTHER): Payer: Medicare Other | Admitting: *Deleted

## 2020-05-02 DIAGNOSIS — I4729 Other ventricular tachycardia: Secondary | ICD-10-CM

## 2020-05-02 DIAGNOSIS — I472 Ventricular tachycardia: Secondary | ICD-10-CM | POA: Diagnosis not present

## 2020-05-02 DIAGNOSIS — I5022 Chronic systolic (congestive) heart failure: Secondary | ICD-10-CM

## 2020-05-02 LAB — CUP PACEART REMOTE DEVICE CHECK
Battery Remaining Longevity: 23 mo
Battery Voltage: 2.91 V
Brady Statistic AP VP Percent: 0 %
Brady Statistic AP VS Percent: 98.23 %
Brady Statistic AS VP Percent: 0 %
Brady Statistic AS VS Percent: 1.77 %
Brady Statistic RA Percent Paced: 98.17 %
Brady Statistic RV Percent Paced: 0 %
Date Time Interrogation Session: 20210907043627
HighPow Impedance: 63 Ohm
Implantable Lead Implant Date: 20100414
Implantable Lead Implant Date: 20140711
Implantable Lead Location: 753859
Implantable Lead Location: 753860
Implantable Lead Model: 181
Implantable Lead Model: 5076
Implantable Lead Serial Number: 323897
Implantable Pulse Generator Implant Date: 20140711
Lead Channel Impedance Value: 304 Ohm
Lead Channel Impedance Value: 323 Ohm
Lead Channel Impedance Value: 532 Ohm
Lead Channel Pacing Threshold Amplitude: 1.25 V
Lead Channel Pacing Threshold Amplitude: 1.875 V
Lead Channel Pacing Threshold Pulse Width: 0.4 ms
Lead Channel Pacing Threshold Pulse Width: 0.4 ms
Lead Channel Sensing Intrinsic Amplitude: 1.625 mV
Lead Channel Sensing Intrinsic Amplitude: 1.625 mV
Lead Channel Sensing Intrinsic Amplitude: 5.5 mV
Lead Channel Sensing Intrinsic Amplitude: 5.5 mV
Lead Channel Setting Pacing Amplitude: 3 V
Lead Channel Setting Pacing Amplitude: 3 V
Lead Channel Setting Pacing Pulse Width: 0.4 ms
Lead Channel Setting Sensing Sensitivity: 0.3 mV

## 2020-05-04 NOTE — Progress Notes (Signed)
Remote ICD transmission.   

## 2020-08-01 ENCOUNTER — Ambulatory Visit (INDEPENDENT_AMBULATORY_CARE_PROVIDER_SITE_OTHER): Payer: Medicare Other

## 2020-08-01 DIAGNOSIS — I4729 Other ventricular tachycardia: Secondary | ICD-10-CM

## 2020-08-01 DIAGNOSIS — I472 Ventricular tachycardia: Secondary | ICD-10-CM | POA: Diagnosis not present

## 2020-08-01 LAB — CUP PACEART REMOTE DEVICE CHECK
Battery Remaining Longevity: 21 mo
Battery Voltage: 2.89 V
Brady Statistic AP VP Percent: 0 %
Brady Statistic AP VS Percent: 97.15 %
Brady Statistic AS VP Percent: 0 %
Brady Statistic AS VS Percent: 2.85 %
Brady Statistic RA Percent Paced: 97.12 %
Brady Statistic RV Percent Paced: 0 %
Date Time Interrogation Session: 20211207043827
HighPow Impedance: 58 Ohm
Implantable Lead Implant Date: 20100414
Implantable Lead Implant Date: 20140711
Implantable Lead Location: 753859
Implantable Lead Location: 753860
Implantable Lead Model: 181
Implantable Lead Model: 5076
Implantable Lead Serial Number: 323897
Implantable Pulse Generator Implant Date: 20140711
Lead Channel Impedance Value: 304 Ohm
Lead Channel Impedance Value: 304 Ohm
Lead Channel Impedance Value: 513 Ohm
Lead Channel Pacing Threshold Amplitude: 1.5 V
Lead Channel Pacing Threshold Amplitude: 1.875 V
Lead Channel Pacing Threshold Pulse Width: 0.4 ms
Lead Channel Pacing Threshold Pulse Width: 0.4 ms
Lead Channel Sensing Intrinsic Amplitude: 1.25 mV
Lead Channel Sensing Intrinsic Amplitude: 1.25 mV
Lead Channel Sensing Intrinsic Amplitude: 4.875 mV
Lead Channel Sensing Intrinsic Amplitude: 4.875 mV
Lead Channel Setting Pacing Amplitude: 3 V
Lead Channel Setting Pacing Amplitude: 3 V
Lead Channel Setting Pacing Pulse Width: 0.4 ms
Lead Channel Setting Sensing Sensitivity: 0.3 mV

## 2020-08-14 NOTE — Progress Notes (Signed)
Remote ICD transmission.   

## 2020-09-08 ENCOUNTER — Telehealth: Payer: Self-pay | Admitting: *Deleted

## 2020-09-08 NOTE — Telephone Encounter (Signed)
ERROR, ACTUALLY IS A DEFIBRILLATOR SO WILL TAKE CLEARANCE DOWN TO DEVICE.

## 2020-09-13 NOTE — Telephone Encounter (Signed)
Patient is calling to follow up regarding his clearance. He states a call back right now will not be necessary, however, he would like an update during/after his appointment, scheduled for 09/15/20 with [employer]

## 2020-09-14 NOTE — Progress Notes (Signed)
Electrophysiology Office Note Date: 09/14/2020  ID:  [name], DOB [birth year], MRN [id number]  PCP: Oval Linsey, MD Primary Cardiologist: [place] Electrophysiologist: Allred  CC: Routine ICD follow-up  [name] is a 65 y.o. male seen today for Dr Johney Frame.  He presents today for routine electrophysiology followup.  Since last being seen in our clinic, the patient reports doing very well. 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 He denies chest pain, palpitations, dyspnea, PND, orthopnea, nausea, vomiting, dizziness, syncope, edema, weight gain, or early satiety.  He has not had ICD shocks.   Device History: MDT dual chamberICD implanted2033for VF arrest (extraction and re-implantation same year for MSSA bacteremia); gen change 2014 with new RV lead for RV lead fracture History of appropriate therapy:No History of AAD therapy:No   Past Medical History:  Diagnosis Date  . Adrenal mass (HCC)    currently being evaluated -- 4.1 cm right adrenal mass  . Anemia   . CAD (coronary artery disease)    2000-STEMI, DES to LAD, 1wk later VF arrest d/t 50% ISR, 2012- CABG X3, 10/16/17 STEMI with VT-, LHC patnet graft, DES to 1st OM & Circ  . Cardiac arrest - ventricular fibrillation 09/02/2008   a. s/p MDT dual chamber ICD  . History of methicillin resistant staphylococcus aureus (MRSA)    History of methicillin-sensitive Staphylococcus aureus bacteremia  with implantable cardioverter-defibrillator explant, February 23, 2009  . HL (hearing loss)   . Hyperlipidemia   . Hypertension   . Ischemic cardiomyopathy    severe ischemic cardiomyopathy -- ejection fraction 30-35%  . Pericarditis    history of acute pericarditis in July 2010 now resolved  . S/P CABG (coronary artery bypass graft) Sept 2012   Past Surgical History:  Procedure Laterality Date  . ANKLE SURGERY    . CARDIAC CATHETERIZATION  08/26/2008   Est. EF of 45% --  Successful intracoronary stenting of the mid-LAD -- Three-vessel atherosclerotic coronary artery disease.  The patient has occlusion of the mid LAD, which is his culprit lesion.  He has  moderate disease in the proximal circumflex and PDA -- Moderate left ventricular dysfunction -- [name], [name]  . CORONARY ARTERY BYPASS GRAFT  05/07/2011   CABG x3:  LIMA to LAD, SVG to OM2, SVG to PDA  . CORONARY STENT INTERVENTION N/A 10/15/2017   Procedure: CORONARY STENT INTERVENTION;  Surgeon: Swaziland, Peter M, MD;  Location: [facility] INVASIVE CV LAB;  Service: Cardiovascular;  Laterality: N/A;  . ICD implant  02/23/2009    with subsequent extraction for MSSA bacteremia  . ICD reimplantation  05/09/2009   status post implant of a Medtronic Maximo II DR dual- chamber cardioverter defibrillator by Dr. Hillis Range  . LEAD REVISION Left 03/05/2013   RV lead fracture.  A new [employer] [phone] lead was placed  . LEFT HEART CATH AND CORS/GRAFTS ANGIOGRAPHY N/A 10/15/2017   Procedure: LEFT HEART CATH AND CORS/GRAFTS ANGIOGRAPHY;  Surgeon: Swaziland, Peter M, MD;  Location: [facility] INVASIVE CV LAB;  Service: Cardiovascular;  Laterality: N/A;    Current Outpatient Medications  Medication Sig Dispense Refill  . aspirin EC 81 MG tablet Take 1 tablet (81 mg total) by mouth daily. 90 tablet 3  . atorvastatin (LIPITOR) 80 MG tablet Take 1 tablet (80 mg total) by mouth at bedtime. 90 tablet 3  . carvedilol (COREG) 25 MG tablet TAKE 1 TABLET BY MOUTH TWICE DAILY WITH A MEAL 180 tablet 1  .  nitroGLYCERIN (NITROSTAT) 0.4 MG SL tablet Place 1 tablet (0.4 mg total) under the tongue every 5 (five) minutes x 3 doses as needed for chest pain. IF NEEDED 25 tablet 6  . ramipril (ALTACE) 10 MG capsule Take 1 capsule (10 mg total) by mouth 2 (two) times daily. 180 capsule 3  . spironolactone (ALDACTONE) 25 MG tablet Take 1 tablet (25 mg total) by mouth daily. 90 tablet 3   No current facility-administered medications for this visit.     Allergies:   Sacubitril-valsartan and Morphine and related   Social History: Social History   Socioeconomic History  . Marital status: Married    Spouse name: Not on file  . Number of children: 3  . Years of education: Not on file  . Highest education level: Not on file  Occupational History  . Occupation: Multimedia programmer  Tobacco Use  . Smoking status: Former Smoker    Years: 37.00    Types: Cigarettes    Quit date: 10/14/2017    Years since quitting: 2.9  . Smokeless tobacco: Never Used  . Tobacco comment: 1 pack per week. Has been smoking for 35 years.   Vaping Use  . Vaping Use: Never used  Substance and Sexual Activity  . Alcohol use: No  . Drug use: No  . Sexual activity: Not on file  Other Topics Concern  . Not on file  Social History Narrative   SOCIAL HISTORY:  He is married for 37 years with children.  He smokes 1 pack per day. He has been smoking for 37 years.  He denies alcohol use.  He works as a  Curator.         FAMILY HISTORY:  Father has a history of coronary artery disease. Mother died in her 59s with a stroke.  He has 2 sisters who are alive and well.    Social Determinants of Health   Financial Resource Strain: Not on file  Food Insecurity: Not on file  Transportation Needs: Not on file  Physical Activity: Not on file  Stress: Not on file  Social Connections: Not on file  Intimate Partner Violence: Not on file    Family History: Family History  Problem Relation Age of Onset  . Stroke Mother   . Coronary artery disease Father     Review of Systems: All other systems reviewed and are otherwise negative except as noted above.   Physical Exam: VS:  There were no vitals taken for this visit. , BMI There is no height or weight on file to calculate BMI.  GEN- The patient is well appearing, alert and oriented x 3 today.   HEENT: normocephalic, atraumatic; sclera clear, conjunctiva pink; hearing intact; oropharynx clear; neck  supple Lungs- Clear to ausculation bilaterally, normal work of breathing.  No wheezes, rales, rhonchi Heart- Regular rate and rhythm (paced) GI- soft, non-tender, non-distended, bowel sounds present  Extremities- no clubbing, cyanosis, or edema MS- no significant deformity or atrophy Skin- warm and dry, no rash or lesion; ICD pocket well healed Psych- euthymic mood, full affect Neuro- strength and sensation are intact  ICD interrogation- reviewed in detail today,  See PACEART report  EKG:  EKG is not ordered today.  Recent Labs: 10/01/2019: BUN 11; Creatinine, Ser 0.90; NT-Pro BNP 627; Potassium 4.6; Sodium 140   Wt Readings from Last 3 Encounters:  04/28/20 159 lb 2 oz (72.2 kg)  10/01/19 170 lb 6.4 oz (77.3 kg)  09/16/19 166 lb 3.2 oz (75.4  kg)     Other studies Reviewed: Additional studies/ records that were reviewed today include: Dr Elvis Coil office notes   Assessment and Plan:  1.  Chronic systolic dysfunction euvolemic today Stable on an appropriate medical regimen Normal ICD function See Pace Art report No changes today Continue follow up in ICM clinic Functional status too good for Barostim  2.  CAD s/p CABG Stable No change required today  3.  VT Prior VF arrest No recent recurrence   4.  Surgical clearance He is pending cataract surgery. He is at moderate cardiac risk for any procedure, but functional status is very good and there is no cardiac workup needed prior to surgery that would alter risks. No need for ICD intervention during cataract surgery.   Current medicines are reviewed at length with the patient today.   The patient does not have concerns regarding his medicines.  The following changes were made today:  none  Labs/ tests ordered today include: none No orders of the defined types were placed in this encounter.    Disposition:   Follow up with [facility], me in 1 year    Signed, Gypsy Balsam, NP 09/14/2020 7:34 AM  [facility] [address] [place] [address] [phone] (office) [phone] (fax)

## 2020-09-15 ENCOUNTER — Other Ambulatory Visit: Payer: Self-pay

## 2020-09-15 ENCOUNTER — Ambulatory Visit: Payer: Medicare Other | Admitting: Nurse Practitioner

## 2020-09-15 ENCOUNTER — Encounter: Payer: Self-pay | Admitting: Nurse Practitioner

## 2020-09-15 VITALS — BP 112/62 | HR 61 | Ht 67.0 in | Wt 162.0 lb

## 2020-09-15 DIAGNOSIS — I5022 Chronic systolic (congestive) heart failure: Secondary | ICD-10-CM | POA: Diagnosis not present

## 2020-09-15 DIAGNOSIS — I25810 Atherosclerosis of coronary artery bypass graft(s) without angina pectoris: Secondary | ICD-10-CM | POA: Diagnosis not present

## 2020-09-15 DIAGNOSIS — I4729 Other ventricular tachycardia: Secondary | ICD-10-CM

## 2020-09-15 DIAGNOSIS — I472 Ventricular tachycardia: Secondary | ICD-10-CM

## 2020-09-15 LAB — CUP PACEART INCLINIC DEVICE CHECK
Date Time Interrogation Session: 20220121082037
Implantable Lead Implant Date: 20100414
Implantable Lead Implant Date: 20140711
Implantable Lead Location: 753859
Implantable Lead Location: 753860
Implantable Lead Model: 181
Implantable Lead Model: 5076
Implantable Lead Serial Number: 323897
Implantable Pulse Generator Implant Date: 20140711

## 2020-09-15 NOTE — Patient Instructions (Signed)
Medication Instructions:  Your physician recommends that you continue on your current medications as directed. Please refer to the Current Medication list given to you today.  *If you need a refill on your cardiac medications before your next appointment, please call your pharmacy*  Lab Work: None ordered. If you have labs (blood work) drawn today and your tests are completely normal, you will receive your results only by: Marland Kitchen MyChart Message (if you have MyChart) OR . A paper copy in the mail If you have any lab test that is abnormal or we need to change your treatment, we will call you to review the results.  Testing/Procedures: None ordered.  Follow-Up: At [facility], you and your health needs are our priority.  As part of our continuing mission to provide you with exceptional heart care, we have created designated Provider Care Teams.  These Care Teams include your primary Cardiologist (physician) and Advanced Practice Providers (APPs -  Physician Assistants and Nurse Practitioners) who all work together to provide you with the care you need, when you need it.  Your next appointment:   12 month(s)  The format for your next appointment:   In Person  Provider:   You may see Dr. Johney Frame or one of the following Advanced Practice Providers on your designated Care Team:    Gypsy Balsam, NP  Francis Dowse, PA-C  Casim[facility] Needle "[place]" [place], [place]

## 2020-09-18 ENCOUNTER — Telehealth: Payer: Self-pay | Admitting: Internal Medicine

## 2020-09-18 NOTE — Telephone Encounter (Signed)
° °  [facility] Pre-operative Risk Assessment    HEARTCARE STAFF: - Please ensure there is not already an duplicate clearance open for this procedure. - Under Visit Info/Reason for Call, type in Other and utilize the format Clearance MM/DD/YY or Clearance TBD. Do not use dashes or single digits. - If request is for dental extraction, please clarify the # of teeth to be extracted.  Request for surgical clearance:  1. What type of surgery is being performed? Eye surgical  2. When is this surgery scheduled? Today 09/18/2020  3. What type of clearance is required (medical clearance vs. Pharmacy clearance to hold med vs. Both) Medical. Are there any medications that need to be held prior to surgery and how long? none 4. Practice name and name of physician performing surgery? Dr. Manuella Ghazi, [facility]   5. What is the office phone number? [phone]   7.   What is the office fax number? [phone]  8.   Anesthesia type (None, local, MAC, general) ? None local MAC    [name] 09/18/2020, 8:06 AM  _________________________________________________________________   (provider comments below)

## 2020-09-18 NOTE — Telephone Encounter (Signed)
   Primary Cardiologist: Peter Swaziland, MD  Chart reviewed as part of pre-operative protocol coverage. Given past medical history and time since last visit, based on ACC/AHA guidelines, BRONSON BRESSMAN would be at acceptable risk for the planned procedure without further cardiovascular testing.   I will route this recommendation to the requesting party via Epic fax function and remove from pre-op pool.  Please call with questions.  Thomasene Ripple. Nashika Coker NP-C    09/18/2020, 8:52 AM [facility] [address] Office [phone] Fax [phone]

## 2020-09-18 NOTE — Telephone Encounter (Signed)
Faxed via epic fax function to requesting providers office

## 2020-09-29 DIAGNOSIS — H2512 Age-related nuclear cataract, left eye: Secondary | ICD-10-CM | POA: Diagnosis not present

## 2020-10-17 DIAGNOSIS — H59091 Other disorders of the right eye following cataract surgery: Secondary | ICD-10-CM | POA: Diagnosis not present

## 2020-10-23 NOTE — Progress Notes (Unsigned)
Cardiology Office Note:    Date:  10/26/2020   ID:  Billy JacquetRobert E [name], DOB January 31, 1956, MRN [id number]  PCP:  Oval Linseyondiego, Richard, MD  Cardiologist:  Peter SwazilandJordan, MD  Electrophysiologist:  None   Referring MD: Oval Linseyondiego, Richard, MD   Chief Complaint  Patient presents with  . Coronary Artery Disease  . Congestive Heart Failure    History of Present Illness:    Billy JacquetRobert E [name] is a 65 y.o. male with a hx of CAD s/p anterior STEMI 2010 s/p DES to LAD and CABG x 3 in 2012 , h/o cardiac arrest with v fib (repeat cath showed 50% ISR treated with PTCA and ICD implantation, hypertension, hyperlipidemia, and history of ischemic cardiomyopathy.  Initial ICD complicated by staph bacteremia and lead fracture and was eventually replaced by Dr. Johney FrameAllred in 2014.  EF was initially 35% however later improved to 40-45% on echocardiogram in June 2018.  Myoview obtained in July 2018 showed no ischemia.  He had recurrent STEMI in February 2019.  Last cardiac catheterization was performed in February 2019 that showed severe three-vessel CAD, patent LIMA to LAD, patent SVG to OM 2, patent SVG to PDA, moderate to severe LV dysfunction with EF of 35%.  Negative proximal left circumflex had a 75% lesion and OM1 had 80% lesion, both were treated with separate drug-eluting stents.  Echocardiogram obtained on the following day showed EF down to 25-30%, akinesis of the anteroseptal and apical myocardium, grade 2 DD.  He is intolerant of Entresto due to rash.    Most recent device interrogation obtained in Jan 2022 showed normal device function.    On follow up today he is doing very well. Denies any chest pain, SOB, edema, palpitations, dizziness or tachycardia. Stays active. Still doing Curatormechanic work.    Past Medical History:  Diagnosis Date  . Adrenal mass (HCC)    currently being evaluated -- 4.1 cm right adrenal mass  . Anemia   . CAD (coronary artery disease)    2000-STEMI, DES to LAD, 1wk later VF arrest d/t 50%  ISR, 2012- CABG X3, 10/16/17 STEMI with VT-, LHC patnet graft, DES to 1st OM & Circ  . Cardiac arrest - ventricular fibrillation 09/02/2008   a. s/p MDT dual chamber ICD  . History of methicillin resistant staphylococcus aureus (MRSA)    History of methicillin-sensitive Staphylococcus aureus bacteremia  with implantable cardioverter-defibrillator explant, February 23, 2009  . HL (hearing loss)   . Hyperlipidemia   . Hypertension   . Ischemic cardiomyopathy    severe ischemic cardiomyopathy -- ejection fraction 30-35%  . Pericarditis    history of acute pericarditis in July 2010 now resolved  . S/P CABG (coronary artery bypass graft) Sept 2012    Past Surgical History:  Procedure Laterality Date  . ANKLE SURGERY    . CARDIAC CATHETERIZATION  08/26/2008   Est. EF of 45% -- Successful intracoronary stenting of the mid-LAD -- Three-vessel atherosclerotic coronary artery disease.  The patient has occlusion of the mid LAD, which is his culprit lesion.  He has  moderate disease in the proximal circumflex and PDA -- Moderate left ventricular dysfunction -- Peter M. SwazilandJordan, [name]  . CORONARY ARTERY BYPASS GRAFT  05/07/2011   CABG x3:  LIMA to LAD, SVG to OM2, SVG to PDA  . CORONARY STENT INTERVENTION N/A 10/15/2017   Procedure: CORONARY STENT INTERVENTION;  Surgeon: SwazilandJordan, Peter M, MD;  [facility];  Service: Cardiovascular;  Laterality: N/A;  . ICD  implant  02/23/2009    with subsequent extraction for MSSA bacteremia  . ICD reimplantation  05/09/2009   status post implant of a Medtronic Maximo II DR dual- chamber cardioverter defibrillator by Dr. Hillis Range  . LEAD REVISION Left 03/05/2013   RV lead fracture.  A new [employer] [id number] lead was placed  . LEFT HEART CATH AND CORS/GRAFTS ANGIOGRAPHY N/A 10/15/2017   Procedure: LEFT HEART CATH AND CORS/GRAFTS ANGIOGRAPHY;  Surgeon: Swaziland, Peter M, MD;  Location: [facility] INVASIVE CV LAB;  Service: Cardiovascular;  Laterality: N/A;     Current Medications: Current Meds  Medication Sig  . aspirin EC 81 MG tablet Take 1 tablet (81 mg total) by mouth daily.  Marland Kitchen atorvastatin (LIPITOR) 80 MG tablet Take 1 tablet (80 mg total) by mouth at bedtime.  . carvedilol (COREG) 25 MG tablet TAKE 1 TABLET BY MOUTH TWICE DAILY WITH A MEAL  . nitroGLYCERIN (NITROSTAT) 0.4 MG SL tablet Place 1 tablet (0.4 mg total) under the tongue every 5 (five) minutes x 3 doses as needed for chest pain. IF NEEDED  . ramipril (ALTACE) 10 MG capsule Take 1 capsule (10 mg total) by mouth 2 (two) times daily.  Marland Kitchen spironolactone (ALDACTONE) 25 MG tablet Take 1 tablet (25 mg total) by mouth daily.     Allergies:   Sacubitril-valsartan and Morphine and related   Social History   Socioeconomic History  . Marital status: Married    Spouse name: Not on file  . Number of children: 3  . Years of education: Not on file  . Highest education level: Not on file  Occupational History  . Occupation: Multimedia programmer  Tobacco Use  . Smoking status: Former Smoker    Years: 37.00    Types: Cigarettes    Quit date: 10/14/2017    Years since quitting: 3.0  . Smokeless tobacco: Never Used  . Tobacco comment: 1 pack per week. Has been smoking for 35 years.   Vaping Use  . Vaping Use: Never used  Substance and Sexual Activity  . Alcohol use: No  . Drug use: No  . Sexual activity: Not on file  Other Topics Concern  . Not on file  Social History Narrative   SOCIAL HISTORY:  He is married for 37 years with children.  He smokes 1 pack per day. He has been smoking for 37 years.  He denies alcohol use.  He works as a  Curator.         FAMILY HISTORY:  Father has a history of coronary artery disease. Mother died in her 44s with a stroke.  He has 2 sisters who are alive and well.    Social Determinants of Health   Financial Resource Strain: Not on file  Food Insecurity: Not on file  Transportation Needs: Not on file  Physical Activity: Not on file   Stress: Not on file  Social Connections: Not on file     Family History: The patient's family history includes Coronary artery disease in his father; Stroke in his mother.  ROS:   Please see the history of present illness.     All other systems reviewed and are negative.  EKGs/Labs/Other Studies Reviewed:    The following studies were reviewed today:  Cath 10/15/2017  Mid RCA lesion is 100% stenosed.  Prox Cx lesion is 75% stenosed.  A drug-eluting stent was successfully placed using a STENT SYNERGY DES 3X12.  Post intervention, there is a 0% residual stenosis.  Billy Jacquet  Cx to Prox Cx lesion is 45% stenosed.  Ost 1st Mrg lesion is 90% stenosed.  1st Mrg lesion is 80% stenosed.  A drug-eluting stent was successfully placed using a STENT SYNERGY DES 2.25X24.  Post intervention, there is a 0% residual stenosis.  Prox LAD to Mid LAD lesion is 70% stenosed.  Dist LAD lesion is 90% stenosed.  LIMA graft was visualized by angiography and is normal in caliber.  The graft exhibits no disease.  SVG graft was visualized by angiography and is normal in caliber.  The graft exhibits no disease.  SVG graft was visualized by angiography and is normal in caliber.  The graft exhibits no disease.  There is moderate to severe left ventricular systolic dysfunction.  LV end diastolic pressure is normal.   1. Severe 3 vessel obstructive CAD 2. Patent LIMA to the LAD 3. Patent SVG to the second OM 4. Patent SVG to the PDA. 5. Moderate to severe LV dysfunction. EF estimated at 35% 6. Normal LVEDP 7. Successful stenting of the first OM and proximal LCx with DES x 2  Plan: the first OM was felt to be the culprit lesion. It appeared hazy. There is severe stenosis in the distal LAD but this LV distribution appears akinetic/scarred. Will continue DAPT for one year. Risk factor modification including smoking cessation.    Echo 10/16/2017 LV EF: 25% -   30%  Study Conclusions  -  Left ventricle: The cavity size was normal. Wall thickness was   increased in a pattern of mild LVH. Systolic function was   severely reduced. The estimated ejection fraction was in the   range of 25% to 30%. There is akinesis of the anteroseptal and   apical myocardium. Features are consistent with a pseudonormal   left ventricular filling pattern, with concomitant abnormal   relaxation and increased filling pressure (grade 2 diastolic   dysfunction). - Aortic valve: There was trivial regurgitation. - Mitral valve: Calcified annulus. - Left atrium: The atrium was mildly dilated.  Impressions:  - Akinesis of the distal anteroseptal and apical walls with overall   severely reduced LV systolic function; moderate diastolic   dysfunction; trace AI; mild LAE.   EKG:  EKG is  Not ordered today.     Recent Labs: No results found for requested labs within last 8760 hours.  Recent Lipid Panel    Component Value Date/Time   CHOL 120 02/07/2017 0839   TRIG 100 02/07/2017 0839   HDL 27 (L) 02/07/2017 0839   CHOLHDL 4.4 02/07/2017 0839   CHOLHDL 4.7 03/11/2016 0818   VLDL 16 03/11/2016 0818   LDLCALC 73 02/07/2017 0839   LDLDIRECT 217.3 04/04/2011 1124    Physical Exam:    VS:  BP 125/73   Pulse 64   Ht 5\' 7"  (1.702 m)   Wt 156 lb 6.4 oz (70.9 kg)   SpO2 93%   BMI 24.50 kg/m     Wt Readings from Last 3 Encounters:  10/26/20 156 lb 6.4 oz (70.9 kg)  09/15/20 162 lb (73.5 kg)  04/28/20 159 lb 2 oz (72.2 kg)     GEN:  Well nourished, well developed in no acute distress HEENT: Normal NECK: No JVD; No carotid bruits Chest: ICD in right upper chest CARDIAC: RRR, no murmurs, rubs, gallops RESPIRATORY:  Clear to auscultation without rales, wheezing or rhonchi  ABDOMEN: Soft, non-tender, non-distended MUSCULOSKELETAL:  No edema; No deformity  SKIN: Warm and dry NEUROLOGIC:  Alert and oriented x 3  PSYCHIATRIC:  Normal affect   ASSESSMENT:    1. Coronary artery disease of  bypass graft of native heart with stable angina pectoris (HCC)   2. Chronic systolic CHF (congestive heart failure) (HCC)   3. Automatic implantable cardioverter-defibrillator in situ   4. VENTRICULAR TACHYCARDIA   5. HYPERCHOLESTEROLEMIA   6. Primary hypertension    PLAN:    In order of problems listed above:  1. CAD s/p CABG: s/p PCI of the native LCX/OM1. Denies any  anginal symptoms.  He is still on lifelong aspirin and Lipitor.  2. Ischemic cardiomyopathy with chronic systolic CHF. Well compensated. Class 1-2. Intolerant of Entresto. On Coreg, aldactone, Altace. if he were to develop more symptoms would consider adding a SGLT 2 inhibitor. status post Medtronic ICD: Followed by EP service.  Most recent device interrogation in Jan 2022 demonstrated normal function.   3. Hypertension: Blood pressure is well controlled.  4. Hyperlipidemia: Continue Lipitor.  Will; update labs today with CMET and lipid panel.     Signed, Peter Swaziland, MD  10/26/2020 8:12 AM    [facility]

## 2020-10-26 ENCOUNTER — Encounter: Payer: Self-pay | Admitting: Cardiology

## 2020-10-26 ENCOUNTER — Ambulatory Visit: Payer: Medicare Other | Admitting: Cardiology

## 2020-10-26 ENCOUNTER — Other Ambulatory Visit: Payer: Self-pay

## 2020-10-26 VITALS — BP 125/73 | HR 64 | Ht 67.0 in | Wt 156.4 lb

## 2020-10-26 DIAGNOSIS — I472 Ventricular tachycardia: Secondary | ICD-10-CM | POA: Diagnosis not present

## 2020-10-26 DIAGNOSIS — I25708 Atherosclerosis of coronary artery bypass graft(s), unspecified, with other forms of angina pectoris: Secondary | ICD-10-CM

## 2020-10-26 DIAGNOSIS — Z95810 Presence of automatic (implantable) cardiac defibrillator: Secondary | ICD-10-CM

## 2020-10-26 DIAGNOSIS — I10 Essential (primary) hypertension: Secondary | ICD-10-CM | POA: Diagnosis not present

## 2020-10-26 DIAGNOSIS — I5022 Chronic systolic (congestive) heart failure: Secondary | ICD-10-CM | POA: Diagnosis not present

## 2020-10-26 DIAGNOSIS — E7800 Pure hypercholesterolemia, unspecified: Secondary | ICD-10-CM | POA: Diagnosis not present

## 2020-10-26 DIAGNOSIS — I4729 Other ventricular tachycardia: Secondary | ICD-10-CM

## 2020-10-26 LAB — HEPATIC FUNCTION PANEL
ALT: 13 IU/L (ref 0–44)
AST: 20 IU/L (ref 0–40)
Albumin: 4.4 g/dL (ref 3.8–4.8)
Alkaline Phosphatase: 147 IU/L — ABNORMAL HIGH (ref 44–121)
Bilirubin Total: 0.3 mg/dL (ref 0.0–1.2)
Bilirubin, Direct: 0.11 mg/dL (ref 0.00–0.40)
Total Protein: 7.1 g/dL (ref 6.0–8.5)

## 2020-10-26 LAB — LIPID PANEL
Chol/HDL Ratio: 3 ratio (ref 0.0–5.0)
Cholesterol, Total: 118 mg/dL (ref 100–199)
HDL: 39 mg/dL — ABNORMAL LOW (ref >39)
LDL Chol Calc (NIH): 68 mg/dL (ref 0–99)
Triglycerides: 44 mg/dL (ref 0–149)
VLDL Cholesterol Cal: 11 mg/dL (ref 5–40)

## 2020-10-26 LAB — BASIC METABOLIC PANEL
BUN/Creatinine Ratio: 21 (ref 10–24)
BUN: 21 mg/dL (ref 8–27)
CO2: 20 mmol/L (ref 20–29)
Calcium: 9.4 mg/dL (ref 8.6–10.2)
Chloride: 100 mmol/L (ref 96–106)
Creatinine, Ser: 0.99 mg/dL (ref 0.76–1.27)
Glucose: 91 mg/dL (ref 65–99)
Potassium: 5 mmol/L (ref 3.5–5.2)
Sodium: 135 mmol/L (ref 134–144)
eGFR: 86 mL/min/{1.73_m2} (ref >59)

## 2020-10-31 ENCOUNTER — Ambulatory Visit (INDEPENDENT_AMBULATORY_CARE_PROVIDER_SITE_OTHER): Payer: Medicare Other

## 2020-10-31 DIAGNOSIS — I5022 Chronic systolic (congestive) heart failure: Secondary | ICD-10-CM | POA: Diagnosis not present

## 2020-10-31 LAB — CUP PACEART REMOTE DEVICE CHECK
Battery Remaining Longevity: 17 mo
Battery Voltage: 2.88 V
Brady Statistic AP VP Percent: 0.01 %
Brady Statistic AP VS Percent: 96.32 %
Brady Statistic AS VP Percent: 0 %
Brady Statistic AS VS Percent: 3.67 %
Brady Statistic RA Percent Paced: 96.26 %
Brady Statistic RV Percent Paced: 0.01 %
Date Time Interrogation Session: 20220308033530
HighPow Impedance: 71 Ohm
Implantable Lead Implant Date: 20100414
Implantable Lead Implant Date: 20140711
Implantable Lead Location: 753859
Implantable Lead Location: 753860
Implantable Lead Model: 181
Implantable Lead Model: 5076
Implantable Lead Serial Number: 323897
Implantable Pulse Generator Implant Date: 20140711
Lead Channel Impedance Value: 304 Ohm
Lead Channel Impedance Value: 304 Ohm
Lead Channel Impedance Value: 513 Ohm
Lead Channel Pacing Threshold Amplitude: 1.375 V
Lead Channel Pacing Threshold Amplitude: 1.875 V
Lead Channel Pacing Threshold Pulse Width: 0.4 ms
Lead Channel Pacing Threshold Pulse Width: 0.4 ms
Lead Channel Sensing Intrinsic Amplitude: 2 mV
Lead Channel Sensing Intrinsic Amplitude: 2 mV
Lead Channel Sensing Intrinsic Amplitude: 5.625 mV
Lead Channel Sensing Intrinsic Amplitude: 5.625 mV
Lead Channel Setting Pacing Amplitude: 2.75 V
Lead Channel Setting Pacing Amplitude: 3 V
Lead Channel Setting Pacing Pulse Width: 0.4 ms
Lead Channel Setting Sensing Sensitivity: 0.3 mV

## 2020-11-08 NOTE — Progress Notes (Signed)
Remote ICD transmission.   

## 2020-12-02 ENCOUNTER — Other Ambulatory Visit: Payer: Self-pay | Admitting: Nurse Practitioner

## 2021-01-28 DIAGNOSIS — L03012 Cellulitis of left finger: Secondary | ICD-10-CM | POA: Diagnosis not present

## 2021-01-28 DIAGNOSIS — Z23 Encounter for immunization: Secondary | ICD-10-CM | POA: Diagnosis not present

## 2021-01-28 DIAGNOSIS — Z95810 Presence of automatic (implantable) cardiac defibrillator: Secondary | ICD-10-CM | POA: Diagnosis not present

## 2021-01-28 DIAGNOSIS — I2510 Atherosclerotic heart disease of native coronary artery without angina pectoris: Secondary | ICD-10-CM | POA: Diagnosis not present

## 2021-01-28 DIAGNOSIS — F17210 Nicotine dependence, cigarettes, uncomplicated: Secondary | ICD-10-CM | POA: Diagnosis not present

## 2021-01-28 DIAGNOSIS — I252 Old myocardial infarction: Secondary | ICD-10-CM | POA: Diagnosis not present

## 2021-01-28 DIAGNOSIS — M7989 Other specified soft tissue disorders: Secondary | ICD-10-CM | POA: Diagnosis not present

## 2021-01-28 DIAGNOSIS — M19042 Primary osteoarthritis, left hand: Secondary | ICD-10-CM | POA: Diagnosis not present

## 2021-02-07 ENCOUNTER — Emergency Department (HOSPITAL_COMMUNITY)
Admission: EM | Admit: 2021-02-07 | Discharge: 2021-02-07 | Disposition: A | Discharge status: Home / Self Care | Payer: Medicare Other | Attending: Emergency Medicine | Admitting: Emergency Medicine

## 2021-02-07 ENCOUNTER — Telehealth: Payer: Self-pay | Admitting: *Deleted

## 2021-02-07 ENCOUNTER — Other Ambulatory Visit: Payer: Self-pay

## 2021-02-07 ENCOUNTER — Encounter (HOSPITAL_COMMUNITY): Payer: Self-pay

## 2021-02-07 ENCOUNTER — Emergency Department (HOSPITAL_COMMUNITY): Payer: Medicare Other

## 2021-02-07 DIAGNOSIS — Z4502 Encounter for adjustment and management of automatic implantable cardiac defibrillator: Secondary | ICD-10-CM | POA: Diagnosis not present

## 2021-02-07 DIAGNOSIS — I110 Hypertensive heart disease with heart failure: Secondary | ICD-10-CM | POA: Insufficient documentation

## 2021-02-07 DIAGNOSIS — Z951 Presence of aortocoronary bypass graft: Secondary | ICD-10-CM | POA: Diagnosis not present

## 2021-02-07 DIAGNOSIS — F17210 Nicotine dependence, cigarettes, uncomplicated: Secondary | ICD-10-CM | POA: Diagnosis not present

## 2021-02-07 DIAGNOSIS — R079 Chest pain, unspecified: Secondary | ICD-10-CM | POA: Diagnosis not present

## 2021-02-07 DIAGNOSIS — R0789 Other chest pain: Secondary | ICD-10-CM | POA: Insufficient documentation

## 2021-02-07 DIAGNOSIS — I2510 Atherosclerotic heart disease of native coronary artery without angina pectoris: Secondary | ICD-10-CM | POA: Insufficient documentation

## 2021-02-07 DIAGNOSIS — R0602 Shortness of breath: Secondary | ICD-10-CM | POA: Diagnosis not present

## 2021-02-07 DIAGNOSIS — Z79899 Other long term (current) drug therapy: Secondary | ICD-10-CM | POA: Insufficient documentation

## 2021-02-07 DIAGNOSIS — I5022 Chronic systolic (congestive) heart failure: Secondary | ICD-10-CM | POA: Insufficient documentation

## 2021-02-07 DIAGNOSIS — Z7982 Long term (current) use of aspirin: Secondary | ICD-10-CM | POA: Diagnosis not present

## 2021-02-07 LAB — HEPATIC FUNCTION PANEL
ALT: 16 U/L (ref 0–44)
AST: 19 U/L (ref 15–41)
Albumin: 3.5 g/dL (ref 3.5–5.0)
Alkaline Phosphatase: 96 U/L (ref 38–126)
Bilirubin, Direct: 0.1 mg/dL (ref 0.0–0.2)
Indirect Bilirubin: 0.3 mg/dL (ref 0.3–0.9)
Total Bilirubin: 0.4 mg/dL (ref 0.3–1.2)
Total Protein: 6.5 g/dL (ref 6.5–8.1)

## 2021-02-07 LAB — BASIC METABOLIC PANEL
Anion gap: 7 (ref 5–15)
BUN: 20 mg/dL (ref 8–23)
CO2: 24 mmol/L (ref 22–32)
Calcium: 8.8 mg/dL — ABNORMAL LOW (ref 8.9–10.3)
Chloride: 104 mmol/L (ref 98–111)
Creatinine, Ser: 0.89 mg/dL (ref 0.61–1.24)
GFR, Estimated: >60 mL/min (ref >60)
Glucose, Bld: 113 mg/dL — ABNORMAL HIGH (ref 70–99)
Potassium: 4.2 mmol/L (ref 3.5–5.1)
Sodium: 135 mmol/L (ref 135–145)

## 2021-02-07 LAB — MAGNESIUM: Magnesium: 1.5 mg/dL — ABNORMAL LOW (ref 1.7–2.4)

## 2021-02-07 LAB — CBC
HCT: 40.3 % (ref 39.0–52.0)
Hemoglobin: 13.4 g/dL (ref 13.0–17.0)
MCH: 33.6 pg (ref 26.0–34.0)
MCHC: 33.3 g/dL (ref 30.0–36.0)
MCV: 101 fL — ABNORMAL HIGH (ref 80.0–100.0)
Platelets: 246 10*3/uL (ref 150–400)
RBC: 3.99 MIL/uL — ABNORMAL LOW (ref 4.22–5.81)
RDW: 12.9 % (ref 11.5–15.5)
WBC: 7.4 10*3/uL (ref 4.0–10.5)
nRBC: 0 % (ref 0.0–0.2)

## 2021-02-07 LAB — TROPONIN I (HIGH SENSITIVITY): Troponin I (High Sensitivity): 5 ng/L (ref <18)

## 2021-02-07 MED ORDER — MAGNESIUM SULFATE 2 GM/50ML IV SOLN
2.0000 g | Freq: Once | INTRAVENOUS | Status: AC
  Administered 2021-02-07: 2 g via INTRAVENOUS
  Filled 2021-02-07: qty 50

## 2021-02-07 NOTE — ED Triage Notes (Signed)
Pt presents to ED with complaints of chest tightness and SOB after bending over this morning after his shower. Pt states his defibrillator started making a beeping sound at that time also.

## 2021-02-07 NOTE — Telephone Encounter (Signed)
error 

## 2021-02-07 NOTE — ED Provider Notes (Signed)
[facility] EMERGENCY DEPARTMENT Provider Note   CSN: [id number] Arrival date & time: 02/07/21  0825     History Chief Complaint  Patient presents with   Chest Pain    [name] is a 65 y.o. male.  Pt presents to the ED today with chest tightness and sob.  Pt said he bent over this morning after his shower and felt dizzy. He has a hx of ischemic cardiomyopathy and has an AICD in place.  He said his defibrillator made a beeping noise.  He did not feel it shock him.  He said he stood back up and the beeping stopped.  He still has some tightness in his chest, but no "pain."       Past Medical History:  Diagnosis Date   Adrenal mass (HCC)    currently being evaluated -- 4.1 cm right adrenal mass   Anemia    CAD (coronary artery disease)    2000-STEMI, DES to LAD, 1wk later VF arrest d/t 50% ISR, 2012- CABG X3, 10/16/17 STEMI with VT-, LHC patnet graft, DES to 1st OM & Circ   Cardiac arrest - ventricular fibrillation 09/02/2008   a. s/p MDT dual chamber ICD   History of methicillin resistant staphylococcus aureus (MRSA)    History of methicillin-sensitive Staphylococcus aureus bacteremia  with implantable cardioverter-defibrillator explant, February 23, 2009   HL (hearing loss)    Hyperlipidemia    Hypertension    Ischemic cardiomyopathy    severe ischemic cardiomyopathy -- ejection fraction 30-35%   Pericarditis    history of acute pericarditis in July 2010 now resolved   S/P CABG (coronary artery bypass graft) Sept 2012    Patient Active Problem List   Diagnosis Date Noted   Drug-induced skin rash 10/24/2017   NSTEMI (non-ST elevated myocardial infarction) (HCC) 10/15/2017   Acute myocardial infarction, subendocardial infarction, initial episode of care (HCC) 03/04/2013   Implantable cardioverter-defibrillator (ICD) in situ 06/24/2012   S/P CABG x 3 05/31/2011   Adrenal mass (HCC)    METHICILLIN SUSCEPTIBLE STAPH AUREUS SEPTICEMIA 04/27/2009   HYPERCHOLESTEROLEMIA  12/14/2008   HYPOMAGNESEMIA 12/14/2008   HYPOKALEMIA 12/14/2008   TOBACCO ABUSE 12/14/2008   ENCEPHALOPATHY, ANOXIC 12/14/2008   HYPERTENSION 12/14/2008   CAD (coronary artery disease) 12/14/2008   VENTRICULAR TACHYCARDIA 12/14/2008   Cardiac arrest (HCC) 12/14/2008   CHF 12/14/2008   Chronic systolic CHF (congestive heart failure) (HCC) 12/14/2008   UNSPECIFIED CARDIOVASCULAR DISEASE 12/14/2008    Past Surgical History:  Procedure Laterality Date   ANKLE SURGERY     CARDIAC CATHETERIZATION  08/26/2008   Est. EF of 45% -- Successful intracoronary stenting of the mid-LAD -- Three-vessel atherosclerotic coronary artery disease.  The patient has occlusion of the mid LAD, which is his culprit lesion.  He has  moderate disease in the proximal circumflex and PDA -- Moderate left ventricular dysfunction -- [name], [name]   CORONARY ARTERY BYPASS GRAFT  05/07/2011   CABG x3:  LIMA to LAD, SVG to OM2, SVG to PDA   CORONARY STENT INTERVENTION N/A 10/15/2017   Procedure: CORONARY STENT INTERVENTION;  Surgeon: Swaziland, Peter M, MD;  Location: [facility] INVASIVE CV LAB;  Service: Cardiovascular;  Laterality: N/A;   ICD implant  02/23/2009    with subsequent extraction for MSSA bacteremia   ICD reimplantation  05/09/2009   status post implant of a Medtronic Maximo II DR dual- chamber cardioverter defibrillator by Dr. Hillis Range   LEAD REVISION Left 03/05/2013   RV lead  fracture.  A new [employer] [id number] lead was placed   LEFT HEART CATH AND CORS/GRAFTS ANGIOGRAPHY N/A 10/15/2017   Procedure: LEFT HEART CATH AND CORS/GRAFTS ANGIOGRAPHY;  Surgeon: SwazilandJordan, Peter M, MD;  [facility];  Service: Cardiovascular;  Laterality: N/A;       Family History  Problem Relation Age of Onset   Stroke Mother    Coronary artery disease Father     Social History   Tobacco Use   Smoking status: Every Day    Years: 37.00    Pack years: 0.00    Types: Cigarettes    Last attempt to quit:  10/14/2017    Years since quitting: 3.3   Smokeless tobacco: Never   Tobacco comments:    1 pack per week. Has been smoking for 35 years.   Vaping Use   Vaping Use: Never used  Substance Use Topics   Alcohol use: No   Drug use: Yes    Types: Marijuana    Home Medications Prior to Admission medications   Medication Sig Start Date End Date Taking? Authorizing Provider  aspirin EC 81 MG tablet Take 1 tablet (81 mg total) by mouth daily. 02/07/17  Yes SwazilandJordan, Peter M, MD  atorvastatin (LIPITOR) 80 MG tablet Take 1 tablet (80 mg total) by mouth at bedtime. 11/26/19  Yes SwazilandJordan, Peter M, MD  carvedilol (COREG) 25 MG tablet TAKE 1 TABLET BY MOUTH TWICE DAILY WITH A MEAL Patient taking differently: Take 25 mg by mouth 2 (two) times daily with a meal. 01/28/20  Yes SwazilandJordan, Peter M, MD  doxycycline (VIBRAMYCIN) 100 MG capsule Take 100 mg by mouth in the morning and at bedtime. 01/28/21 02/07/21 Yes [provider]  ramipril (ALTACE) 10 MG capsule TAKE (1) CAPSULE BY MOUTH TWICE DAILY. Patient taking differently: Take 10 mg by mouth 2 (two) times daily. 12/05/20  Yes SwazilandJordan, Peter M, MD  spironolactone (ALDACTONE) 25 MG tablet Take 1 tablet (25 mg total) by mouth daily. 11/26/19  Yes SwazilandJordan, Peter M, MD  nitroGLYCERIN (NITROSTAT) 0.4 MG SL tablet Place 1 tablet (0.4 mg total) under the tongue every 5 (five) minutes x 3 doses as needed for chest pain. IF NEEDED 12/08/13   SwazilandJordan, Peter M, MD    Allergies    Sacubitril-valsartan and Morphine and related  Review of Systems   Review of Systems  Cardiovascular:  Positive for chest pain.  All other systems reviewed and are negative.  Physical Exam Updated Vital Signs BP 101/65   Pulse 65   Temp 97.8 F (36.6 C) (Oral)   Resp 18   Ht 5\' 7"  (1.702 m)   Wt 72.6 kg   SpO2 96%   BMI 25.06 kg/m   Physical Exam Vitals and nursing note reviewed.  Constitutional:      Appearance: He is well-developed.  HENT:     Head: Normocephalic and  atraumatic.  Eyes:     Extraocular Movements: Extraocular movements intact.     Pupils: Pupils are equal, round, and reactive to light.  Cardiovascular:     Rate and Rhythm: Normal rate and regular rhythm.     Heart sounds: Normal heart sounds.  Pulmonary:     Effort: Pulmonary effort is normal.     Breath sounds: Normal breath sounds.  Abdominal:     General: Bowel sounds are normal.     Palpations: Abdomen is soft.  Musculoskeletal:        General: Normal range of motion.  Cervical back: Normal range of motion and neck supple.  Skin:    General: Skin is warm.     Capillary Refill: Capillary refill takes less than 2 seconds.  Neurological:     General: No focal deficit present.     Mental Status: He is alert and oriented to person, place, and time.  Psychiatric:        Mood and Affect: Mood normal.        Behavior: Behavior normal.    ED Results / Procedures / Treatments   Labs (all labs ordered are listed, but only abnormal results are displayed) Labs Reviewed  BASIC METABOLIC PANEL - Abnormal; Notable for the following components:      Result Value   Glucose, Bld 113 (*)    Calcium 8.8 (*)    All other components within normal limits  CBC - Abnormal; Notable for the following components:   RBC 3.99 (*)    MCV 101.0 (*)    All other components within normal limits  MAGNESIUM - Abnormal; Notable for the following components:   Magnesium 1.5 (*)    All other components within normal limits  HEPATIC FUNCTION PANEL  TROPONIN I (HIGH SENSITIVITY)    EKG EKG Interpretation  Date/Time:  Wednesday February 07 2021 08:31:11 EDT Ventricular Rate:  70 PR Interval:  171 QRS Duration: 118 QT Interval:  405 QTC Calculation: 437 R Axis:   -81 Text Interpretation: ATRIAL PACED RHYTHM Confirmed by Jacalyn Lefevre [phone]) on 02/07/2021 8:41:11 AM  Radiology DG Chest Port 1 View  Result Date: 02/07/2021 CLINICAL DATA:  Chest tightness and shortness of breath. EXAM: PORTABLE  CHEST 1 VIEW COMPARISON:  October 14, 2017 FINDINGS: Cardiac pacemaker. Possible lead fracture of one of the proximal leads 3 cm superior to the battery. Postsurgical changes from CABG. Enlarged cardiac silhouette. Mediastinal contours appear intact. Calcific atherosclerotic disease and tortuosity of the aorta. There is no evidence of focal airspace consolidation, pleural effusion or pneumothorax. Osseous structures are without acute abnormality. Soft tissues are grossly normal. IMPRESSION: 1. Enlarged cardiac silhouette. 2. Possible lead fracture of one of the proximal leads 3 cm superior to the battery. Please correlate clinically. Electronically Signed   By: Ted Mcalpine [name].   On: 02/07/2021 09:08    Procedures Procedures   Medications Ordered in ED Medications  magnesium sulfate IVPB 2 g 50 mL (2 g Intravenous New Bag/Given 02/07/21 1572)    ED Course  I have reviewed the triage vital signs and the nursing notes.  Pertinent labs & imaging results that were available during my care of the patient were reviewed by me and considered in my medical decision making (see chart for details).    MDM Rules/Calculators/A&P                             Pt's Medtronic device interrogated.  He had an atrial bipolar lead impedance warning yesterday.  He been having atrial tachycardia for more than 6 hrs for 1 days.  Possible fluid overload ongoing.  Pt's Mg is low at 1.5, so he is given 2 g of magnesium IV.  Pt d/w Dr. Eden Emms (cards).  He will have Dr. Jenel Lucks office call pt for an appt to be seen sooner rather than later.  No adjustments in meds now.    Pt feels well.  He knows to return if that changes.  He knows to f/u with Dr. Johney Frame.  Return if  worse.   Final Clinical Impression(s) / ED Diagnoses Final diagnoses:  Encounter for assessment of automatic implantable cardioverter-defibrillator (AICD)  Hypomagnesemia    Rx / DC Orders ED Discharge Orders     None         Jacalyn Lefevre, MD 02/07/21 [phone]

## 2021-02-07 NOTE — ED Notes (Signed)
Medtronic called and states pacemaker trigger atrial lead warning yesterday, episodes of atrial arrythmias, fluid index level increased, non sustained VT in January. EDP made aware.

## 2021-02-08 ENCOUNTER — Telehealth: Payer: Self-pay | Admitting: Internal Medicine

## 2021-02-08 NOTE — Telephone Encounter (Signed)
  1. Has your device fired? yes  2. Is you device beeping? yes  3. Are you experiencing draining or swelling at device site? no  4. Are you calling to see if we received your device transmission? no  5. Have you passed out? no   Patient is not having any cardiac symptoms so he is not sure why his monitor keeps beeping. He just wants to talk to Dr. Jenel Lucks Nurse    Please route to Device Clinic Pool

## 2021-02-08 NOTE — Telephone Encounter (Signed)
Contacted patient's spouse and advised that beeping from the monitor was comming from the hight impedence warning. Consulted with [employer] and representative stated that  the alert could occur every 5 hours. Patient has an appointment at 2 pm on 02/09/21 with Dr. Johney Frame in office to interrogate the device and disable the alert. Patient aware of appointment tomorrow with Dr. Johney Frame.

## 2021-02-09 ENCOUNTER — Ambulatory Visit: Payer: Medicare Other | Admitting: Internal Medicine

## 2021-02-09 ENCOUNTER — Other Ambulatory Visit: Payer: Self-pay

## 2021-02-09 ENCOUNTER — Encounter: Payer: Self-pay | Admitting: Internal Medicine

## 2021-02-09 ENCOUNTER — Telehealth: Payer: Self-pay

## 2021-02-09 VITALS — BP 90/56 | HR 69 | Ht 67.0 in | Wt 154.0 lb

## 2021-02-09 DIAGNOSIS — I5022 Chronic systolic (congestive) heart failure: Secondary | ICD-10-CM | POA: Diagnosis not present

## 2021-02-09 DIAGNOSIS — I25708 Atherosclerosis of coronary artery bypass graft(s), unspecified, with other forms of angina pectoris: Secondary | ICD-10-CM | POA: Diagnosis not present

## 2021-02-09 DIAGNOSIS — F17200 Nicotine dependence, unspecified, uncomplicated: Secondary | ICD-10-CM | POA: Diagnosis not present

## 2021-02-09 DIAGNOSIS — I4729 Other ventricular tachycardia: Secondary | ICD-10-CM

## 2021-02-09 DIAGNOSIS — I255 Ischemic cardiomyopathy: Secondary | ICD-10-CM | POA: Diagnosis not present

## 2021-02-09 DIAGNOSIS — I472 Ventricular tachycardia: Secondary | ICD-10-CM | POA: Diagnosis not present

## 2021-02-09 MED ORDER — CARVEDILOL 12.5 MG PO TABS: 12.5000 mg | ORAL_TABLET | Freq: Two times a day (BID) | ORAL | 3 refills | Status: DC

## 2021-02-09 NOTE — Telephone Encounter (Signed)
I ordered the patient a new wirex and he should receive it in two weeks.

## 2021-02-09 NOTE — Patient Instructions (Signed)
Medication Instructions:  Reduce Coreg to 12.5 mg two time a day Your physician recommends that you continue on your current medications as directed. Please refer to the Current Medication list given to you today.  Labwork: None ordered.  Testing/Procedures: None ordered.  Follow-Up: Your physician wants you to follow-up in: 04/11/21 at 2:24 pm with Dr. Johney Frame.  Remote monitoring is used to monitor your Pacemaker of ICD from home. This monitoring reduces the number of office visits required to check your device to one time per year. It allows [place] to keep an eye on the functioning of your device to ensure it is working properly. You are scheduled for a device check from home on 05/01/21. You may send your transmission at any time that day. If you have a wireless device, the transmission will be sent automatically. After your physician reviews your transmission, you will receive a postcard with your next transmission date.  Any Other Special Instructions Will Be Listed Below (If Applicable).  If you need a refill on your cardiac medications before your next appointment, please call your pharmacy.

## 2021-02-09 NOTE — Progress Notes (Signed)
PCP: Oval Linsey, MD Primary Cardiologist: [name] Primary EP: Dr Larena Sox is a 65 y.o. male who presents today for urgent electrophysiology followup.  Since last being seen in our clinic, the patient reports doing reasonably well.  His atrial lead has fractured.  Today, he denies symptoms of palpitations, chest pain, shortness of breath,  lower extremity edema, dizziness, presyncope, syncope, or ICD shocks.  The patient is otherwise without complaint today.   Past Medical History:  Diagnosis Date   Adrenal mass (HCC)    currently being evaluated -- 4.1 cm right adrenal mass   Anemia    CAD (coronary artery disease)    2000-STEMI, DES to LAD, 1wk later VF arrest d/t 50% ISR, 2012- CABG X3, 10/16/17 STEMI with VT-, LHC patnet graft, DES to 1st OM & Circ   Cardiac arrest - ventricular fibrillation 09/02/2008   a. s/p MDT dual chamber ICD   History of methicillin resistant staphylococcus aureus (MRSA)    History of methicillin-sensitive Staphylococcus aureus bacteremia  with implantable cardioverter-defibrillator explant, February 23, 2009   HL (hearing loss)    Hyperlipidemia    Hypertension    Ischemic cardiomyopathy    severe ischemic cardiomyopathy -- ejection fraction 30-35%   Pericarditis    history of acute pericarditis in July 2010 now resolved   S/P CABG (coronary artery bypass graft) Sept 2012   Past Surgical History:  Procedure Laterality Date   ANKLE SURGERY     CARDIAC CATHETERIZATION  08/26/2008   Est. EF of 45% -- Successful intracoronary stenting of the mid-LAD -- Three-vessel atherosclerotic coronary artery disease.  The patient has occlusion of the mid LAD, which is his culprit lesion.  He has  moderate disease in the proximal circumflex and PDA -- Moderate left ventricular dysfunction -- [name], [name]   CORONARY ARTERY BYPASS GRAFT  05/07/2011   CABG x3:  LIMA to LAD, SVG to OM2, SVG to PDA   CORONARY STENT INTERVENTION N/A 10/15/2017    Procedure: CORONARY STENT INTERVENTION;  Surgeon: Swaziland, Peter M, MD;  Location: [facility] INVASIVE CV LAB;  Service: Cardiovascular;  Laterality: N/A;   ICD implant  02/23/2009    with subsequent extraction for MSSA bacteremia   ICD reimplantation  05/09/2009   status post implant of a Medtronic Maximo II DR dual- chamber cardioverter defibrillator by Dr. Hillis Range   LEAD REVISION Left 03/05/2013   RV lead fracture.  A new [employer] [phone] lead was placed   LEFT HEART CATH AND CORS/GRAFTS ANGIOGRAPHY N/A 10/15/2017   Procedure: LEFT HEART CATH AND CORS/GRAFTS ANGIOGRAPHY;  Surgeon: Swaziland, Peter M, MD;  Location: [facility] INVASIVE CV LAB;  Service: Cardiovascular;  Laterality: N/A;    ROS- all systems are reviewed and negative except as per HPI above  Current Outpatient Medications  Medication Sig Dispense Refill   aspirin EC 81 MG tablet Take 1 tablet (81 mg total) by mouth daily. 90 tablet 3   atorvastatin (LIPITOR) 80 MG tablet Take 1 tablet (80 mg total) by mouth at bedtime. 90 tablet 3   carvedilol (COREG) 25 MG tablet TAKE 1 TABLET BY MOUTH TWICE DAILY WITH A MEAL 180 tablet 1   nitroGLYCERIN (NITROSTAT) 0.4 MG SL tablet Place 1 tablet (0.4 mg total) under the tongue every 5 (five) minutes x 3 doses as needed for chest pain. IF NEEDED 25 tablet 6   ramipril (ALTACE) 10 MG capsule TAKE (1) CAPSULE BY MOUTH TWICE DAILY. 180 capsule 0  spironolactone (ALDACTONE) 25 MG tablet Take 1 tablet (25 mg total) by mouth daily. 90 tablet 3   No current facility-administered medications for this visit.    Physical Exam: Vitals:   02/09/21 1422  BP: (!) 90/56  Pulse: 69  SpO2: 96%  Weight: 154 lb (69.9 kg)  Height: 5\' 7"  (1.702 m)    GEN- The patient is well appearing, alert and oriented x 3 today.   Head- normocephalic, atraumatic Eyes-  Sclera clear, conjunctiva pink Ears- hearing intact Oropharynx- clear Lungs- Clear to ausculation bilaterally, normal work of breathing Chest- ICD  pocket is well healed Heart- Regular rate and rhythm, no murmurs, rubs or gallops, PMI not laterally displaced GI- soft, NT, ND, + BS Extremities- no clubbing, cyanosis, or edema, L second finder is red and swollen..  he has been on antibiotics for this.  ICD interrogation- reviewed in detail today,  See PACEART report  ekg tracing ordered today is personally reviewed and shows sinus with atrial lead malfunction, + demand V pacing  Wt Readings from Last 3 Encounters:  02/09/21 154 lb (69.9 kg)  02/07/21 160 lb (72.6 kg)  10/26/20 156 lb 6.4 oz (70.9 kg)    Assessment and Plan:  1.  Chronic systolic dysfunction/ ischemic CM/ CAD/ prior VF arrest/ atrial lead fracture.  Presents today with atrial lead fracture.  He is s/p prior RV lead fracture requiring revision.  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    He would like to avoid ICD system revision.  This would likely require extraction of old leads.   I have therefore programmed VVI 40 bpm today and turned atrial alerts off.  Reduce coreg to 12.5mg  BID with hopes that his heart rates will improve on their own.  Importance of remote monitoring stressed.  His remote transmitter is not working.  MDT to arrange for a new transmitter.   he is not device dependant today      Risks, benefits and potential toxicities for medications prescribed and/or refilled reviewed with patient today.   Very complicated issue related to his device malfunction.  A high level of decision making was required for this encounter.  Return to see me in 2 months  Curator MD, [facility] 02/09/2021 2:33 PM

## 2021-02-12 ENCOUNTER — Other Ambulatory Visit: Payer: Self-pay | Admitting: Cardiology

## 2021-02-12 DIAGNOSIS — I255 Ischemic cardiomyopathy: Secondary | ICD-10-CM

## 2021-02-12 DIAGNOSIS — I5022 Chronic systolic (congestive) heart failure: Secondary | ICD-10-CM

## 2021-02-12 DIAGNOSIS — I4901 Ventricular fibrillation: Secondary | ICD-10-CM

## 2021-03-09 ENCOUNTER — Other Ambulatory Visit: Payer: Self-pay | Admitting: Cardiology

## 2021-03-16 ENCOUNTER — Other Ambulatory Visit: Payer: Self-pay | Admitting: Cardiology

## 2021-03-16 ENCOUNTER — Telehealth: Payer: Self-pay

## 2021-03-16 NOTE — Telephone Encounter (Signed)
Received a call from patients spouse stating they requested a refill on the patients Altace last week and the pharmacy has not received the refill yet. I advised her that the rx has been sent over.   I called the pharmacy and was told sometimes it takes a little while for them to receive the rx. I requested the contact the patients wife once the rx is ready for pick up. Pharmacist is agreeable.   Patients spouse made aware that they will be contacted once the rx is ready for pick up. Wife voiced understanding.

## 2021-04-11 ENCOUNTER — Encounter: Payer: Medicare Other | Admitting: Internal Medicine

## 2021-04-11 DIAGNOSIS — I255 Ischemic cardiomyopathy: Secondary | ICD-10-CM

## 2021-04-11 DIAGNOSIS — I4901 Ventricular fibrillation: Secondary | ICD-10-CM

## 2021-04-11 DIAGNOSIS — I5022 Chronic systolic (congestive) heart failure: Secondary | ICD-10-CM

## 2021-04-11 DIAGNOSIS — I25810 Atherosclerosis of coronary artery bypass graft(s) without angina pectoris: Secondary | ICD-10-CM

## 2021-04-25 ENCOUNTER — Ambulatory Visit (INDEPENDENT_AMBULATORY_CARE_PROVIDER_SITE_OTHER): Payer: Medicare Other | Admitting: Internal Medicine

## 2021-04-25 ENCOUNTER — Other Ambulatory Visit: Payer: Self-pay

## 2021-04-25 VITALS — BP 100/60 | HR 44 | Ht 67.0 in | Wt 153.6 lb

## 2021-04-25 DIAGNOSIS — I472 Ventricular tachycardia: Secondary | ICD-10-CM | POA: Diagnosis not present

## 2021-04-25 DIAGNOSIS — I25708 Atherosclerosis of coronary artery bypass graft(s), unspecified, with other forms of angina pectoris: Secondary | ICD-10-CM | POA: Diagnosis not present

## 2021-04-25 DIAGNOSIS — I5022 Chronic systolic (congestive) heart failure: Secondary | ICD-10-CM | POA: Diagnosis not present

## 2021-04-25 DIAGNOSIS — I255 Ischemic cardiomyopathy: Secondary | ICD-10-CM

## 2021-04-25 DIAGNOSIS — I4729 Other ventricular tachycardia: Secondary | ICD-10-CM

## 2021-04-25 NOTE — Progress Notes (Signed)
PCP: Oval Linsey, MD Primary Cardiologist: [name] Primary EP: Dr Larena Sox is a 65 y.o. male who presents today for routine electrophysiology followup.  Since last being seen in our clinic, the patient reports doing very well.  Today, he denies symptoms of palpitations, chest pain, shortness of breath,  lower extremity edema, dizziness, presyncope, syncope, or ICD shocks.  The patient is otherwise without complaint today.   Past Medical History:  Diagnosis Date   Adrenal mass (HCC)    currently being evaluated -- 4.1 cm right adrenal mass   Anemia    CAD (coronary artery disease)    2000-STEMI, DES to LAD, 1wk later VF arrest d/t 50% ISR, 2012- CABG X3, 10/16/17 STEMI with VT-, LHC patnet graft, DES to 1st OM & Circ   Cardiac arrest - ventricular fibrillation 09/02/2008   a. s/p MDT dual chamber ICD   History of methicillin resistant staphylococcus aureus (MRSA)    History of methicillin-sensitive Staphylococcus aureus bacteremia  with implantable cardioverter-defibrillator explant, February 23, 2009   HL (hearing loss)    Hyperlipidemia    Hypertension    Ischemic cardiomyopathy    severe ischemic cardiomyopathy -- ejection fraction 30-35%   Pericarditis    history of acute pericarditis in July 2010 now resolved   S/P CABG (coronary artery bypass graft) Sept 2012   Past Surgical History:  Procedure Laterality Date   ANKLE SURGERY     CARDIAC CATHETERIZATION  08/26/2008   Est. EF of 45% -- Successful intracoronary stenting of the mid-LAD -- Three-vessel atherosclerotic coronary artery disease.  The patient has occlusion of the mid LAD, which is his culprit lesion.  He has  moderate disease in the proximal circumflex and PDA -- Moderate left ventricular dysfunction -- [name], [name]   CORONARY ARTERY BYPASS GRAFT  05/07/2011   CABG x3:  LIMA to LAD, SVG to OM2, SVG to PDA   CORONARY STENT INTERVENTION N/A 10/15/2017   Procedure: CORONARY STENT INTERVENTION;   Surgeon: Swaziland, Peter M, MD;  Location: [facility] INVASIVE CV LAB;  Service: Cardiovascular;  Laterality: N/A;   ICD implant  02/23/2009    with subsequent extraction for MSSA bacteremia   ICD reimplantation  05/09/2009   status post implant of a Medtronic Maximo II DR dual- chamber cardioverter defibrillator by Dr. Hillis Range   LEAD REVISION Left 03/05/2013   RV lead fracture.  A new [employer] [phone] lead was placed   LEFT HEART CATH AND CORS/GRAFTS ANGIOGRAPHY N/A 10/15/2017   Procedure: LEFT HEART CATH AND CORS/GRAFTS ANGIOGRAPHY;  Surgeon: Swaziland, Peter M, MD;  Location: [facility] INVASIVE CV LAB;  Service: Cardiovascular;  Laterality: N/A;    ROS- all systems are reviewed and negative except as per HPI above  Current Outpatient Medications  Medication Sig Dispense Refill   aspirin EC 81 MG tablet Take 1 tablet (81 mg total) by mouth daily. 90 tablet 3   atorvastatin (LIPITOR) 80 MG tablet TAKE (1) TABLET BY MOUTH AT BEDTIME. 90 tablet 3   carvedilol (COREG) 12.5 MG tablet Take 1 tablet (12.5 mg total) by mouth 2 (two) times daily. 180 tablet 3   carvedilol (COREG) 25 MG tablet TAKE (1) TABLET BY MOUTH TWICE DAILY. 180 tablet 2   nitroGLYCERIN (NITROSTAT) 0.4 MG SL tablet Place 1 tablet (0.4 mg total) under the tongue every 5 (five) minutes x 3 doses as needed for chest pain. IF NEEDED 25 tablet 6   ramipril (ALTACE) 10 MG capsule  TAKE (1) CAPSULE BY MOUTH TWICE DAILY. 180 capsule 0   spironolactone (ALDACTONE) 25 MG tablet TAKE 1 TABLET BY MOUTH ONCE A DAY. 90 tablet 3   No current facility-administered medications for this visit.    Physical Exam: Vitals:   04/25/21 1531  BP: 100/60  Pulse: (!) 44  SpO2: 99%  Weight: 153 lb 9.6 oz (69.7 kg)  Height: 5\' 7"  (1.702 m)    GEN- The patient is well appearing, alert and oriented x 3 today.  He is covered in dirt and grease today.  Smells like hydraulic fluid from working on - normocephalic, atraumatic Eyes-   Sclera clear, conjunctiva pink Ears- hearing intact Oropharynx- clear Lungs- Clear to ausculation bilaterally, normal work of breathing Chest- ICD pocket is well healed Heart- Regular rate and rhythm, no murmurs, rubs or gallops, PMI not laterally displaced GI- soft, NT, ND, + BS Extremities- no clubbing, cyanosis, or edema  ICD interrogation- reviewed in detail today,  See PACEART report  ekg tracing ordered today is personally reviewed and shows sinus bradycardia  Wt Readings from Last 3 Encounters:  04/25/21 153 lb 9.6 oz (69.7 kg)  02/09/21 154 lb (69.9 kg)  02/07/21 160 lb (72.6 kg)    Assessment and Plan:  1.  Chronic systolic dysfunction/ CAD/ ischemic CM/ prior VR arrest/ atrial lead fracture euvolemic today Stable on an appropriate medical regimen On last visit, atrial lead was found to have fractured.  He is s/p prior RV lead fracture requiring revision previously.  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  Revision would likely require lead extraction.  For now, he would prefer to avoid this. We will continue to follow with VVI 40 bpm programming.  He V paces < 1% and histograms look good.  He states "I feel great". If V pacing increases or he begins to feel poorly, we could reduce coreg to 6.25mg  BID  Risks, benefits and potential toxicities for medications prescribed and/or refilled reviewed with patient today.   Return in 4 months to see EP APP  Curator MD, [facility] 04/25/2021 3:35 PM

## 2021-04-25 NOTE — Patient Instructions (Addendum)
Medication Instructions:  Your physician recommends that you continue on your current medications as directed. Please refer to the Current Medication list given to you today.  Labwork: None ordered.  Testing/Procedures: None ordered.  Follow-Up: Your physician wants you to follow-up in: 08/28/21 at 8 am with   Casimiro Needle "Mardelle Matte" Tillery, PA-C   You will receive a reminder letter in the mail two months in advance. If you don't receive a letter, please call our office to schedule the follow-up appointment.  Remote monitoring is used to monitor your ICD from home. This monitoring reduces the number of office visits required to check your device to one time per year. It allows [place] to keep an eye on the functioning of your device to ensure it is working properly. You are scheduled for a device check from home on 05/01/21. You may send your transmission at any time that day. If you have a wireless device, the transmission will be sent automatically. After your physician reviews your transmission, you will receive a postcard with your next transmission date.  Any Other Special Instructions Will Be Listed Below (If Applicable).  If you need a refill on your cardiac medications before your next appointment, please call your pharmacy.

## 2021-05-01 ENCOUNTER — Ambulatory Visit (INDEPENDENT_AMBULATORY_CARE_PROVIDER_SITE_OTHER): Payer: Medicare Other

## 2021-05-01 DIAGNOSIS — I255 Ischemic cardiomyopathy: Secondary | ICD-10-CM

## 2021-05-03 LAB — CUP PACEART REMOTE DEVICE CHECK
Battery Remaining Longevity: 10 mo
Battery Voltage: 2.85 V
Brady Statistic AP VP Percent: 0 %
Brady Statistic AP VS Percent: 0 %
Brady Statistic AS VP Percent: 0 %
Brady Statistic AS VS Percent: 100 %
Brady Statistic RA Percent Paced: 0 %
Brady Statistic RV Percent Paced: 0.51 %
Date Time Interrogation Session: 20220907044229
HighPow Impedance: 55 Ohm
Implantable Lead Implant Date: 20100414
Implantable Lead Implant Date: 20140711
Implantable Lead Location: 753859
Implantable Lead Location: 753860
Implantable Lead Model: 181
Implantable Lead Model: 5076
Implantable Lead Serial Number: 323897
Implantable Pulse Generator Implant Date: 20140711
Lead Channel Impedance Value: 304 Ohm
Lead Channel Impedance Value: 304 Ohm
Lead Channel Impedance Value: 399 Ohm
Lead Channel Pacing Threshold Amplitude: 1.625 V
Lead Channel Pacing Threshold Amplitude: 1.875 V
Lead Channel Pacing Threshold Pulse Width: 0.4 ms
Lead Channel Pacing Threshold Pulse Width: 0.4 ms
Lead Channel Sensing Intrinsic Amplitude: 0.375 mV
Lead Channel Sensing Intrinsic Amplitude: 0.375 mV
Lead Channel Sensing Intrinsic Amplitude: 4.75 mV
Lead Channel Sensing Intrinsic Amplitude: 4.75 mV
Lead Channel Setting Pacing Amplitude: 3 V
Lead Channel Setting Pacing Pulse Width: 0.4 ms
Lead Channel Setting Sensing Sensitivity: 0.3 mV

## 2021-05-05 DIAGNOSIS — I252 Old myocardial infarction: Secondary | ICD-10-CM | POA: Diagnosis not present

## 2021-05-05 DIAGNOSIS — M419 Scoliosis, unspecified: Secondary | ICD-10-CM | POA: Diagnosis not present

## 2021-05-05 DIAGNOSIS — R197 Diarrhea, unspecified: Secondary | ICD-10-CM | POA: Diagnosis not present

## 2021-05-05 DIAGNOSIS — K5900 Constipation, unspecified: Secondary | ICD-10-CM | POA: Diagnosis not present

## 2021-05-05 DIAGNOSIS — R109 Unspecified abdominal pain: Secondary | ICD-10-CM | POA: Diagnosis not present

## 2021-05-05 DIAGNOSIS — R519 Headache, unspecified: Secondary | ICD-10-CM | POA: Diagnosis not present

## 2021-05-05 DIAGNOSIS — B349 Viral infection, unspecified: Secondary | ICD-10-CM | POA: Diagnosis not present

## 2021-05-05 DIAGNOSIS — F17210 Nicotine dependence, cigarettes, uncomplicated: Secondary | ICD-10-CM | POA: Diagnosis not present

## 2021-05-05 DIAGNOSIS — I2510 Atherosclerotic heart disease of native coronary artery without angina pectoris: Secondary | ICD-10-CM | POA: Diagnosis not present

## 2021-05-05 DIAGNOSIS — Z20822 Contact with and (suspected) exposure to covid-19: Secondary | ICD-10-CM | POA: Diagnosis not present

## 2021-05-05 DIAGNOSIS — E876 Hypokalemia: Secondary | ICD-10-CM | POA: Diagnosis not present

## 2021-05-05 DIAGNOSIS — R112 Nausea with vomiting, unspecified: Secondary | ICD-10-CM | POA: Diagnosis not present

## 2021-05-05 DIAGNOSIS — R509 Fever, unspecified: Secondary | ICD-10-CM | POA: Diagnosis not present

## 2021-05-09 ENCOUNTER — Emergency Department (HOSPITAL_COMMUNITY): Payer: Medicare Other

## 2021-05-09 ENCOUNTER — Inpatient Hospital Stay (HOSPITAL_COMMUNITY): Payer: Medicare Other

## 2021-05-09 ENCOUNTER — Encounter (HOSPITAL_COMMUNITY): Payer: Self-pay

## 2021-05-09 ENCOUNTER — Inpatient Hospital Stay (HOSPITAL_COMMUNITY)
Admission: EM | Admit: 2021-05-09 | Discharge: 2021-05-26 | DRG: 871 | Disposition: E | Payer: Medicare Other | Attending: Internal Medicine | Admitting: Internal Medicine

## 2021-05-09 ENCOUNTER — Other Ambulatory Visit (HOSPITAL_COMMUNITY): Payer: Medicare Other

## 2021-05-09 ENCOUNTER — Other Ambulatory Visit: Payer: Self-pay

## 2021-05-09 DIAGNOSIS — I468 Cardiac arrest due to other underlying condition: Secondary | ICD-10-CM | POA: Diagnosis not present

## 2021-05-09 DIAGNOSIS — Z888 Allergy status to other drugs, medicaments and biological substances status: Secondary | ICD-10-CM

## 2021-05-09 DIAGNOSIS — I110 Hypertensive heart disease with heart failure: Secondary | ICD-10-CM | POA: Diagnosis present

## 2021-05-09 DIAGNOSIS — E279 Disorder of adrenal gland, unspecified: Secondary | ICD-10-CM | POA: Diagnosis present

## 2021-05-09 DIAGNOSIS — Z515 Encounter for palliative care: Secondary | ICD-10-CM

## 2021-05-09 DIAGNOSIS — Z95810 Presence of automatic (implantable) cardiac defibrillator: Secondary | ICD-10-CM

## 2021-05-09 DIAGNOSIS — K7200 Acute and subacute hepatic failure without coma: Secondary | ICD-10-CM | POA: Diagnosis present

## 2021-05-09 DIAGNOSIS — J9601 Acute respiratory failure with hypoxia: Secondary | ICD-10-CM | POA: Diagnosis not present

## 2021-05-09 DIAGNOSIS — E871 Hypo-osmolality and hyponatremia: Secondary | ICD-10-CM | POA: Diagnosis present

## 2021-05-09 DIAGNOSIS — G9341 Metabolic encephalopathy: Secondary | ICD-10-CM | POA: Diagnosis present

## 2021-05-09 DIAGNOSIS — R570 Cardiogenic shock: Secondary | ICD-10-CM | POA: Diagnosis present

## 2021-05-09 DIAGNOSIS — I4891 Unspecified atrial fibrillation: Secondary | ICD-10-CM | POA: Diagnosis not present

## 2021-05-09 DIAGNOSIS — I469 Cardiac arrest, cause unspecified: Secondary | ICD-10-CM | POA: Diagnosis not present

## 2021-05-09 DIAGNOSIS — R6521 Severe sepsis with septic shock: Secondary | ICD-10-CM | POA: Diagnosis present

## 2021-05-09 DIAGNOSIS — D696 Thrombocytopenia, unspecified: Secondary | ICD-10-CM | POA: Diagnosis not present

## 2021-05-09 DIAGNOSIS — I5022 Chronic systolic (congestive) heart failure: Secondary | ICD-10-CM | POA: Diagnosis present

## 2021-05-09 DIAGNOSIS — F17210 Nicotine dependence, cigarettes, uncomplicated: Secondary | ICD-10-CM | POA: Diagnosis present

## 2021-05-09 DIAGNOSIS — E785 Hyperlipidemia, unspecified: Secondary | ICD-10-CM | POA: Diagnosis present

## 2021-05-09 DIAGNOSIS — E872 Acidosis, unspecified: Secondary | ICD-10-CM

## 2021-05-09 DIAGNOSIS — N179 Acute kidney failure, unspecified: Secondary | ICD-10-CM | POA: Diagnosis not present

## 2021-05-09 DIAGNOSIS — A419 Sepsis, unspecified organism: Secondary | ICD-10-CM | POA: Diagnosis not present

## 2021-05-09 DIAGNOSIS — I252 Old myocardial infarction: Secondary | ICD-10-CM

## 2021-05-09 DIAGNOSIS — H9190 Unspecified hearing loss, unspecified ear: Secondary | ICD-10-CM | POA: Diagnosis not present

## 2021-05-09 DIAGNOSIS — Z20822 Contact with and (suspected) exposure to covid-19: Secondary | ICD-10-CM | POA: Diagnosis not present

## 2021-05-09 DIAGNOSIS — J9600 Acute respiratory failure, unspecified whether with hypoxia or hypercapnia: Secondary | ICD-10-CM | POA: Diagnosis present

## 2021-05-09 DIAGNOSIS — Z951 Presence of aortocoronary bypass graft: Secondary | ICD-10-CM

## 2021-05-09 DIAGNOSIS — N170 Acute kidney failure with tubular necrosis: Secondary | ICD-10-CM | POA: Diagnosis not present

## 2021-05-09 DIAGNOSIS — R7401 Elevation of levels of liver transaminase levels: Secondary | ICD-10-CM | POA: Diagnosis present

## 2021-05-09 DIAGNOSIS — I255 Ischemic cardiomyopathy: Secondary | ICD-10-CM | POA: Diagnosis not present

## 2021-05-09 DIAGNOSIS — Z955 Presence of coronary angioplasty implant and graft: Secondary | ICD-10-CM | POA: Diagnosis not present

## 2021-05-09 DIAGNOSIS — Z7982 Long term (current) use of aspirin: Secondary | ICD-10-CM

## 2021-05-09 DIAGNOSIS — Z885 Allergy status to narcotic agent status: Secondary | ICD-10-CM

## 2021-05-09 DIAGNOSIS — I2510 Atherosclerotic heart disease of native coronary artery without angina pectoris: Secondary | ICD-10-CM | POA: Diagnosis present

## 2021-05-09 DIAGNOSIS — Z8614 Personal history of Methicillin resistant Staphylococcus aureus infection: Secondary | ICD-10-CM | POA: Diagnosis not present

## 2021-05-09 DIAGNOSIS — Z79899 Other long term (current) drug therapy: Secondary | ICD-10-CM

## 2021-05-09 DIAGNOSIS — Z8249 Family history of ischemic heart disease and other diseases of the circulatory system: Secondary | ICD-10-CM

## 2021-05-09 LAB — BLOOD GAS, ARTERIAL
Acid-base deficit: 15.2 mmol/L — ABNORMAL HIGH (ref 0.0–2.0)
Bicarbonate: 12.2 mmol/L — ABNORMAL LOW (ref 20.0–28.0)
FIO2: 100
O2 Saturation: 98.6 %
Patient temperature: 35
pCO2 arterial: 42.4 mmHg (ref 32.0–48.0)
pH, Arterial: 7.098 — CL (ref 7.350–7.450)
pO2, Arterial: 367 mmHg — ABNORMAL HIGH (ref 83.0–108.0)

## 2021-05-09 LAB — POCT I-STAT 7, (LYTES, BLD GAS, ICA,H+H)
Acid-Base Excess: 0 mmol/L (ref 0.0–2.0)
Acid-base deficit: 6 mmol/L — ABNORMAL HIGH (ref 0.0–2.0)
Bicarbonate: 21.2 mmol/L (ref 20.0–28.0)
Bicarbonate: 23.5 mmol/L (ref 20.0–28.0)
Calcium, Ion: 1.05 mmol/L — ABNORMAL LOW (ref 1.15–1.40)
Calcium, Ion: 0.98 mmol/L — ABNORMAL LOW (ref 1.15–1.40)
HCT: 30 % — ABNORMAL LOW (ref 39.0–52.0)
HCT: 29 % — ABNORMAL LOW (ref 39.0–52.0)
Hemoglobin: 10.2 g/dL — ABNORMAL LOW (ref 13.0–17.0)
Hemoglobin: 9.9 g/dL — ABNORMAL LOW (ref 13.0–17.0)
O2 Saturation: 100 %
O2 Saturation: 99 %
Patient temperature: 99.3
Potassium: 3.4 mmol/L — ABNORMAL LOW (ref 3.5–5.1)
Potassium: 3.5 mmol/L (ref 3.5–5.1)
Sodium: 130 mmol/L — ABNORMAL LOW (ref 135–145)
Sodium: 131 mmol/L — ABNORMAL LOW (ref 135–145)
TCO2: 23 mmol/L (ref 22–32)
TCO2: 24 mmol/L (ref 22–32)
pCO2 arterial: 45.6 mmHg (ref 32.0–48.0)
pCO2 arterial: 33.4 mmHg (ref 32.0–48.0)
pH, Arterial: 7.275 — ABNORMAL LOW (ref 7.350–7.450)
pH, Arterial: 7.457 — ABNORMAL HIGH (ref 7.350–7.450)
pO2, Arterial: 455 mmHg — ABNORMAL HIGH (ref 83.0–108.0)
pO2, Arterial: 129 mmHg — ABNORMAL HIGH (ref 83.0–108.0)

## 2021-05-09 LAB — HEPATIC FUNCTION PANEL
ALT: 70 U/L — ABNORMAL HIGH (ref 0–44)
AST: 107 U/L — ABNORMAL HIGH (ref 15–41)
Albumin: 2 g/dL — ABNORMAL LOW (ref 3.5–5.0)
Alkaline Phosphatase: 165 U/L — ABNORMAL HIGH (ref 38–126)
Bilirubin, Direct: 1 mg/dL — ABNORMAL HIGH (ref 0.0–0.2)
Indirect Bilirubin: 0.8 mg/dL (ref 0.3–0.9)
Total Bilirubin: 1.8 mg/dL — ABNORMAL HIGH (ref 0.3–1.2)
Total Protein: 4.6 g/dL — ABNORMAL LOW (ref 6.5–8.1)

## 2021-05-09 LAB — BASIC METABOLIC PANEL
Anion gap: 17 — ABNORMAL HIGH (ref 5–15)
BUN: 73 mg/dL — ABNORMAL HIGH (ref 8–23)
CO2: 15 mmol/L — ABNORMAL LOW (ref 22–32)
Calcium: 8.1 mg/dL — ABNORMAL LOW (ref 8.9–10.3)
Chloride: 96 mmol/L — ABNORMAL LOW (ref 98–111)
Creatinine, Ser: 3.72 mg/dL — ABNORMAL HIGH (ref 0.61–1.24)
GFR, Estimated: 17 mL/min — ABNORMAL LOW (ref >60)
Glucose, Bld: 184 mg/dL — ABNORMAL HIGH (ref 70–99)
Potassium: 4.4 mmol/L (ref 3.5–5.1)
Sodium: 128 mmol/L — ABNORMAL LOW (ref 135–145)

## 2021-05-09 LAB — CBC WITH DIFFERENTIAL/PLATELET
Band Neutrophils: 7 %
Basophils Absolute: 0 10*3/uL (ref 0.0–0.1)
Basophils Relative: 0 %
Eosinophils Absolute: 0 10*3/uL (ref 0.0–0.5)
Eosinophils Relative: 0 %
HCT: 31.4 % — ABNORMAL LOW (ref 39.0–52.0)
Hemoglobin: 10.5 g/dL — ABNORMAL LOW (ref 13.0–17.0)
Lymphocytes Relative: 12 %
Lymphs Abs: 1.4 10*3/uL (ref 0.7–4.0)
MCH: 33 pg (ref 26.0–34.0)
MCHC: 33.4 g/dL (ref 30.0–36.0)
MCV: 98.7 fL (ref 80.0–100.0)
Metamyelocytes Relative: 2 %
Monocytes Absolute: 0.7 10*3/uL (ref 0.1–1.0)
Monocytes Relative: 6 %
Neutro Abs: 9.5 10*3/uL — ABNORMAL HIGH (ref 1.7–7.7)
Neutrophils Relative %: 73 %
Platelets: 31 10*3/uL — ABNORMAL LOW (ref 150–400)
RBC: 3.18 MIL/uL — ABNORMAL LOW (ref 4.22–5.81)
RDW: 13.9 % (ref 11.5–15.5)
WBC: 11.9 10*3/uL — ABNORMAL HIGH (ref 4.0–10.5)
nRBC: 0 % (ref 0.0–0.2)

## 2021-05-09 LAB — TECHNOLOGIST SMEAR REVIEW: Plt Morphology: NORMAL

## 2021-05-09 LAB — ECHOCARDIOGRAM COMPLETE
AR max vel: 2.02 cm2
AV Area VTI: 2.11 cm2
AV Area mean vel: 2.18 cm2
AV Mean grad: 7 mmHg
AV Peak grad: 12.1 mmHg
Ao pk vel: 1.74 m/s
Area-P 1/2: 5.12 cm2
Height: 67 in
S' Lateral: 3 cm
Single Plane A4C EF: 53.8 %
Weight: 2589.08 oz

## 2021-05-09 LAB — GLUCOSE, CAPILLARY
Glucose-Capillary: 129 mg/dL — ABNORMAL HIGH (ref 70–99)
Glucose-Capillary: 141 mg/dL — ABNORMAL HIGH (ref 70–99)

## 2021-05-09 LAB — TROPONIN I (HIGH SENSITIVITY)
Troponin I (High Sensitivity): 15 ng/L (ref <18)
Troponin I (High Sensitivity): 72 ng/L — ABNORMAL HIGH (ref <18)

## 2021-05-09 LAB — PROCALCITONIN: Procalcitonin: 25.43 ng/mL

## 2021-05-09 LAB — CBG MONITORING, ED: Glucose-Capillary: 179 mg/dL — ABNORMAL HIGH (ref 70–99)

## 2021-05-09 LAB — ETHANOL: Alcohol, Ethyl (B): <10 mg/dL (ref <10)

## 2021-05-09 LAB — APTT: aPTT: 40 seconds — ABNORMAL HIGH (ref 24–36)

## 2021-05-09 LAB — LACTIC ACID, PLASMA
Lactic Acid, Venous: 7.6 mmol/L (ref 0.5–1.9)
Lactic Acid, Venous: 7.6 mmol/L (ref 0.5–1.9)

## 2021-05-09 LAB — HIV ANTIBODY (ROUTINE TESTING W REFLEX): HIV Screen 4th Generation wRfx: NONREACTIVE

## 2021-05-09 LAB — SAVE SMEAR(SSMR), FOR PROVIDER SLIDE REVIEW

## 2021-05-09 LAB — RESP PANEL BY RT-PCR (FLU A&B, COVID) ARPGX2
Influenza A by PCR: NEGATIVE
Influenza B by PCR: NEGATIVE
SARS Coronavirus 2 by RT PCR: NEGATIVE

## 2021-05-09 LAB — PHOSPHORUS: Phosphorus: 5 mg/dL — ABNORMAL HIGH (ref 2.5–4.6)

## 2021-05-09 LAB — MAGNESIUM: Magnesium: 1.7 mg/dL (ref 1.7–2.4)

## 2021-05-09 LAB — ACETAMINOPHEN LEVEL: Acetaminophen (Tylenol), Serum: <10 ug/mL — ABNORMAL LOW (ref 10–30)

## 2021-05-09 LAB — SALICYLATE LEVEL: Salicylate Lvl: <7 mg/dL — ABNORMAL LOW (ref 7.0–30.0)

## 2021-05-09 LAB — PROTIME-INR
INR: 1.5 — ABNORMAL HIGH (ref 0.8–1.2)
Prothrombin Time: 18.5 seconds — ABNORMAL HIGH (ref 11.4–15.2)

## 2021-05-09 MED ORDER — LACTATED RINGERS IV BOLUS (SEPSIS)
1000.0000 mL | Freq: Once | INTRAVENOUS | Status: AC
  Administered 2021-05-09: 1000 mL via INTRAVENOUS

## 2021-05-09 MED ORDER — SODIUM CHLORIDE 0.9 % IV SOLN
INTRAVENOUS | Status: DC | PRN
  Administered 2021-05-09: 1000 mL via INTRAVENOUS

## 2021-05-09 MED ORDER — MIDAZOLAM HCL 2 MG/2ML IJ SOLN
1.0000 mg | INTRAMUSCULAR | Status: DC | PRN
Start: 2021-05-09 — End: 2021-05-09
  Administered 2021-05-09 (×2): 1 mg via INTRAVENOUS
  Filled 2021-05-09 (×2): qty 2

## 2021-05-09 MED ORDER — ACETAMINOPHEN 325 MG PO TABS: 650.0000 mg | ORAL_TABLET | ORAL | Status: DC

## 2021-05-09 MED ORDER — BUSPIRONE HCL 15 MG PO TABS: 30.0000 mg | ORAL_TABLET | Freq: Three times a day (TID) | ORAL | Status: DC

## 2021-05-09 MED ORDER — MORPHINE BOLUS VIA INFUSION
4.0000 mg | INTRAVENOUS | Status: DC | PRN
Start: 2021-05-09 — End: 2021-05-10
  Filled 2021-05-09: qty 4

## 2021-05-09 MED ORDER — SODIUM CHLORIDE 0.9 % IV SOLN: 250.0000 mL | INTRAVENOUS | Status: DC

## 2021-05-09 MED ORDER — METRONIDAZOLE 500 MG/100ML IV SOLN
500.0000 mg | Freq: Once | INTRAVENOUS | Status: AC
  Administered 2021-05-09: 500 mg via INTRAVENOUS
  Filled 2021-05-09: qty 100

## 2021-05-09 MED ORDER — SODIUM CHLORIDE 0.9 % IV SOLN
2.0000 g | Freq: Once | INTRAVENOUS | Status: AC
  Administered 2021-05-09: 2 g via INTRAVENOUS
  Filled 2021-05-09: qty 2

## 2021-05-09 MED ORDER — FENTANYL CITRATE PF 50 MCG/ML IJ SOSY
50.0000 ug | PREFILLED_SYRINGE | INTRAMUSCULAR | Status: DC | PRN
  Administered 2021-05-09: 50 ug via INTRAVENOUS
  Filled 2021-05-09: qty 1

## 2021-05-09 MED ORDER — LACTATED RINGERS IV SOLN: INTRAVENOUS | Status: DC

## 2021-05-09 MED ORDER — HEPARIN SODIUM (PORCINE) 5000 UNIT/ML IJ SOLN: 5000.0000 [IU] | Freq: Three times a day (TID) | INTRAMUSCULAR | Status: DC

## 2021-05-09 MED ORDER — VANCOMYCIN VARIABLE DOSE PER UNSTABLE RENAL FUNCTION (PHARMACIST DOSING): Status: DC

## 2021-05-09 MED ORDER — EPINEPHRINE (ANAPHYLAXIS) 30 MG/30ML IJ SOLN
0.5000 ug/min | INTRAMUSCULAR | Status: DC
  Administered 2021-05-09: 22 ug/min via INTRAVENOUS
  Administered 2021-05-09: 55 ug/min via INTRAVENOUS
  Filled 2021-05-09 (×4): qty 10

## 2021-05-09 MED ORDER — VASOPRESSIN 20 UNITS/100 ML INFUSION FOR SHOCK
0.0000 [IU]/min | INTRAVENOUS | Status: DC
  Administered 2021-05-09: 0.03 [IU]/min via INTRAVENOUS
  Filled 2021-05-09 (×3): qty 100

## 2021-05-09 MED ORDER — MIDAZOLAM HCL 2 MG/2ML IJ SOLN: 1.0000 mg | INTRAMUSCULAR | Status: DC | PRN

## 2021-05-09 MED ORDER — LACTATED RINGERS IV BOLUS
1000.0000 mL | Freq: Once | INTRAVENOUS | Status: AC
  Administered 2021-05-09: 1000 mL via INTRAVENOUS

## 2021-05-09 MED ORDER — NOREPINEPHRINE 16 MG/250ML-% IV SOLN
0.0000 ug/min | INTRAVENOUS | Status: DC
  Administered 2021-05-09: 60 ug/min via INTRAVENOUS
  Administered 2021-05-09: 23 ug/min via INTRAVENOUS
  Filled 2021-05-09 (×3): qty 250

## 2021-05-09 MED ORDER — SODIUM CHLORIDE 0.9 % IV SOLN: 2.0000 g | INTRAVENOUS | Status: DC

## 2021-05-09 MED ORDER — FENTANYL CITRATE PF 50 MCG/ML IJ SOSY
50.0000 ug | PREFILLED_SYRINGE | INTRAMUSCULAR | Status: DC | PRN
Start: 2021-05-09 — End: 2021-05-09

## 2021-05-09 MED ORDER — EPINEPHRINE 1 MG/10ML IJ SOSY
PREFILLED_SYRINGE | INTRAMUSCULAR | Status: AC | PRN
  Administered 2021-05-09 (×2): 1 mg via INTRAVENOUS

## 2021-05-09 MED ORDER — DOCUSATE SODIUM 50 MG/5ML PO LIQD: 100.0000 mg | Freq: Two times a day (BID) | ORAL | Status: DC

## 2021-05-09 MED ORDER — POLYETHYLENE GLYCOL 3350 17 G PO PACK: 17.0000 g | PACK | Freq: Every day | ORAL | Status: DC

## 2021-05-09 MED ORDER — ACETAMINOPHEN 650 MG RE SUPP: 650.0000 mg | RECTAL | Status: DC

## 2021-05-09 MED ORDER — INSULIN ASPART 100 UNIT/ML IJ SOLN: 0.0000 [IU] | INTRAMUSCULAR | Status: DC

## 2021-05-09 MED ORDER — GLYCOPYRROLATE 0.2 MG/ML IJ SOLN
0.1000 mg | Freq: Once | INTRAMUSCULAR | Status: AC
  Administered 2021-05-09: 0.1 mg via INTRAVENOUS
  Filled 2021-05-09: qty 1

## 2021-05-09 MED ORDER — SODIUM BICARBONATE 8.4 % IV SOLN
INTRAVENOUS | Status: AC
  Administered 2021-05-09: 50 meq
  Filled 2021-05-09: qty 150

## 2021-05-09 MED ORDER — SODIUM BICARBONATE 8.4 % IV SOLN
INTRAVENOUS | Status: AC
  Filled 2021-05-09: qty 50

## 2021-05-09 MED ORDER — FENTANYL 2500MCG IN NS 250ML (10MCG/ML) PREMIX INFUSION
40.0000 ug/h | INTRAVENOUS | Status: DC
  Administered 2021-05-09: 40 ug/h via INTRAVENOUS
  Filled 2021-05-09: qty 250

## 2021-05-09 MED ORDER — VANCOMYCIN HCL 500 MG/100ML IV SOLN
500.0000 mg | Freq: Once | INTRAVENOUS | Status: DC
  Filled 2021-05-09: qty 100

## 2021-05-09 MED ORDER — EPINEPHRINE 1 MG/10ML IJ SOSY
PREFILLED_SYRINGE | INTRAMUSCULAR | Status: AC | PRN
  Administered 2021-05-09: 1 mg via INTRAVENOUS

## 2021-05-09 MED ORDER — EPINEPHRINE HCL 5 MG/250ML IV SOLN IN NS
INTRAVENOUS | Status: AC
  Filled 2021-05-09: qty 250

## 2021-05-09 MED ORDER — LACTATED RINGERS IV BOLUS (SEPSIS)
250.0000 mL | Freq: Once | INTRAVENOUS | Status: AC
  Administered 2021-05-09: 250 mL via INTRAVENOUS

## 2021-05-09 MED ORDER — METRONIDAZOLE 500 MG/100ML IV SOLN: 500.0000 mg | Freq: Two times a day (BID) | INTRAVENOUS | Status: DC

## 2021-05-09 MED ORDER — SODIUM BICARBONATE 8.4 % IV SOLN
50.0000 meq | Freq: Once | INTRAVENOUS | Status: AC
  Administered 2021-05-09: 50 meq via INTRAVENOUS

## 2021-05-09 MED ORDER — ACETAMINOPHEN 160 MG/5ML PO SOLN: 650.0000 mg | ORAL | Status: DC

## 2021-05-09 MED ORDER — PANTOPRAZOLE SODIUM 40 MG IV SOLR: 40.0000 mg | Freq: Every day | INTRAVENOUS | Status: DC

## 2021-05-09 MED ORDER — ONDANSETRON HCL 4 MG/2ML IJ SOLN: 4.0000 mg | Freq: Four times a day (QID) | INTRAMUSCULAR | Status: DC | PRN

## 2021-05-09 MED ORDER — NALOXONE HCL 2 MG/2ML IJ SOSY
PREFILLED_SYRINGE | INTRAMUSCULAR | Status: AC | PRN
  Administered 2021-05-09 (×2): 2 mg via INTRAVENOUS

## 2021-05-09 MED ORDER — EPINEPHRINE HCL 5 MG/250ML IV SOLN IN NS
0.5000 ug/min | INTRAVENOUS | Status: DC
  Administered 2021-05-09: 50 ug/min via INTRAVENOUS

## 2021-05-09 MED ORDER — STERILE WATER FOR INJECTION IV SOLN
INTRAVENOUS | Status: DC
  Filled 2021-05-09 (×2): qty 1000

## 2021-05-09 MED ORDER — MAGNESIUM SULFATE 2 GM/50ML IV SOLN
2.0000 g | Freq: Once | INTRAVENOUS | Status: AC
  Administered 2021-05-09: 2 g via INTRAVENOUS
  Filled 2021-05-09: qty 50

## 2021-05-09 MED ORDER — DEXTROSE 50 % IV SOLN
INTRAVENOUS | Status: AC | PRN
  Administered 2021-05-09: 1 via INTRAVENOUS

## 2021-05-09 MED ORDER — NOREPINEPHRINE 4 MG/250ML-% IV SOLN
2.0000 ug/min | INTRAVENOUS | Status: DC
  Administered 2021-05-09: 10 ug/min via INTRAVENOUS

## 2021-05-09 MED ORDER — FENTANYL CITRATE (PF) 100 MCG/2ML IJ SOLN: 50.0000 ug | INTRAMUSCULAR | Status: DC | PRN

## 2021-05-09 MED ORDER — HYDROCORTISONE SOD SUC (PF) 100 MG IJ SOLR
100.0000 mg | Freq: Two times a day (BID) | INTRAMUSCULAR | Status: DC
  Administered 2021-05-09: 100 mg via INTRAVENOUS
  Filled 2021-05-09: qty 2

## 2021-05-09 MED ORDER — NOREPINEPHRINE 4 MG/250ML-% IV SOLN
INTRAVENOUS | Status: AC
  Administered 2021-05-09: 5 ug/min via INTRAVENOUS
  Filled 2021-05-09: qty 250

## 2021-05-09 MED ORDER — VANCOMYCIN HCL IN DEXTROSE 1-5 GM/200ML-% IV SOLN
1000.0000 mg | Freq: Once | INTRAVENOUS | Status: AC
  Administered 2021-05-09: 1000 mg via INTRAVENOUS
  Filled 2021-05-09: qty 200

## 2021-05-09 MED ORDER — METHYLENE BLUE 0.5 % INJ SOLN
2.0000 mg/kg | Freq: Once | Status: AC
  Administered 2021-05-09: 147 mg via INTRAVENOUS
  Filled 2021-05-09: qty 29.4

## 2021-05-09 MED ORDER — MORPHINE 100MG IN NS 100ML (1MG/ML) PREMIX INFUSION
5.0000 mg/h | INTRAVENOUS | Status: DC
  Administered 2021-05-09: 5 mg/h via INTRAVENOUS
  Filled 2021-05-09: qty 100

## 2021-05-09 MED ORDER — SODIUM BICARBONATE 8.4 % IV SOLN
INTRAVENOUS | Status: DC
  Filled 2021-05-09 (×4): qty 1000

## 2021-05-09 MED ORDER — MIDAZOLAM HCL 2 MG/2ML IJ SOLN
2.0000 mg | INTRAMUSCULAR | Status: DC | PRN
  Administered 2021-05-09: 2 mg via INTRAVENOUS
  Filled 2021-05-09: qty 2

## 2021-05-09 MED FILL — Medication: Qty: 1 | Status: AC

## 2021-05-10 LAB — BLOOD CULTURE ID PANEL (REFLEXED) - BCID2

## 2021-05-10 LAB — CBG MONITORING, ED: Glucose-Capillary: 67 mg/dL — ABNORMAL LOW (ref 70–99)

## 2021-05-10 LAB — PATHOLOGIST SMEAR REVIEW

## 2021-05-11 MED FILL — Medication: Qty: 1 | Status: AC

## 2021-05-12 LAB — CULTURE, BLOOD (ROUTINE X 2)

## 2021-05-22 MED FILL — Midazolam HCl Inj 5 MG/5ML (Base Equivalent): INTRAMUSCULAR | Qty: 3 | Status: AC

## 2021-05-26 NOTE — ED Triage Notes (Signed)
Wife reports last week pt was having sore throat, cough, chills, body aches, headaches, and vomiting.  Reports tested negative for covid last week.  Reports was told to use tyelnol prn. Wife says pt has been  getting worse.  Reports 2 days ago he was having cramps from his waste up.  Reports yesterday was having slurred speech and hallucinating.  This morning wife says she woke up and pt was getting up getting ready for work around [employer]. She said he was confused and thought it was time to go to work.

## 2021-05-26 NOTE — Progress Notes (Signed)
   08-Jun-2021 1005  Clinical Encounter Type  Visited With Patient not available  Visit Type Initial;Code  Referral From Nurse  Consult/Referral To Chaplain   Chaplain responded to Code Blue. Pt being treated and no support person present. No current spiritual care needs. Chaplain remains available.   This note was prepared by Chaplain Resident, Tacy Learn, MDiv. Chaplain remains available as needed through the on-call pager: [phone].

## 2021-05-26 NOTE — Procedures (Signed)
Extubation Procedure Note  Patient Details:   Name: [name] DOB: Dec 07, 1955 MRN: [id number]   Airway Documentation:    Vent end date: 06/05/2021 Vent end time: 1808   Evaluation  O2 sats: stable throughout Complications: No apparent complications Patient did tolerate procedure well. Bilateral Breath Sounds: Diminished   Yes  Patient extubated per comfort care measures. RN and family at bedside.  Harmon Dun Kalli Greenfield 06-05-21, 6:09 PM

## 2021-05-26 NOTE — ED Notes (Signed)
Date and time results received: May 13, 2021 6:02 AM   Test: Lactic Acid Critical Value: 7.6  Name of Provider Notified: Dr. Bebe Shaggy  Orders Received? Or Actions Taken?: Acknowledged

## 2021-05-26 NOTE — ED Notes (Addendum)
CPR Total time: 12 minutes  Compression initiated at 4:49 ROSC obtained at 5:01 Total Epi x3, 4 Narcan, and 1 amp dextrose.

## 2021-05-26 NOTE — Death Summary Note (Signed)
DEATH SUMMARY   Patient Details  Name: [name] MRN: [id number] DOB: [birth year]  Admission/Discharge Information   Admit Date:  Jun 02, 2021  Date of Death: Date of Death: 2021/06/02  Time of Death: Time of Death: 1907  Length of Stay: 1  Referring Physician: Oval Linsey, MD    Diagnoses  Cardiac arrest Profound mixed cardiogenic septic shock Acute renal failure Acute liver failure Metabolic encephalopathy Acute respiratory failure  Brief Hospital Course (including significant findings, care, treatment, and services provided and events leading to death)  [name] is a 65 y/o male with a PMHx of CAD s/p STEMI (2010, 2019), CABG (2012), ischemic cardiomyopathy, HFrEF s/p AICD placement complicated by MSSA bacteremia who presents to the ED for cardiac arrest.    Per EMS report, patient has been experiencing sore throat, cough, chills, body aches, headache and vomiting for the past week. He tested negative for COVID-19. Then 2 days ago, patient began experiencing muscle cramps. Last 24 hours, he developed slurred speech with hallucinations. On arrival to the ED, patient was noted to be pulseless. He received 12 minutes of CPR with epi, dextrose and Narcan. No shocks.   On arrival to ICU his pressor needs continued to progress.  Met with family to discuss options and they said he would never have wanted such aggressive measures.  He was then transitioned to comfort care and passed with family at bedside.   Pertinent Labs and Studies  Significant Diagnostic Studies CT HEAD WO CONTRAST ( )  Result Date: June 02, 2021 CLINICAL DATA:  Mental status change. EXAM: CT HEAD WITHOUT CONTRAST TECHNIQUE: Contiguous axial images were obtained from the base of the skull through the vertex without intravenous contrast. COMPARISON:  None. FINDINGS: Brain: Image quality degraded by positioning and beam hardening artifact from overlying support apparatus. Within this limitation, there  is no evidence for an acute hemorrhage, hydrocephalus, mass lesion, or abnormal extra-axial fluid collection. Vascular: No hyperdense vessel or unexpected calcification. Skull: No evidence for fracture. No worrisome lytic or sclerotic lesion. Sinuses/Orbits: Mucosal thickening with small air-fluid levels in the maxillary sinuses are nonspecific given the presence of the NG and endotracheal tubes. Visualized portions of the globes and intraorbital fat are unremarkable. Other: None. IMPRESSION: No acute intracranial abnormality. Electronically Signed   By: Kennith Center [name].   On: 02-Jun-2021 06:16   DG CHEST PORT 1 VIEW  Result Date: 06-02-2021 CLINICAL DATA:  Cardiac arrest. EXAM: PORTABLE CHEST 1 VIEW COMPARISON:  Same day. FINDINGS: The heart size and mediastinal contours are within normal limits. Status post coronary artery bypass graft. Endotracheal and nasogastric tubes are in grossly good position. Stable position of right-sided defibrillator. Both lungs are clear. The visualized skeletal structures are unremarkable. IMPRESSION: Stable support apparatus. No acute cardiopulmonary abnormality seen. Electronically Signed   By: Lupita Raider [name].   On: 06/02/21 11:16   DG Chest Portable 1 View  Result Date: June 02, 2021 CLINICAL DATA:  Cardiac arrest EXAM: PORTABLE CHEST 1 VIEW COMPARISON:  02/07/2021 FINDINGS: Normal heart size and mediastinal contours. CABG and coronary stenting. Dual-chamber pacing/defibrillator leads from the right. Endotracheal tube with tip between the clavicular heads and carina. The enteric tube reaches the stomach. Artifact from defibrillator and EKG pads. There is no edema, consolidation, effusion, or pneumothorax. No detected fracture IMPRESSION: Unremarkable hardware and stable chest radiograph. Electronically Signed   By: Tiburcio Pea [name].   On: 06/02/21 05:30   ECHOCARDIOGRAM COMPLETE  Result Date: 2021-06-02    ECHOCARDIOGRAM REPORT  Patient Name:   [name]  [facility] Date of Exam: Jun 07, 2021 Medical Rec #:  [id number]        Height:       67.0 in Accession #:    [id number]       Weight:       161.8 lb Date of Birth:  [birth year]        BSA:          1.848 m Patient Age:    31 years         BP:           90/71 mmHg Patient Gender: M                HR:           105 bpm. Exam Location:  Inpatient Procedure: 2D Echo, Cardiac Doppler and Color Doppler Indications:    Cardaic Arrest  History:        Patient has prior history of Echocardiogram examinations, most                 recent 10/15/2017. CAD; Risk Factors:Hypertension. CHF.  Sonographer:    MH Referring Phys: [place]  1. Global hypokinesis with mid-apical anterior, anteroseptal, and apical akinesis.. Left ventricular ejection fraction, by estimation, is 35 to 40%. The left ventricle has moderately decreased function. The left ventricle demonstrates regional wall motion abnormalities (see scoring diagram/findings for description). Left ventricular diastolic parameters are indeterminate.  2. Right ventricular systolic function is normal. The right ventricular size is normal. There is normal pulmonary artery systolic pressure.  3. Left atrial size was moderately dilated.  4. Right atrial size was mildly dilated.  5. The mitral valve is normal in structure. Trivial mitral valve regurgitation. No evidence of mitral stenosis.  6. The aortic valve is tricuspid. Aortic valve regurgitation is not visualized. No aortic stenosis is present.  7. The inferior vena cava is dilated in size with >50% respiratory variability, suggesting right atrial pressure of 8 mmHg. FINDINGS  Left Ventricle: Global hypokinesis with mid-apical anterior, anteroseptal, and apical akinesis. Left ventricular ejection fraction, by estimation, is 35 to 40%. The left ventricle has moderately decreased function. The left ventricle demonstrates regional wall motion abnormalities. The left ventricular internal cavity size was normal in  size. There is no left ventricular hypertrophy. Left ventricular diastolic parameters are indeterminate. Right Ventricle: The right ventricular size is normal. No increase in right ventricular wall thickness. Right ventricular systolic function is normal. There is normal pulmonary artery systolic pressure. The tricuspid regurgitant velocity is 1.41 m/s, and  with an assumed right atrial pressure of 8 mmHg, the estimated right ventricular systolic pressure is 10.1 mmHg. Left Atrium: Left atrial size was moderately dilated. Right Atrium: Right atrial size was mildly dilated. Pericardium: There is no evidence of pericardial effusion. Mitral Valve: The mitral valve is normal in structure. Mild mitral annular calcification. Trivial mitral valve regurgitation. No evidence of mitral valve stenosis. Tricuspid Valve: The tricuspid valve is normal in structure. Tricuspid valve regurgitation is trivial. No evidence of tricuspid stenosis. Aortic Valve: The aortic valve is tricuspid. Aortic valve regurgitation is not visualized. No aortic stenosis is present. Aortic valve mean gradient measures 7.0 mmHg. Aortic valve peak gradient measures 12.1 mmHg. Aortic valve area, by VTI measures 2.11  cm. Pulmonic Valve: The pulmonic valve was normal in structure. Pulmonic valve regurgitation is not visualized. No evidence of pulmonic stenosis. Aorta: The aortic root is normal in size and  structure. Venous: The inferior vena cava is dilated in size with greater than 50% respiratory variability, suggesting right atrial pressure of 8 mmHg. IAS/Shunts: No atrial level shunt detected by color flow Doppler. Additional Comments: A device lead is visualized.  LEFT VENTRICLE PLAX 2D LVIDd:         4.25 cm     Diastology LVIDs:         3.00 cm     LV e' medial:  11.30 cm/s LV PW:         0.97 cm     LV e' lateral: 9.03 cm/s LV IVS:        0.82 cm LVOT diam:     2.00 cm LV SV:         59 LV SV Index:   32 LVOT Area:     3.14 cm  LV Volumes (MOD)  LV vol d, MOD A4C: 48.5 ml LV vol s, MOD A4C: 22.4 ml LV SV MOD A4C:     48.5 ml RIGHT VENTRICLE            IVC RV S prime:     7.51 cm/s  IVC diam: 2.30 cm TAPSE (M-mode): 1.5 cm LEFT ATRIUM             Index       RIGHT ATRIUM           Index LA diam:        4.30 cm 2.33 cm/m  RA Area:     19.80 cm LA Vol (A2C):   48.8 ml 26.41 ml/m RA Volume:   55.20 ml  29.87 ml/m LA Vol (A4C):   63.1 ml 34.14 ml/m LA Biplane Vol: 56.6 ml 30.63 ml/m  AORTIC VALVE                    PULMONIC VALVE AV Area (Vmax):    2.02 cm     PV Vmax:       0.66 m/s AV Area (Vmean):   2.18 cm     PV Peak grad:  1.7 mmHg AV Area (VTI):     2.11 cm AV Vmax:           174.00 cm/s AV Vmean:          124.000 cm/s AV VTI:            0.278 m AV Peak Grad:      12.1 mmHg AV Mean Grad:      7.0 mmHg LVOT Vmax:         112.00 cm/s LVOT Vmean:        86.000 cm/s LVOT VTI:          0.187 m LVOT/AV VTI ratio: 0.67  AORTA Ao Root diam: 3.40 cm Ao Asc diam:  3.60 cm MITRAL VALVE            TRICUSPID VALVE MV Area (PHT): 5.12 cm TR Peak grad:   8.0 mmHg                         TR Vmax:        141.00 cm/s                          SHUNTS                         Systemic VTI:  0.19 m                         Systemic Diam: 2.00 cm Skeet Latch MD Electronically signed by Skeet Latch MD Signature Date/Time: May 12, 2021/4:01:25 PM    Final    CUP PACEART REMOTE DEVICE CHECK  Result Date: 05/03/2021 Scheduled remote reviewed. Normal device function.  The device estimates 10 month until ERI, sent to triage Next remote 91 days. Kathy Breach, RN, CCDS, [employer]   Microbiology Recent Results (from the past 240 hour(s))  Resp Panel by RT-PCR (Flu A&B, Covid) Nasopharyngeal Swab     Status: None   Collection Time: May 12, 2021  5:01 AM   Specimen: Nasopharyngeal Swab; Nasopharyngeal(NP) swabs in vial transport medium  Result Value Ref Range Status   SARS Coronavirus 2 by RT PCR NEGATIVE NEGATIVE Final    Comment: (NOTE) SARS-CoV-2  target nucleic acids are NOT DETECTED.  The SARS-CoV-2 RNA is generally detectable in upper respiratory specimens during the acute phase of infection. The lowest concentration of SARS-CoV-2 viral copies this assay can detect is 138 copies/mL. A negative result does not preclude SARS-Cov-2 infection and should not be used as the sole basis for treatment or other patient management decisions. A negative result may occur with  improper specimen collection/handling, submission of specimen other than nasopharyngeal swab, presence of viral mutation(s) within the areas targeted by this assay, and inadequate number of viral copies(<138 copies/mL). A negative result must be combined with clinical observations, patient history, and epidemiological information. The expected result is Negative.  Fact Sheet for Patients:  EntrepreneurPulse.com.au  Fact Sheet for Healthcare Providers:  IncredibleEmployment.be  This test is no t yet approved or cleared by the [place] FDA and  has been authorized for detection and/or diagnosis of SARS-CoV-2 by FDA under an Emergency Use Authorization (EUA). This EUA will remain  in effect (meaning this test can be used) for the duration of the COVID-19 declaration under Section 564(b)(1) of the Act, 21 U.S.C.section 360bbb-3(b)(1), unless the authorization is terminated  or revoked sooner.       Influenza A by PCR NEGATIVE NEGATIVE Final   Influenza B by PCR NEGATIVE NEGATIVE Final    Comment: (NOTE) The Xpert Xpress SARS-CoV-2/FLU/RSV plus assay is intended as an aid in the diagnosis of influenza from Nasopharyngeal swab specimens and should not be used as a sole basis for treatment. Nasal washings and aspirates are unacceptable for Xpert Xpress SARS-CoV-2/FLU/RSV testing.  Fact Sheet for Patients: EntrepreneurPulse.com.au  Fact Sheet for Healthcare  Providers: IncredibleEmployment.be  This test is not yet approved or cleared by the [place] FDA and has been authorized for detection and/or diagnosis of SARS-CoV-2 by FDA under an Emergency Use Authorization (EUA). This EUA will remain in effect (meaning this test can be used) for the duration of the COVID-19 declaration under Section 564(b)(1) of the Act, 21 U.S.C. section 360bbb-3(b)(1), unless the authorization is terminated or revoked.  Performed at [facility], [address] [place], [place] [address]   Blood culture (routine x 2)     Status: None (Preliminary result)   Collection Time: 2021/05/12  5:31 AM   Specimen: BLOOD RIGHT HAND  Result Value Ref Range Status   Specimen Description   Final    BLOOD RIGHT HAND Performed at [facility], [address] [place], [place] [address]    Special Requests   Final    BOTTLES DRAWN AEROBIC AND ANAEROBIC Blood Culture results  may not be optimal due to an inadequate volume of blood received in culture bottles Performed at [facility] [address] [place], [place] [address]    Culture  Setup Time   Final    GRAM POSITIVE COCCI BOTH Gram Stain Report Called to,Read Back By and Verified With: ANDREA FIELDS, 1917,05-13-2021,SELF S. GSDONE AT [facility] PATIENT DISCHARGED OR EXPIRED Performed at [facility], [place] [address], [place], [place] [address]    Culture GRAM POSITIVE COCCI  Final   Report Status PENDING  Incomplete  Blood Culture ID Panel (Reflexed)     Status: Abnormal   Collection Time: 05/13/2021  5:31 AM  Result Value Ref Range Status   Enterococcus faecalis NOT DETECTED NOT DETECTED Final   Enterococcus Faecium NOT DETECTED NOT DETECTED Final   Listeria monocytogenes NOT DETECTED NOT DETECTED Final   Staphylococcus species DETECTED (A) NOT DETECTED Final    Comment: PATIENT DISCHARGED OR EXPIRED   Staphylococcus aureus (BCID) DETECTED (A) NOT DETECTED Final    Comment: PATIENT DISCHARGED OR  EXPIRED   Staphylococcus epidermidis NOT DETECTED NOT DETECTED Final   Staphylococcus lugdunensis NOT DETECTED NOT DETECTED Final   Streptococcus species NOT DETECTED NOT DETECTED Final   Streptococcus agalactiae NOT DETECTED NOT DETECTED Final   Streptococcus pneumoniae NOT DETECTED NOT DETECTED Final   Streptococcus pyogenes NOT DETECTED NOT DETECTED Final   A.calcoaceticus-baumannii NOT DETECTED NOT DETECTED Final   Bacteroides fragilis NOT DETECTED NOT DETECTED Final   Enterobacterales NOT DETECTED NOT DETECTED Final   Enterobacter cloacae complex NOT DETECTED NOT DETECTED Final   Escherichia coli NOT DETECTED NOT DETECTED Final   Klebsiella aerogenes NOT DETECTED NOT DETECTED Final   Klebsiella oxytoca NOT DETECTED NOT DETECTED Final   Klebsiella pneumoniae NOT DETECTED NOT DETECTED Final   Proteus species NOT DETECTED NOT DETECTED Final   Salmonella species NOT DETECTED NOT DETECTED Final   Serratia marcescens NOT DETECTED NOT DETECTED Final   Haemophilus influenzae NOT DETECTED NOT DETECTED Final   Neisseria meningitidis NOT DETECTED NOT DETECTED Final   Pseudomonas aeruginosa NOT DETECTED NOT DETECTED Final   Stenotrophomonas maltophilia NOT DETECTED NOT DETECTED Final   Candida albicans NOT DETECTED NOT DETECTED Final   Candida auris NOT DETECTED NOT DETECTED Final   Candida glabrata NOT DETECTED NOT DETECTED Final   Candida krusei NOT DETECTED NOT DETECTED Final   Candida parapsilosis NOT DETECTED NOT DETECTED Final   Candida tropicalis NOT DETECTED NOT DETECTED Final   Cryptococcus neoformans/gattii NOT DETECTED NOT DETECTED Final   Meth resistant mecA/C and MREJ NOT DETECTED NOT DETECTED Final    Comment: Performed at [facility], [address] [place], [place] [address]    Lab Basic Metabolic Panel: Recent Labs  Lab 2021/05/13 0501 May 13, 2021 0524 2021/05/13 1041 May 13, 2021 1147 May 13, 2021 1410  NA 128*  --  130*  --  131*  K 4.4  --  3.4*  --  3.5  CL 96*  --    --   --   --   CO2 15*  --   --   --   --   GLUCOSE 184*  --   --   --   --   BUN 73*  --   --   --   --   CREATININE 3.72*  --   --   --   --   CALCIUM 8.1*  --   --   --   --   MG  --  1.7  --   --   --   PHOS  --   --   --  5.0*  --    Liver Function Tests: Recent Labs  Lab 05/27/21 0501  AST 107*  ALT 70*  ALKPHOS 165*  BILITOT 1.8*  PROT 4.6*  ALBUMIN 2.0*   No results for input(s): LIPASE, AMYLASE in the last 168 hours. No results for input(s): AMMONIA in the last 168 hours. CBC: Recent Labs  Lab 2021/05/27 0501 05-27-21 1041 May 27, 2021 1410  WBC 11.9*  --   --   NEUTROABS 9.5*  --   --   HGB 10.5* 10.2* 9.9*  HCT 31.4* 30.0* 29.0*  MCV 98.7  --   --   PLT 31*  --   --    Cardiac Enzymes: No results for input(s): CKTOTAL, CKMB, CKMBINDEX, TROPONINI in the last 168 hours. Sepsis Labs: Recent Labs  Lab 05-27-21 0501 05-27-21 0530 2021/05/27 1123 2021-05-27 1147  PROCALCITON  --   --   --  25.43  WBC 11.9*  --   --   --   LATICACIDVEN  --  7.6* 7.6*  --     Candee Furbish 05/10/2021, 5:07 PM

## 2021-05-26 NOTE — Progress Notes (Signed)
Remote ICD transmission.   

## 2021-05-26 NOTE — Progress Notes (Signed)
Received pt from [facility] via [facility]. Upon arrival, pt lost pulse and code blue was initiated. CCM at bedside. ROSC obtained. See Code Sheet.

## 2021-05-26 NOTE — ED Notes (Signed)
Date and time results received: 06/04/2021 0605  Test: pH, Arterial  Critical Value: 7.098  Name of Provider Notified: Dr. Bebe Shaggy  Orders Received? Or Actions Taken?: Acknowledged

## 2021-05-26 NOTE — ED Notes (Signed)
Levophed increased to 10.

## 2021-05-26 NOTE — ED Notes (Signed)
Levophed started at 5 mcg/min

## 2021-05-26 NOTE — H&P (Signed)
NAME:  [name], MRN:  [id number], DOB:  [birth year], LOS: 0 ADMISSION DATE:  05-11-21, CONSULTATION DATE:  05/11/21 REFERRING MD:  EDP CHIEF COMPLAINT:  Cardiac Arrest    History of Present Illness:  Mr. [name] is a 65 y/o male with a PMHx of CAD s/p STEMI (2010, 2019), CABG (2012), ischemic cardiomyopathy, HFrEF s/p AICD placement complicated by MSSA bacteremia who presents to the ED for cardiac arrest.   Per EMS report, patient has been experiencing sore throat, cough, chills, body aches, headache and vomiting for the past week. He tested negative for COVID-19. Then 2 days ago, patient began experiencing muscle cramps. Last 24 hours, he developed slurred speech with hallucinations. On arrival to the ED, patient was noted to be pulseless. He received 12 minutes of CPR with epi, dextrose and Narcan. No shocks.   Pertinent  Medical History  CAD complicated by STEMI (2010, 2019), CABG (2012)  HFrEF (ischemic) - AICD placed  HLD   Significant Hospital Events: Including procedures, antibiotic start and stop dates in addition to other pertinent events   9/14: 12 minute cardiac arrest at [facility]. Intubated. Transferred to [facility].   Interim History / Subjective:   On arrival to [facility], patient was noted to be in PEA arrest. He underwent 5 minutes of CPR without shock. Epinephrine was administered.    Objective   Blood pressure (!) 79/58, pulse (!) 111, temperature (!) 97.3 F (36.3 C), resp. rate (!) 29, height 5\' 7"  (1.702 m), weight 73.4 kg, SpO2 99 %.    Vent Mode: PRVC FiO2 (%):  [50 %-100 %] 50 % Set Rate:  [18 bmp-30 bmp] 30 bmp Vt Set:  [530 mL-540 mL] 530 mL PEEP:  [0 cmH20-5 cmH20] 5 cmH20 Plateau Pressure:  [20 cmH20-23 cmH20] 20 cmH20   Intake/Output Summary (Last 24 hours) at 2021-05-11 1130 Last data filed at 11-May-2021 0948 Gross per 24 hour  Intake 2000 ml  Output 0 ml  Net 2000 ml   Filed Weights   05-11-2021 0510 05/11/2021 1100  Weight: 69.4 kg 73.4 kg    Examination: General: Chronically ill appearing male. Appears older than stated age.  HENT: ETT and NGT in place. Oropharynx clear otherwise.  Lungs: Clear to auscultation bilaterally. Symmetrical chest movement.  Cardiovascular: Irregularly irregular with tachycardia. No murmurs.  Abdomen: Soft and non-distended. Hypoactive bowel sounds.  Extremities: No pitting edema or gross abnormalities. No rashes or lesions.  Neuro: Sedated and obtunded. Gag reflex absent. On re-examination, patient opens eyes but does not follow commands.   Resolved Hospital Problem list   N/A  Assessment & Plan:   Cardiac Arrest  - In the setting of sepsis. Suspect PEA arrest as no shocks administered. ROSC obtained after 12 minutes. Repeat PEA arrest on [facility] arriva - ROSC obtained after 5 minutes.  - No indication for TTM - Troponin pending   Septic Shock   - Reportedly ill for the past week with upper respiratory symptoms.  - Source for infection at this time undetermined. CXR on admission unremarkable.  - Blood cultures negative x 12 hours - continue to follow.  - Continue broad spectrum antibiotics - Vancomycin, Cefepime, Metronidazole  - Continue Levophed, Vasopressin, Epinephrine to maintain MAP > 65.   Acute Hypoxic Respiratory Failure  - In the setting of cardiac arrest and encephalopathy  - Full vent support - Daily SBT if mental status improves - VAP measures in place   Acute Encephalopathy  - Wife reported AMS prior to  arrest in the setting of illness. Differential includes meningitis/encephalitis, however not stable enough for MRI at this time.  - CT Head negative  - EEG will be obtained once patient is stabilized.   Severe Thrombocytopenia  Prolonged PT/PTT/INR  - Possible DIC in the setting of multi-organ failure and cardiac arrest.  - Peripheral smear pending  - Trend platelets  - Transfuse platelets if count decreases < 10  Severe Metabolic Acidosis: Secondary to lactic acidosis.  pH on admission 7.0 with CO of 15. Improving with multiple bicarb pushes and initiation of bicarb gtt.  - Continue bicarb gtt, increase to 200 cc/hr  Acute Kidney Injury: Likely ATN.Cr of 3.72 on admission.  Hyponatremia  - No indication for emergent CRRT at thsi time  - s/p IVF resuscitation with 4 L - Trend renal indexes - Strict in/outs   CAD  HFrEF  - Hold home anti-hypertensives - TTE ordered   Best Practice (right click and "Reselect all SmartList Selections" daily)   Diet/type: NPO DVT prophylaxis: prophylactic heparin  GI prophylaxis: PPI Lines: Central line, Arterial Line, and yes and it is still needed Foley:  Yes, and it is still needed Code Status:  full code Last date of multidisciplinary goals of care discussion [N/A - awaiting wife's arrival ]  Labs   CBC: Recent Labs  Lab May 15, 2021 0501 May 15, 2021 1041  WBC 11.9*  --   NEUTROABS 9.5*  --   HGB 10.5* 10.2*  HCT 31.4* 30.0*  MCV 98.7  --   PLT 31*  --     Basic Metabolic Panel: Recent Labs  Lab 15-May-2021 0501 05/15/21 0524 2021-05-15 1041  NA 128*  --  130*  K 4.4  --  3.4*  CL 96*  --   --   CO2 15*  --   --   GLUCOSE 184*  --   --   BUN 73*  --   --   CREATININE 3.72*  --   --   CALCIUM 8.1*  --   --   MG  --  1.7  --    GFR: Estimated Creatinine Clearance: 19 mL/min (A) (by C-G formula based on SCr of 3.72 mg/dL (H)). Recent Labs  Lab 15-May-2021 0501 May 15, 2021 0530  WBC 11.9*  --   LATICACIDVEN  --  7.6*    Liver Function Tests: Recent Labs  Lab 15-May-2021 0501  AST 107*  ALT 70*  ALKPHOS 165*  BILITOT 1.8*  PROT 4.6*  ALBUMIN 2.0*   No results for input(s): LIPASE, AMYLASE in the last 168 hours. No results for input(s): AMMONIA in the last 168 hours.  ABG    Component Value Date/Time   PHART 7.275 (L) May 15, 2021 1041   PCO2ART 45.6 2021-05-15 1041   PO2ART 455 (H) 15-May-2021 1041   HCO3 21.2 05/15/21 1041   TCO2 23 05/15/21 1041   ACIDBASEDEF 6.0 (H) 05/15/2021 1041    O2SAT 100.0 05/15/21 1041     Coagulation Profile: Recent Labs  Lab 05-15-2021 0501  INR 1.5*    Cardiac Enzymes: No results for input(s): CKTOTAL, CKMB, CKMBINDEX, TROPONINI in the last 168 hours.  HbA1C: Hgb A1c MFr Bld  Date/Time Value Ref Range Status  03/16/2009 08:35 AM  4.6 - 6.1 % Final   5.2 (NOTE) The ADA recommends the following therapeutic goal for glycemic control related to Hgb A1c measurement: Goal of therapy: <6.5 Hgb A1c  Reference: American Diabetes Association: Clinical Practice Recommendations 2010, Diabetes Care, 2010, 33: (Suppl  1).  08/27/2008 04:07 AM  4.6 - 6.1 % Final   5.7 (NOTE)   The ADA recommends the following therapeutic goal for glycemic   control related to Hgb A1C measurement:   Goal of Therapy:   < 7.0% Hgb A1C   Reference: American Diabetes Association: Clinical Practice   Recommendations 2008, Diabetes Care,  2008, 31:(Suppl 1).    CBG: Recent Labs  Lab 05/21/21 0535 21-May-2021 1102  GLUCAP 179* 129*    Review of Systems:   Negative except as noted above.   Past Medical History:  He,  has a past medical history of Adrenal mass (HCC), Anemia, CAD (coronary artery disease), Cardiac arrest - ventricular fibrillation (09/02/2008), History of methicillin resistant staphylococcus aureus (MRSA), HL (hearing loss), Hyperlipidemia, Hypertension, Ischemic cardiomyopathy, Pericarditis, and S/P CABG (coronary artery bypass graft) (Sept 2012).   Surgical History:   Past Surgical History:  Procedure Laterality Date   ANKLE SURGERY     CARDIAC CATHETERIZATION  08/26/2008   Est. EF of 45% -- Successful intracoronary stenting of the mid-LAD -- Three-vessel atherosclerotic coronary artery disease.  The patient has occlusion of the mid LAD, which is his culprit lesion.  He has  moderate disease in the proximal circumflex and PDA -- Moderate left ventricular dysfunction -- [name], [name]   CORONARY ARTERY BYPASS GRAFT  05/07/2011   CABG x3:  LIMA  to LAD, SVG to OM2, SVG to PDA   CORONARY STENT INTERVENTION N/A 10/15/2017   Procedure: CORONARY STENT INTERVENTION;  Surgeon: [place], Peter M, MD;  Location: [facility] INVASIVE CV LAB;  Service: Cardiovascular;  Laterality: N/A;   ICD implant  02/23/2009    with subsequent extraction for MSSA bacteremia   ICD reimplantation  05/09/2009   status post implant of a [employer] Maximo II DR dual- chamber cardioverter defibrillator by Dr. Hillis Range   LEAD REVISION Left 03/05/2013   RV lead fracture.  A new [employer] [phone] lead was placed   LEFT HEART CATH AND CORS/GRAFTS ANGIOGRAPHY N/A 10/15/2017   Procedure: LEFT HEART CATH AND CORS/GRAFTS ANGIOGRAPHY;  Surgeon: [place], Peter M, MD;  Location: [facility] INVASIVE CV LAB;  Service: Cardiovascular;  Laterality: N/A;     Social History:   reports that he has been smoking cigarettes. He has never used smokeless tobacco. He reports current drug use. Drug: Marijuana. He reports that he does not drink alcohol.   Family History:  His family history includes Coronary artery disease in his father; Stroke in his mother.   Allergies Allergies  Allergen Reactions   Sacubitril-Valsartan    Morphine And Related Itching     Home Medications  Prior to Admission medications   Medication Sig Start Date End Date Taking? Authorizing Provider  aspirin EC 81 MG tablet Take 1 tablet (81 mg total) by mouth daily. 02/07/17   [place], Peter M, MD  atorvastatin (LIPITOR) 80 MG tablet TAKE (1) TABLET BY MOUTH AT BEDTIME. 02/12/21   [place], Peter M, MD  carvedilol (COREG) 12.5 MG tablet Take 1 tablet (12.5 mg total) by mouth 2 (two) times daily. 02/09/21   Allred, Fayrene Fearing, MD  carvedilol (COREG) 25 MG tablet TAKE (1) TABLET BY MOUTH TWICE DAILY. 03/09/21   [place], Peter M, MD  nitroGLYCERIN (NITROSTAT) 0.4 MG SL tablet Place 1 tablet (0.4 mg total) under the tongue every 5 (five) minutes x 3 doses as needed for chest pain. IF NEEDED 12/08/13   [place], Peter M, MD  ramipril  (ALTACE) 10 MG capsule TAKE (1) CAPSULE  BY MOUTH TWICE DAILY. 03/16/21   [place], Peter M, MD  spironolactone (ALDACTONE) 25 MG tablet TAKE 1 TABLET BY MOUTH ONCE A DAY. 02/12/21   [place], Peter M, MD    Dr. Verdene Lennert Internal Medicine PGY-3 05/12/21, 11:30 AM

## 2021-05-26 NOTE — Progress Notes (Signed)
Spoke with MD, pt too unstable for EEG at this time; will call us if needed.

## 2021-05-26 NOTE — Progress Notes (Signed)
ETT advance from 21cm to 25cm at lip per MD

## 2021-05-26 NOTE — Progress Notes (Signed)
Pt transported to ct scan no complications

## 2021-05-26 NOTE — ED Provider Notes (Signed)
[facility] EMERGENCY DEPARTMENT Provider Note   CSN: [id number] Arrival date & time: 05/12/2021  0445     History Chief complaint - unresponsive  Level 5 caveat due to unresponsive/acuity of condition  [name] is a 65 y.o. male.  The history is provided by the spouse. The history is limited by the condition of the patient.  Altered Mental Status Presenting symptoms: unresponsiveness   Severity:  Severe Most recent episode:  Today Episode history:  Single Timing:  Constant Progression:  Worsening Chronicity:  New Patient presents from home for altered mental status.  Patient arrived unresponsive, was brought immediately to the room and was found to have no pulse and CPR was started immediately No other details are known on arrival    Past Medical History:  Diagnosis Date   Adrenal mass (HCC)    currently being evaluated -- 4.1 cm right adrenal mass   Anemia    CAD (coronary artery disease)    2000-STEMI, DES to LAD, 1wk later VF arrest d/t 50% ISR, 2012- CABG X3, 10/16/17 STEMI with VT-, LHC patnet graft, DES to 1st OM & Circ   Cardiac arrest - ventricular fibrillation 09/02/2008   a. s/p MDT dual chamber ICD   History of methicillin resistant staphylococcus aureus (MRSA)    History of methicillin-sensitive Staphylococcus aureus bacteremia  with implantable cardioverter-defibrillator explant, February 23, 2009   HL (hearing loss)    Hyperlipidemia    Hypertension    Ischemic cardiomyopathy    severe ischemic cardiomyopathy -- ejection fraction 30-35%   Pericarditis    history of acute pericarditis in July 2010 now resolved   S/P CABG (coronary artery bypass graft) Sept 2012    Patient Active Problem List   Diagnosis Date Noted   Drug-induced skin rash 10/24/2017   NSTEMI (non-ST elevated myocardial infarction) (HCC) 10/15/2017   Acute myocardial infarction, subendocardial infarction, initial episode of care (HCC) 03/04/2013   Implantable  cardioverter-defibrillator (ICD) in situ 06/24/2012   S/P CABG x 3 05/31/2011   Adrenal mass (HCC)    METHICILLIN SUSCEPTIBLE STAPH AUREUS SEPTICEMIA 04/27/2009   HYPERCHOLESTEROLEMIA 12/14/2008   HYPOMAGNESEMIA 12/14/2008   HYPOKALEMIA 12/14/2008   TOBACCO ABUSE 12/14/2008   ENCEPHALOPATHY, ANOXIC 12/14/2008   HYPERTENSION 12/14/2008   CAD (coronary artery disease) 12/14/2008   VENTRICULAR TACHYCARDIA 12/14/2008   Cardiac arrest (HCC) 12/14/2008   CHF 12/14/2008   Chronic systolic CHF (congestive heart failure) (HCC) 12/14/2008   UNSPECIFIED CARDIOVASCULAR DISEASE 12/14/2008    Past Surgical History:  Procedure Laterality Date   ANKLE SURGERY     CARDIAC CATHETERIZATION  08/26/2008   Est. EF of 45% -- Successful intracoronary stenting of the mid-LAD -- Three-vessel atherosclerotic coronary artery disease.  The patient has occlusion of the mid LAD, which is his culprit lesion.  He has  moderate disease in the proximal circumflex and PDA -- Moderate left ventricular dysfunction -- [name], [name]   CORONARY ARTERY BYPASS GRAFT  05/07/2011   CABG x3:  LIMA to LAD, SVG to OM2, SVG to PDA   CORONARY STENT INTERVENTION N/A 10/15/2017   Procedure: CORONARY STENT INTERVENTION;  Surgeon: [place], Peter M, MD;  Location: [facility] INVASIVE CV LAB;  Service: Cardiovascular;  Laterality: N/A;   ICD implant  02/23/2009    with subsequent extraction for MSSA bacteremia   ICD reimplantation  05/09/2009   status post implant of a Medtronic Maximo II DR dual- chamber cardioverter defibrillator by Dr. Hillis Range   LEAD REVISION Left 03/05/2013  RV lead fracture.  A new [employer] [phone] lead was placed   LEFT HEART CATH AND CORS/GRAFTS ANGIOGRAPHY N/A 10/15/2017   Procedure: LEFT HEART CATH AND CORS/GRAFTS ANGIOGRAPHY;  Surgeon: [place], Peter M, MD;  Location: [facility] INVASIVE CV LAB;  Service: Cardiovascular;  Laterality: N/A;       Family History  Problem Relation Age of Onset   Stroke Mother     Coronary artery disease Father     Social History   Tobacco Use   Smoking status: Every Day    Years: 37.00    Types: Cigarettes    Last attempt to quit: 10/14/2017    Years since quitting: 3.5   Smokeless tobacco: Never   Tobacco comments:    1 pack per week. Has been smoking for 35 years.   Vaping Use   Vaping Use: Never used  Substance Use Topics   Alcohol use: No   Drug use: Yes    Types: Marijuana    Home Medications Prior to Admission medications   Medication Sig Start Date End Date Taking? Authorizing Provider  aspirin EC 81 MG tablet Take 1 tablet (81 mg total) by mouth daily. 02/07/17   [place], Peter M, MD  atorvastatin (LIPITOR) 80 MG tablet TAKE (1) TABLET BY MOUTH AT BEDTIME. 02/12/21   [place], Peter M, MD  carvedilol (COREG) 12.5 MG tablet Take 1 tablet (12.5 mg total) by mouth 2 (two) times daily. 02/09/21   Allred, Fayrene Fearing, MD  carvedilol (COREG) 25 MG tablet TAKE (1) TABLET BY MOUTH TWICE DAILY. 03/09/21   [place], Peter M, MD  nitroGLYCERIN (NITROSTAT) 0.4 MG SL tablet Place 1 tablet (0.4 mg total) under the tongue every 5 (five) minutes x 3 doses as needed for chest pain. IF NEEDED 12/08/13   [place], Peter M, MD  ramipril (ALTACE) 10 MG capsule TAKE (1) CAPSULE BY MOUTH TWICE DAILY. 03/16/21   [place], Peter M, MD  spironolactone (ALDACTONE) 25 MG tablet TAKE 1 TABLET BY MOUTH ONCE A DAY. 02/12/21   [place], Peter M, MD    Allergies    Sacubitril-valsartan and Morphine and related  Review of Systems   Review of Systems  Unable to perform ROS: Patient unresponsive   Physical Exam Updated Vital Signs BP (!) 86/72   Pulse (!) 31   Temp (!) 95 F (35 C)   Resp (!) 31   Ht 1.702 m (5\' 7" )   Wt 69.4 kg   SpO2 99%   BMI 23.96 kg/m   Physical Exam CONSTITUTIONAL: Ill-appearing, unresponsive HEAD: Normocephalic/atraumatic, no visible trauma EYES: Pupils equal bilaterally, not responsive to light ENMT: Copious vomit in the mouth NECK: supple no meningeal  signs CV: No spontaneous cardiac activity LUNGS: Equal breath sounds with bagging ABDOMEN: soft, nondistended GU: Normal external genitalia, nurse present for exam NEURO: Pt is unresponsive EXTREMITIES: No deformities SKIN: Cool to touch PSYCH: Unresponsive, unable to assess ED Results / Procedures / Treatments   Labs (all labs ordered are listed, but only abnormal results are displayed) Labs Reviewed  BASIC METABOLIC PANEL - Abnormal; Notable for the following components:      Result Value   Sodium 128 (*)    Chloride 96 (*)    CO2 15 (*)    Glucose, Bld 184 (*)    BUN 73 (*)    Creatinine, Ser 3.72 (*)    Calcium 8.1 (*)    GFR, Estimated 17 (*)    Anion gap 17 (*)    All other  components within normal limits  BLOOD GAS, ARTERIAL - Abnormal; Notable for the following components:   pH, Arterial 7.098 (*)    pO2, Arterial 367 (*)    Bicarbonate 12.2 (*)    Acid-base deficit 15.2 (*)    All other components within normal limits  SALICYLATE LEVEL - Abnormal; Notable for the following components:   Salicylate Lvl <7.0 (*)    All other components within normal limits  ACETAMINOPHEN LEVEL - Abnormal; Notable for the following components:   Acetaminophen (Tylenol), Serum <10 (*)    All other components within normal limits  HEPATIC FUNCTION PANEL - Abnormal; Notable for the following components:   Total Protein 4.6 (*)    Albumin 2.0 (*)    AST 107 (*)    ALT 70 (*)    Alkaline Phosphatase 165 (*)    Total Bilirubin 1.8 (*)    Bilirubin, Direct 1.0 (*)    All other components within normal limits  PROTIME-INR - Abnormal; Notable for the following components:   Prothrombin Time 18.5 (*)    INR 1.5 (*)    All other components within normal limits  APTT - Abnormal; Notable for the following components:   aPTT 40 (*)    All other components within normal limits  LACTIC ACID, PLASMA - Abnormal; Notable for the following components:   Lactic Acid, Venous 7.6 (*)    All  other components within normal limits  CBG MONITORING, ED - Abnormal; Notable for the following components:   Glucose-Capillary 179 (*)    All other components within normal limits  RESP PANEL BY RT-PCR (FLU A&B, COVID) ARPGX2  URINE CULTURE  CULTURE, BLOOD (ROUTINE X 2)  CULTURE, BLOOD (ROUTINE X 2)  ETHANOL  MAGNESIUM  CBC WITH DIFFERENTIAL/PLATELET  RAPID URINE DRUG SCREEN, HOSP PERFORMED  URINALYSIS, ROUTINE W REFLEX MICROSCOPIC  LACTIC ACID, PLASMA  TROPONIN I (HIGH SENSITIVITY)    EKG EKG Interpretation  Date/Time:  Wednesday 05-10-21 05:04:54 EDT Ventricular Rate:  104 PR Interval:    QRS Duration: 120 QT Interval:  349 QTC Calculation: 459 R Axis:   91 Text Interpretation: Atrial fibrillation Nonspecific intraventricular conduction delay Confirmed by Zadie Rhine ([id number]) on May 10, 2021 5:13:31 AM  Radiology DG Chest Portable 1 View  Result Date: May 10, 2021 CLINICAL DATA:  Cardiac arrest EXAM: PORTABLE CHEST 1 VIEW COMPARISON:  02/07/2021 FINDINGS: Normal heart size and mediastinal contours. CABG and coronary stenting. Dual-chamber pacing/defibrillator leads from the right. Endotracheal tube with tip between the clavicular heads and carina. The enteric tube reaches the stomach. Artifact from defibrillator and EKG pads. There is no edema, consolidation, effusion, or pneumothorax. No detected fracture IMPRESSION: Unremarkable hardware and stable chest radiograph. Electronically Signed   By: Tiburcio Pea [name].   On: 2021/05/10 05:30    Procedures Procedure Name: Intubation Date/Time: 05-10-2021 5:00 AM Performed by: Zadie Rhine, MD Pre-anesthesia Checklist: Patient identified, Patient being monitored and Suction available Oxygen Delivery Method: Ambu bag Preoxygenation: Pre-oxygenation with 100% oxygen Ventilation: Mask ventilation without difficulty Laryngoscope Size: Glidescope Grade View: Grade I Number of attempts: 1 Airway Equipment and Method:  Video-laryngoscopy Placement Confirmation: ETT inserted through vocal cords under direct vision, CO2 detector and Breath sounds checked- equal and bilateral Secured at: 24 cm Tube secured with: ETT holder Comments: Copious follow-up was noted in mouth on initial evaluation, this was suctioned vigorously prior to intubation    .Critical Care Performed by: Zadie Rhine, MD Authorized by: Zadie Rhine, MD   Critical care provider  statement:    Critical care time (minutes):  60   Critical care start time:  2021/05/18 5:30 AM   Critical care end time:  May 18, 2021 6:30 AM   Critical care time was exclusive of:  Separately billable procedures and treating other patients   Critical care was necessary to treat or prevent imminent or life-threatening deterioration of the following conditions:  Cardiac failure, respiratory failure, CNS failure or compromise, shock and sepsis   Critical care was time spent personally by me on the following activities:  Development of treatment plan with patient or surrogate, discussions with consultants, evaluation of patient's response to treatment, examination of patient, obtaining history from patient or surrogate, ventilator management, review of old charts, re-evaluation of patient's condition, pulse oximetry, ordering and review of radiographic studies, ordering and review of laboratory studies and ordering and performing treatments and interventions   I assumed direction of critical care for this patient from another provider in my specialty: no     Care discussed with: admitting provider   .Central Line  Date/Time: 2021-05-18 6:30 AM Performed by: Zadie Rhine, MD Authorized by: Zadie Rhine, MD   Consent:    Consent obtained:  Emergent situation   Consent given by:  Spouse   Risks, benefits, and alternatives were discussed: yes     Risks discussed:  Bleeding Universal protocol:    Patient identity confirmed:  Provided demographic  data Pre-procedure details:    Indication(s): central venous access and insufficient peripheral access     Hand hygiene: Hand hygiene performed prior to insertion     Sterile barrier technique: All elements of maximal sterile technique followed     Skin preparation:  Chlorhexidine   Skin preparation agent: Skin preparation agent completely dried prior to procedure   Anesthesia:    Anesthesia method:  Local infiltration   Local anesthetic:  Lidocaine 1% w/o epi Procedure details:    Location:  R femoral   Site selection rationale:  Patient with right ICD in place   Patient position:  Trendelenburg   Procedural supplies:  Triple lumen   Catheter size:  7 Fr   Landmarks identified: yes     Ultrasound guidance: yes     Ultrasound guidance timing: real time     Sterile ultrasound techniques: Sterile gel and sterile probe covers were used     Number of attempts:  3   Successful placement: yes   Post-procedure details:    Post-procedure:  Dressing applied and line sutured   Assessment:  Blood return through all ports and free fluid flow   Procedure completion:  Tolerated well, no immediate complications    CPR Procedure Note I PERSONALLY DIRECTED ANCILLARY STAFF OR/PERFORMED CPR IN AN EFFORT TO REGAIN RETURN OF SPONTANEOUS CIRCULATION IN AN EFFORT TO MAINTAIN NEURO, CARDIAC AND SYSTEMIC PERFUSION   Medications Ordered in ED Medications  fentaNYL (SUBLIMAZE) injection 50 mcg (has no administration in time range)  fentaNYL (SUBLIMAZE) injection 50 mcg (has no administration in time range)  midazolam (VERSED) injection 1 mg (has no administration in time range)  midazolam (VERSED) injection 1 mg (has no administration in time range)  0.9 %  sodium chloride infusion (0 mLs Intravenous Stopped May 18, 2021 0601)  lactated ringers infusion (has no administration in time range)  lactated ringers bolus 1,000 mL (1,000 mLs Intravenous New Bag/Given May 18, 2021 0607)    And  lactated ringers bolus  1,000 mL (1,000 mLs Intravenous New Bag/Given 05/18/2021 0607)    And  lactated ringers bolus 250  mL (has no administration in time range)  vancomycin (VANCOCIN) IVPB 1000 mg/200 mL premix (has no administration in time range)  vancomycin (VANCOREADY) IVPB 500 mg/100 mL (has no administration in time range)  vancomycin variable dose per unstable renal function (pharmacist dosing) (has no administration in time range)  ceFEPIme (MAXIPIME) 2 g in sodium chloride 0.9 % 100 mL IVPB (has no administration in time range)  norepinephrine (LEVOPHED) 4-5 MG/250ML-% infusion SOLN (has no administration in time range)  norepinephrine (LEVOPHED) 4mg  in premix infusion (has no administration in time range)  vasopressin (PITRESSIN) 20 Units in sodium chloride 0.9 % 100 mL infusion-*FOR SHOCK* (has no administration in time range)  EPINEPHrine (ADRENALIN) 1 MG/10ML injection (1 mg Intravenous Given May 22, 2021 0457)  naloxone (NARCAN) injection (2 mg Intravenous Given 05-22-21 0455)  dextrose 50 % solution (1 ampule Intravenous Given 05-22-21 0455)  EPINEPHrine (ADRENALIN) 1 MG/10ML injection (1 mg Intravenous Given 22-May-2021 0458)  lactated ringers bolus 1,000 mL (0 mLs Intravenous Stopped 05/22/21 0718)  ceFEPIme (MAXIPIME) 2 g in sodium chloride 0.9 % 100 mL IVPB (2 g Intravenous New Bag/Given 05/22/21 0618)  metroNIDAZOLE (FLAGYL) IVPB 500 mg (500 mg Intravenous New Bag/Given 22-May-2021 0617)    ED Course  I have reviewed the triage vital signs and the nursing notes.  Pertinent labs & imaging results that were available during my care of the patient were reviewed by me and considered in my medical decision making (see chart for details).    MDM Rules/Calculators/A&P                           I was called to the patient's room on arrival.  Patient was noted to be unresponsive without a pulse.  I started CPR immediately.  Bag-valve-mask was applied until we were able to set up for intubation. CPR was  continued for several minutes and he received multiple rounds of epinephrine, Narcan and dextrose. After intubation patient regained spontaneous circulation. He did require copious suction of dark vomit from his mouth  After patient was stabilized and intubated, I had a conversation with his wife. Patient has extensive history including CAD, V. tach with ICD in place. She reports that last week he felt like he had a flulike illness and was seen in outside hospital and had a negative COVID test.  He is not vaccinated for COVID. Since that time his course is worsened.  He is continue to have symptoms, and she reports over the past 24 hours he has appeared more confused.  She reports that he has been "talking like he is drunk " She reports tonight she woke up around 330 and he was in the shower.  He had already started a pot of coffee and reported he was going to work.  He then asked his wife to take him to the hospital.  She reports that he was speaking to her on the car ride to the hospital, but then became more somnolent on arrival. She reports he has not on any chronic pain medications, does not drink alcohol.  No recent travel. He has not been reporting any chest pain and no ICD shocks are reported Labs and imaging are pending at this time. 6:14 AM ICD report-prelim report reveals no dysrhythmias or obvious malfunction Lactate elevated, patient now hypotensive.  Code sepsis has been called.  Will place central line Received emergent consent from his wife Discussed with Dr. Tonia Brooms for admission to critical care  7:21 AM Central line placed for further vascular access.  Patient now becoming profoundly hypotensive.  He has been given Levophed and vasopressin Patient is awaiting transfer to [facility] in critical condition.  Wife and family are updated on condition of patient Final Clinical Impression(s) / ED Diagnoses Final diagnoses:  Cardiac arrest (HCC)  Acute respiratory failure with  hypoxia (HCC)  AKI (acute kidney injury) (HCC)  Metabolic acidosis    Rx / DC Orders ED Discharge Orders     None        Zadie Rhine, MD 06/02/2021 [phone]

## 2021-05-26 NOTE — Procedures (Signed)
Cardiopulmonary Resuscitation Note  [name]  [id number]  Feb 07, 1956  Date:05-21-21  Time:10:42 AM   Provider Performing:Bryant Lipps Erby Pian   Procedure: Cardiopulmonary Resuscitation [id number])  Indication(s) Loss of Pulse  Consent N/A  Anesthesia N/A   Time Out N/A   Sterile Technique Hand hygiene, gloves   Procedure Description Called to patient's room for CODE BLUE. Initial rhythm was PEA/Asystole. Patient received high quality chest compressions for 5 minutes with defibrillation or cardioversion when appropriate. Epinephrine was administered every 3 minutes as directed by time Biomedical engineer. Additional pharmacologic interventions included sodium bicarbonate. Additional procedural interventions include bedside echo.  Return of spontaneous circulation was achieved.  Family unable to be notified.  Called and left VM   Complications/Tolerance N/A   EBL N/A   Specimen(s) N/A  Estimated time to ROSC: 

## 2021-05-26 NOTE — Progress Notes (Signed)
Pharmacy Antibiotic Note  [name] is a 65 y.o. male admitted on 2021/05/18 with  altered mental status and unresponsive on presentation to [facility] .  Pharmacy has been consulted for vancomycin and cefepime dosing.  Patient received CPR on arrival, hypothermic in ED. Wbc count slightly elevated at 11.9, scr elevated at 3.7(baseline ~1). Given AKI on admission will order loading dose of vancomycin and follow renal function and random levels for redosing.   Plan: Vancomycin 1500mg  IV x1 load then follow random levels for redosing Cefepime 2g q24 hours  Height: 5\' 7"  (170.2 cm) Weight: 69.4 kg (153 lb) IBW/kg (Calculated) : 66.1  Temp (24hrs), Avg:93 F (33.9 C), Min:86.5 F (30.3 C), Max:95.2 F (35.1 C)  Recent Labs  Lab 05-18-21 0501 18-May-2021 0530  CREATININE 3.72*  --   LATICACIDVEN  --  7.6*    Estimated Creatinine Clearance: 19 mL/min (A) (by C-G formula based on SCr of 3.72 mg/dL (H)).    Allergies  Allergen Reactions   Sacubitril-Valsartan    Morphine And Related Itching    05/11/21 PharmD., BCPS Clinical Pharmacist 2021-05-18 6:51 AM

## 2021-05-26 NOTE — Progress Notes (Signed)
PEEP d/c due to drop in pt BP

## 2021-05-26 NOTE — Progress Notes (Signed)
Received verbal order from Dr. Katrinka Blazing to titrate morphine per comfort care measures up to 56ml/hr for RR parameters. Dr. Katrinka Blazing paged for emergency, pt gasping for air once extubated. Contacted pharmacy to update order as this RN could not find correct order set.

## 2021-05-26 NOTE — Procedures (Signed)
Arterial Catheter Insertion Procedure Note  [name]  [id number]  1956/05/27  Date:05/25/2021  Time:11:15 AM    Provider Performing: Verdene Lennert    Procedure: Insertion of Arterial Line (74081) with US guidance ([id number])   Indication(s) Blood pressure monitoring and/or need for frequent ABGs  Consent Unable to obtain consent due to emergent nature of procedure.  Anesthesia None   Time Out Verified patient identification, verified procedure, site/side was marked, verified correct patient position, special equipment/implants available, medications/allergies/relevant history reviewed, required imaging and test results available.   Sterile Technique Maximal sterile technique including full sterile barrier drape, hand hygiene, sterile gown, sterile gloves, mask, hair covering, sterile ultrasound probe cover (if used).   Procedure Description Area of catheter insertion was cleaned with chlorhexidine and draped in sterile fashion. With real-time ultrasound guidance an arterial catheter was placed into the left femoral artery.  Appropriate arterial tracings confirmed on monitor.     Complications/Tolerance None; patient tolerated the procedure well.   EBL Minimal   Specimen(s) None

## 2021-05-26 NOTE — Sepsis Progress Note (Signed)
Pt has transferred from [facility] to [facility].  RN made aware of this pt on code sepsis protocol and of need to repeat Lactic acid  when able

## 2021-05-26 NOTE — ED Notes (Signed)
Levophed increased to 20

## 2021-05-26 DEATH — deceased

## 2021-08-28 ENCOUNTER — Encounter: Payer: Medicare Other | Admitting: Student
